# Patient Record
Sex: Female | Born: 1935 | Race: White | Hispanic: No | Smoking: Never smoker
Health system: Southern US, Community
[De-identification: ages and names within clinical notes are randomized; demographics above are authoritative.]

## PROBLEM LIST (undated history)

## (undated) DIAGNOSIS — K219 Gastro-esophageal reflux disease without esophagitis: Secondary | ICD-10-CM

## (undated) DIAGNOSIS — K6389 Other specified diseases of intestine: Secondary | ICD-10-CM

## (undated) DIAGNOSIS — C187 Malignant neoplasm of sigmoid colon: Principal | ICD-10-CM

## (undated) DIAGNOSIS — M533 Sacrococcygeal disorders, not elsewhere classified: Secondary | ICD-10-CM

## (undated) DIAGNOSIS — N329 Bladder disorder, unspecified: Secondary | ICD-10-CM

## (undated) DIAGNOSIS — K5792 Diverticulitis of intestine, part unspecified, without perforation or abscess without bleeding: Secondary | ICD-10-CM

## (undated) DIAGNOSIS — M5136 Other intervertebral disc degeneration, lumbar region: Secondary | ICD-10-CM

## (undated) DIAGNOSIS — M5416 Radiculopathy, lumbar region: Secondary | ICD-10-CM

## (undated) DIAGNOSIS — M47816 Spondylosis without myelopathy or radiculopathy, lumbar region: Secondary | ICD-10-CM

## (undated) DIAGNOSIS — Z96641 Presence of right artificial hip joint: Secondary | ICD-10-CM

## (undated) DIAGNOSIS — G4700 Insomnia, unspecified: Secondary | ICD-10-CM

## (undated) HISTORY — PX: HIP SURGERY: SHX245

## (undated) HISTORY — DX: Radiculopathy, lumbar region: M54.16

## (undated) HISTORY — DX: Gastro-esophageal reflux disease without esophagitis: K21.9

## (undated) HISTORY — DX: Bladder disorder, unspecified: N32.9

## (undated) HISTORY — DX: Diverticulitis of intestine, part unspecified, without perforation or abscess without bleeding: K57.92

## (undated) HISTORY — DX: Malignant neoplasm of sigmoid colon: C18.7

## (undated) HISTORY — DX: Other specified diseases of intestine: K63.89

## (undated) HISTORY — DX: Spondylosis without myelopathy or radiculopathy, lumbar region: M47.816

## (undated) HISTORY — DX: Insomnia, unspecified: G47.00

## (undated) HISTORY — DX: Sacrococcygeal disorders, not elsewhere classified: M53.3

## (undated) HISTORY — PX: LUNG SURGERY: SHX703

## (undated) HISTORY — PX: COLON RESECTION: SHX5231

## (undated) HISTORY — DX: Other intervertebral disc degeneration, lumbar region: M51.36

## (undated) HISTORY — DX: Presence of right artificial hip joint: Z96.641

---

## 2008-11-02 ENCOUNTER — Ambulatory Visit: Payer: Self-pay | Admitting: Urology

## 2008-11-16 ENCOUNTER — Ambulatory Visit: Payer: Self-pay | Admitting: Urology

## 2008-12-15 ENCOUNTER — Ambulatory Visit: Payer: Self-pay | Admitting: Unknown Physician Specialty

## 2009-12-20 ENCOUNTER — Ambulatory Visit: Payer: Self-pay | Admitting: Family Medicine

## 2010-01-11 ENCOUNTER — Ambulatory Visit: Payer: Self-pay | Admitting: Family Medicine

## 2010-03-07 ENCOUNTER — Ambulatory Visit: Payer: Self-pay | Admitting: Family Medicine

## 2010-05-17 ENCOUNTER — Ambulatory Visit: Payer: Self-pay | Admitting: Family Medicine

## 2011-04-23 ENCOUNTER — Ambulatory Visit: Payer: Self-pay | Admitting: Otolaryngology

## 2011-06-27 DIAGNOSIS — K5792 Diverticulitis of intestine, part unspecified, without perforation or abscess without bleeding: Secondary | ICD-10-CM | POA: Insufficient documentation

## 2011-06-27 HISTORY — DX: Diverticulitis of intestine, part unspecified, without perforation or abscess without bleeding: K57.92

## 2011-09-19 ENCOUNTER — Ambulatory Visit: Payer: Self-pay | Admitting: Gastroenterology

## 2011-09-28 ENCOUNTER — Ambulatory Visit: Payer: Self-pay | Admitting: Emergency Medicine

## 2011-09-28 DIAGNOSIS — I10 Essential (primary) hypertension: Secondary | ICD-10-CM

## 2011-10-01 ENCOUNTER — Inpatient Hospital Stay: Payer: Self-pay | Admitting: Emergency Medicine

## 2011-10-02 LAB — CREATININE, SERUM
Creatinine: 0.65 mg/dL (ref 0.60–1.30)
EGFR (African American): >60
EGFR (Non-African Amer.): >60

## 2011-10-04 LAB — CBC WITH DIFFERENTIAL/PLATELET
Eosinophil %: 0.7 %
HCT: 34.5 % — ABNORMAL LOW (ref 35.0–47.0)
Lymphocyte %: 9.9 %
Monocyte #: 1.2 10*3/uL — ABNORMAL HIGH (ref 0.0–0.7)
Monocyte %: 9.6 %
Neutrophil #: 10.1 10*3/uL — ABNORMAL HIGH (ref 1.4–6.5)
Neutrophil %: 79.1 %
Platelet: 299 10*3/uL (ref 150–440)
RDW: 14.4 % (ref 11.5–14.5)
WBC: 12.8 10*3/uL — ABNORMAL HIGH (ref 3.6–11.0)

## 2011-10-04 LAB — PATHOLOGY REPORT

## 2011-10-06 LAB — BASIC METABOLIC PANEL
Anion Gap: 13 (ref 7–16)
BUN: 5 mg/dL — ABNORMAL LOW (ref 7–18)
Creatinine: 0.44 mg/dL — ABNORMAL LOW (ref 0.60–1.30)
EGFR (African American): >60
EGFR (Non-African Amer.): >60
Glucose: 103 mg/dL — ABNORMAL HIGH (ref 65–99)
Osmolality: 279 (ref 275–301)
Potassium: 2.6 mmol/L — ABNORMAL LOW (ref 3.5–5.1)

## 2011-10-06 LAB — CBC WITH DIFFERENTIAL/PLATELET
Basophil #: 0 10*3/uL (ref 0.0–0.1)
Basophil %: 0.3 %
Eosinophil #: 0.4 10*3/uL (ref 0.0–0.7)
Eosinophil %: 4.3 %
HCT: 35.4 % (ref 35.0–47.0)
HGB: 11.7 g/dL — ABNORMAL LOW (ref 12.0–16.0)
Lymphocyte #: 1.3 10*3/uL (ref 1.0–3.6)
Lymphocyte %: 13.4 %
MCH: 28.2 pg (ref 26.0–34.0)
MCHC: 33 g/dL (ref 32.0–36.0)
MCV: 85 fL (ref 80–100)
Monocyte #: 1.2 10*3/uL — ABNORMAL HIGH (ref 0.0–0.7)
Monocyte %: 12.7 %
Neutrophil #: 6.5 10*3/uL (ref 1.4–6.5)
Neutrophil %: 69.3 %
Platelet: 389 10*3/uL (ref 150–440)
RBC: 4.16 10*6/uL (ref 3.80–5.20)
RDW: 14.3 % (ref 11.5–14.5)
WBC: 9.4 10*3/uL (ref 3.6–11.0)

## 2011-10-08 LAB — POTASSIUM: Potassium: 3.7 mmol/L (ref 3.5–5.1)

## 2011-10-10 ENCOUNTER — Inpatient Hospital Stay: Payer: Self-pay | Admitting: Emergency Medicine

## 2011-10-10 LAB — URINALYSIS, COMPLETE
Bacteria: NONE SEEN
Bilirubin,UR: NEGATIVE
Glucose,UR: NEGATIVE mg/dL (ref 0–75)
Hyaline Cast: 1
Ketone: NEGATIVE
Nitrite: NEGATIVE
Squamous Epithelial: 1
WBC UR: 48 /HPF (ref 0–5)

## 2011-10-10 LAB — CBC WITH DIFFERENTIAL/PLATELET
Basophil %: 0.3 %
Eosinophil %: 4.1 %
HCT: 38.5 % (ref 35.0–47.0)
Lymphocyte #: 1.5 10*3/uL (ref 1.0–3.6)
Lymphocyte %: 14.9 %
MCHC: 32.8 g/dL (ref 32.0–36.0)
Monocyte #: 0.9 10*3/uL — ABNORMAL HIGH (ref 0.0–0.7)
Monocyte %: 9.1 %
Neutrophil %: 71.6 %
RBC: 4.5 10*6/uL (ref 3.80–5.20)
RDW: 14.9 % — ABNORMAL HIGH (ref 11.5–14.5)
WBC: 9.8 10*3/uL (ref 3.6–11.0)

## 2011-10-10 LAB — COMPREHENSIVE METABOLIC PANEL
Alkaline Phosphatase: 68 U/L (ref 50–136)
Anion Gap: 15 (ref 7–16)
Calcium, Total: 8.9 mg/dL (ref 8.5–10.1)
Chloride: 101 mmol/L (ref 98–107)
Co2: 22 mmol/L (ref 21–32)
Creatinine: 0.67 mg/dL (ref 0.60–1.30)
EGFR (African American): >60
EGFR (Non-African Amer.): >60
Glucose: 122 mg/dL — ABNORMAL HIGH (ref 65–99)
Osmolality: 276 (ref 275–301)
Potassium: 3.8 mmol/L (ref 3.5–5.1)
SGOT(AST): 31 U/L (ref 15–37)
Sodium: 138 mmol/L (ref 136–145)
Total Protein: 6.9 g/dL (ref 6.4–8.2)

## 2011-10-11 LAB — URINE CULTURE

## 2011-10-16 ENCOUNTER — Ambulatory Visit: Payer: Self-pay | Admitting: Emergency Medicine

## 2011-10-19 ENCOUNTER — Ambulatory Visit: Payer: Self-pay | Admitting: Oncology

## 2011-10-19 LAB — CBC CANCER CENTER
Basophil %: 1.4 %
Eosinophil %: 3.9 %
HGB: 11.9 g/dL — ABNORMAL LOW (ref 12.0–16.0)
Lymphocyte #: 1.2 x10 3/mm (ref 1.0–3.6)
Lymphocyte %: 21.9 %
MCV: 85.8 fL (ref 80–100)
Monocyte %: 10.6 %
Neutrophil #: 3.4 x10 3/mm (ref 1.4–6.5)
RBC: 4.23 10*6/uL (ref 3.80–5.20)
WBC: 5.5 x10 3/mm (ref 3.6–11.0)

## 2011-10-19 LAB — COMPREHENSIVE METABOLIC PANEL
Albumin: 3.3 g/dL — ABNORMAL LOW (ref 3.4–5.0)
Alkaline Phosphatase: 108 U/L (ref 50–136)
Anion Gap: 10 (ref 7–16)
BUN: 13 mg/dL (ref 7–18)
Bilirubin,Total: 0.4 mg/dL (ref 0.2–1.0)
Calcium, Total: 9.4 mg/dL (ref 8.5–10.1)
Creatinine: 0.84 mg/dL (ref 0.60–1.30)
EGFR (African American): >60
EGFR (Non-African Amer.): >60
Glucose: 105 mg/dL — ABNORMAL HIGH (ref 65–99)
Osmolality: 272 (ref 275–301)
Potassium: 4.5 mmol/L (ref 3.5–5.1)
Sodium: 136 mmol/L (ref 136–145)
Total Protein: 7 g/dL (ref 6.4–8.2)

## 2011-10-29 ENCOUNTER — Ambulatory Visit: Payer: Self-pay | Admitting: Oncology

## 2011-12-03 ENCOUNTER — Ambulatory Visit: Payer: Self-pay | Admitting: Oncology

## 2011-12-07 ENCOUNTER — Ambulatory Visit: Payer: Self-pay | Admitting: Oncology

## 2011-12-07 LAB — CBC CANCER CENTER
Basophil #: 0.1 x10 3/mm (ref 0.0–0.1)
Eosinophil %: 5.6 %
Lymphocyte #: 1.8 x10 3/mm (ref 1.0–3.6)
Lymphocyte %: 25.5 %
MCH: 27.3 pg (ref 26.0–34.0)
MCHC: 32.5 g/dL (ref 32.0–36.0)
MCV: 84 fL (ref 80–100)
Neutrophil #: 4.1 x10 3/mm (ref 1.4–6.5)
Neutrophil %: 57.3 %
RDW: 15.6 % — ABNORMAL HIGH (ref 11.5–14.5)

## 2011-12-07 LAB — COMPREHENSIVE METABOLIC PANEL
Albumin: 3.4 g/dL (ref 3.4–5.0)
Alkaline Phosphatase: 68 U/L (ref 50–136)
Bilirubin,Total: 0.6 mg/dL (ref 0.2–1.0)
Calcium, Total: 8.9 mg/dL (ref 8.5–10.1)
Chloride: 104 mmol/L (ref 98–107)
Co2: 24 mmol/L (ref 21–32)
Creatinine: 0.81 mg/dL (ref 0.60–1.30)
EGFR (African American): >60
Glucose: 105 mg/dL — ABNORMAL HIGH (ref 65–99)
SGPT (ALT): 18 U/L
Sodium: 139 mmol/L (ref 136–145)

## 2011-12-09 LAB — CEA: CEA: 1.4 ng/mL (ref 0.0–4.7)

## 2011-12-29 ENCOUNTER — Ambulatory Visit: Payer: Self-pay | Admitting: Oncology

## 2012-02-13 ENCOUNTER — Ambulatory Visit: Payer: Self-pay | Admitting: Urology

## 2012-02-27 ENCOUNTER — Ambulatory Visit: Payer: Self-pay | Admitting: Emergency Medicine

## 2012-03-28 ENCOUNTER — Ambulatory Visit: Payer: Self-pay | Admitting: Oncology

## 2012-03-28 LAB — COMPREHENSIVE METABOLIC PANEL
Albumin: 3.4 g/dL (ref 3.4–5.0)
Alkaline Phosphatase: 80 U/L (ref 50–136)
Anion Gap: 6 — ABNORMAL LOW (ref 7–16)
BUN: 14 mg/dL (ref 7–18)
Bilirubin,Total: 0.6 mg/dL (ref 0.2–1.0)
Calcium, Total: 9.1 mg/dL (ref 8.5–10.1)
Chloride: 105 mmol/L (ref 98–107)
Co2: 25 mmol/L (ref 21–32)
Creatinine: 0.76 mg/dL (ref 0.60–1.30)
EGFR (African American): >60
EGFR (Non-African Amer.): >60
Osmolality: 272 (ref 275–301)
Potassium: 4 mmol/L (ref 3.5–5.1)
Sodium: 136 mmol/L (ref 136–145)

## 2012-03-28 LAB — CBC CANCER CENTER
Eosinophil #: 0.4 x10 3/mm (ref 0.0–0.7)
Eosinophil %: 6.3 %
HCT: 36.6 % (ref 35.0–47.0)
MCH: 28.6 pg (ref 26.0–34.0)
MCHC: 33 g/dL (ref 32.0–36.0)
MCV: 87 fL (ref 80–100)
Monocyte #: 0.7 x10 3/mm (ref 0.2–0.9)
Monocyte %: 11.1 %
Neutrophil #: 3.5 x10 3/mm (ref 1.4–6.5)
Platelet: 257 x10 3/mm (ref 150–440)
RDW: 14.6 % — ABNORMAL HIGH (ref 11.5–14.5)
WBC: 6.2 x10 3/mm (ref 3.6–11.0)

## 2012-03-30 ENCOUNTER — Ambulatory Visit: Payer: Self-pay | Admitting: Oncology

## 2012-04-08 LAB — LIPID PANEL
HDL Cholesterol: 53 mg/dL (ref 40–60)
Triglycerides: 160 mg/dL (ref 0–200)
VLDL Cholesterol, Calc: 32 mg/dL (ref 5–40)

## 2012-04-17 DIAGNOSIS — R300 Dysuria: Secondary | ICD-10-CM | POA: Insufficient documentation

## 2012-04-17 DIAGNOSIS — R3129 Other microscopic hematuria: Secondary | ICD-10-CM | POA: Insufficient documentation

## 2012-04-29 ENCOUNTER — Ambulatory Visit: Payer: Self-pay | Admitting: Oncology

## 2012-04-29 ENCOUNTER — Ambulatory Visit: Payer: Self-pay

## 2012-04-29 DIAGNOSIS — N329 Bladder disorder, unspecified: Secondary | ICD-10-CM | POA: Insufficient documentation

## 2012-04-29 DIAGNOSIS — N952 Postmenopausal atrophic vaginitis: Secondary | ICD-10-CM | POA: Insufficient documentation

## 2012-04-29 HISTORY — DX: Bladder disorder, unspecified: N32.9

## 2012-05-26 DIAGNOSIS — K219 Gastro-esophageal reflux disease without esophagitis: Secondary | ICD-10-CM | POA: Insufficient documentation

## 2012-05-26 HISTORY — DX: Gastro-esophageal reflux disease without esophagitis: K21.9

## 2012-08-12 ENCOUNTER — Ambulatory Visit: Payer: Self-pay | Admitting: Internal Medicine

## 2012-08-12 LAB — HEPATIC FUNCTION PANEL A (ARMC)
Bilirubin, Direct: 0.2 mg/dL (ref 0.00–0.20)
SGOT(AST): 17 U/L (ref 15–37)
SGPT (ALT): 24 U/L (ref 12–78)

## 2012-08-30 ENCOUNTER — Ambulatory Visit: Payer: Self-pay | Admitting: Internal Medicine

## 2012-10-07 DIAGNOSIS — K6389 Other specified diseases of intestine: Secondary | ICD-10-CM | POA: Insufficient documentation

## 2012-10-07 HISTORY — DX: Other specified diseases of intestine: K63.89

## 2012-10-17 ENCOUNTER — Ambulatory Visit: Payer: Self-pay | Admitting: Oncology

## 2012-10-17 LAB — CBC CANCER CENTER
Basophil #: 0.1 x10 3/mm (ref 0.0–0.1)
Basophil %: 2.2 %
Eosinophil #: 0.4 x10 3/mm (ref 0.0–0.7)
Eosinophil %: 6.4 %
HCT: 36.5 % (ref 35.0–47.0)
HGB: 12.2 g/dL (ref 12.0–16.0)
Lymphocyte #: 1.7 x10 3/mm (ref 1.0–3.6)
MCH: 28.2 pg (ref 26.0–34.0)
Monocyte #: 0.6 x10 3/mm (ref 0.2–0.9)
Neutrophil #: 2.9 x10 3/mm (ref 1.4–6.5)
Neutrophil %: 50.7 %
Platelet: 325 x10 3/mm (ref 150–440)
RDW: 13.7 % (ref 11.5–14.5)
WBC: 5.7 x10 3/mm (ref 3.6–11.0)

## 2012-10-17 LAB — COMPREHENSIVE METABOLIC PANEL
Alkaline Phosphatase: 79 U/L (ref 50–136)
BUN: 25 mg/dL — ABNORMAL HIGH (ref 7–18)
Calcium, Total: 9.1 mg/dL (ref 8.5–10.1)
EGFR (African American): >60
EGFR (Non-African Amer.): >60
Glucose: 98 mg/dL (ref 65–99)
Osmolality: 291 (ref 275–301)
SGOT(AST): 16 U/L (ref 15–37)
SGPT (ALT): 17 U/L (ref 12–78)
Sodium: 144 mmol/L (ref 136–145)

## 2012-10-28 ENCOUNTER — Ambulatory Visit: Payer: Self-pay | Admitting: Oncology

## 2013-01-15 ENCOUNTER — Ambulatory Visit: Payer: Self-pay | Admitting: Family Medicine

## 2013-02-12 ENCOUNTER — Ambulatory Visit: Payer: Self-pay | Admitting: General Practice

## 2013-03-07 ENCOUNTER — Ambulatory Visit: Payer: Self-pay | Admitting: Internal Medicine

## 2013-04-03 ENCOUNTER — Ambulatory Visit: Payer: Self-pay | Admitting: Oncology

## 2013-04-03 LAB — CBC CANCER CENTER
Basophil %: 1.7 %
Eosinophil #: 0.3 x10 3/mm (ref 0.0–0.7)
HCT: 39.2 % (ref 35.0–47.0)
Lymphocyte #: 1.7 x10 3/mm (ref 1.0–3.6)
Lymphocyte %: 29.9 %
MCHC: 33 g/dL (ref 32.0–36.0)
MCV: 87 fL (ref 80–100)
Monocyte %: 10.7 %
Neutrophil #: 2.9 x10 3/mm (ref 1.4–6.5)
Neutrophil %: 51.8 %
Platelet: 290 x10 3/mm (ref 150–440)
RBC: 4.52 10*6/uL (ref 3.80–5.20)
RDW: 14.1 % (ref 11.5–14.5)

## 2013-04-03 LAB — COMPREHENSIVE METABOLIC PANEL
Alkaline Phosphatase: 75 U/L (ref 50–136)
Anion Gap: 8 (ref 7–16)
Bilirubin,Total: 0.6 mg/dL (ref 0.2–1.0)
Creatinine: 0.77 mg/dL (ref 0.60–1.30)
EGFR (African American): >60
EGFR (Non-African Amer.): >60
Osmolality: 284 (ref 275–301)
Potassium: 4.4 mmol/L (ref 3.5–5.1)
SGOT(AST): 15 U/L (ref 15–37)
SGPT (ALT): 18 U/L (ref 12–78)
Sodium: 142 mmol/L (ref 136–145)
Total Protein: 7.4 g/dL (ref 6.4–8.2)

## 2013-04-04 LAB — CEA: CEA: 1.8 ng/mL (ref 0.0–4.7)

## 2013-04-29 ENCOUNTER — Ambulatory Visit: Payer: Self-pay | Admitting: Oncology

## 2013-05-05 ENCOUNTER — Ambulatory Visit: Payer: Self-pay | Admitting: Family Medicine

## 2013-08-10 DIAGNOSIS — E7800 Pure hypercholesterolemia, unspecified: Secondary | ICD-10-CM | POA: Insufficient documentation

## 2013-08-12 DIAGNOSIS — Z0289 Encounter for other administrative examinations: Secondary | ICD-10-CM | POA: Insufficient documentation

## 2013-10-14 ENCOUNTER — Ambulatory Visit: Payer: Self-pay | Admitting: Oncology

## 2013-10-16 LAB — COMPREHENSIVE METABOLIC PANEL
ALK PHOS: 83 U/L
Albumin: 3.4 g/dL (ref 3.4–5.0)
Anion Gap: 7 (ref 7–16)
BILIRUBIN TOTAL: 0.3 mg/dL (ref 0.2–1.0)
BUN: 20 mg/dL — ABNORMAL HIGH (ref 7–18)
CO2: 28 mmol/L (ref 21–32)
Calcium, Total: 7.6 mg/dL — ABNORMAL LOW (ref 8.5–10.1)
Chloride: 106 mmol/L (ref 98–107)
Creatinine: 0.74 mg/dL (ref 0.60–1.30)
Glucose: 93 mg/dL (ref 65–99)
OSMOLALITY: 284 (ref 275–301)
Potassium: 3.3 mmol/L — ABNORMAL LOW (ref 3.5–5.1)
SGOT(AST): 17 U/L (ref 15–37)
SGPT (ALT): 23 U/L (ref 12–78)
SODIUM: 141 mmol/L (ref 136–145)
Total Protein: 6.8 g/dL (ref 6.4–8.2)

## 2013-10-16 LAB — CBC CANCER CENTER
BASOS PCT: 1.4 %
Basophil #: 0.1 x10 3/mm (ref 0.0–0.1)
EOS ABS: 0.4 x10 3/mm (ref 0.0–0.7)
EOS PCT: 7.5 %
HCT: 38.5 % (ref 35.0–47.0)
HGB: 12.8 g/dL (ref 12.0–16.0)
LYMPHS ABS: 1.6 x10 3/mm (ref 1.0–3.6)
LYMPHS PCT: 26.9 %
MCH: 28.8 pg (ref 26.0–34.0)
MCHC: 33.3 g/dL (ref 32.0–36.0)
MCV: 87 fL (ref 80–100)
MONO ABS: 0.6 x10 3/mm (ref 0.2–0.9)
MONOS PCT: 10.1 %
NEUTROS ABS: 3.3 x10 3/mm (ref 1.4–6.5)
NEUTROS PCT: 54.1 %
Platelet: 291 x10 3/mm (ref 150–440)
RBC: 4.44 10*6/uL (ref 3.80–5.20)
RDW: 13.6 % (ref 11.5–14.5)
WBC: 6 x10 3/mm (ref 3.6–11.0)

## 2013-10-19 LAB — CEA: CEA: 2.2 ng/mL (ref 0.0–4.7)

## 2013-10-28 ENCOUNTER — Ambulatory Visit: Payer: Self-pay | Admitting: Oncology

## 2013-11-16 ENCOUNTER — Ambulatory Visit: Payer: Self-pay | Admitting: Gastroenterology

## 2014-04-16 ENCOUNTER — Ambulatory Visit: Payer: Self-pay | Admitting: Oncology

## 2014-04-30 ENCOUNTER — Ambulatory Visit: Payer: Self-pay | Admitting: Oncology

## 2014-04-30 LAB — CBC CANCER CENTER
BASOS ABS: 0.1 x10 3/mm (ref 0.0–0.1)
BASOS PCT: 2.3 %
EOS ABS: 0.4 x10 3/mm (ref 0.0–0.7)
EOS PCT: 7.5 %
HCT: 42.6 % (ref 35.0–47.0)
HGB: 13.9 g/dL (ref 12.0–16.0)
Lymphocyte #: 1.3 x10 3/mm (ref 1.0–3.6)
Lymphocyte %: 24.7 %
MCH: 28.7 pg (ref 26.0–34.0)
MCHC: 32.7 g/dL (ref 32.0–36.0)
MCV: 88 fL (ref 80–100)
Monocyte #: 0.7 x10 3/mm (ref 0.2–0.9)
Monocyte %: 12.4 %
NEUTROS ABS: 2.9 x10 3/mm (ref 1.4–6.5)
Neutrophil %: 53.1 %
PLATELETS: 293 x10 3/mm (ref 150–440)
RBC: 4.84 10*6/uL (ref 3.80–5.20)
RDW: 14.3 % (ref 11.5–14.5)
WBC: 5.4 x10 3/mm (ref 3.6–11.0)

## 2014-04-30 LAB — COMPREHENSIVE METABOLIC PANEL
ALBUMIN: 3.9 g/dL (ref 3.4–5.0)
ANION GAP: 10 (ref 7–16)
Alkaline Phosphatase: 78 U/L
BILIRUBIN TOTAL: 0.8 mg/dL (ref 0.2–1.0)
BUN: 12 mg/dL (ref 7–18)
CALCIUM: 9.3 mg/dL (ref 8.5–10.1)
CHLORIDE: 100 mmol/L (ref 98–107)
CO2: 26 mmol/L (ref 21–32)
CREATININE: 0.85 mg/dL (ref 0.60–1.30)
EGFR (Non-African Amer.): >60
Glucose: 97 mg/dL (ref 65–99)
Osmolality: 272 (ref 275–301)
Potassium: 3.9 mmol/L (ref 3.5–5.1)
SGOT(AST): 15 U/L (ref 15–37)
SGPT (ALT): 18 U/L
SODIUM: 136 mmol/L (ref 136–145)
Total Protein: 7.8 g/dL (ref 6.4–8.2)

## 2014-05-03 LAB — CEA: CEA: 1.9 ng/mL (ref 0.0–4.7)

## 2014-05-30 ENCOUNTER — Ambulatory Visit: Payer: Self-pay | Admitting: Oncology

## 2014-06-29 ENCOUNTER — Ambulatory Visit: Payer: Self-pay | Admitting: General Practice

## 2014-09-27 ENCOUNTER — Ambulatory Visit: Payer: Self-pay | Admitting: Pain Medicine

## 2014-10-11 ENCOUNTER — Ambulatory Visit: Payer: Self-pay | Admitting: Pain Medicine

## 2014-11-09 ENCOUNTER — Ambulatory Visit
Admit: 2014-11-09 | Disposition: A | Discharge status: Home / Self Care | Payer: Self-pay | Attending: Pain Medicine | Admitting: Pain Medicine

## 2014-11-10 ENCOUNTER — Ambulatory Visit
Admit: 2014-11-10 | Disposition: A | Discharge status: Home / Self Care | Payer: Self-pay | Attending: Hematology and Oncology | Admitting: Hematology and Oncology

## 2014-11-10 LAB — COMPREHENSIVE METABOLIC PANEL
Albumin: 4 g/dL
Alkaline Phosphatase: 67 U/L
Anion Gap: 6 — ABNORMAL LOW (ref 7–16)
BUN: 18 mg/dL
Bilirubin,Total: 0.6 mg/dL
Calcium, Total: 9 mg/dL
Chloride: 103 mmol/L
Co2: 27 mmol/L
Creatinine: 0.61 mg/dL
EGFR (African American): >60
EGFR (Non-African Amer.): >60
Glucose: 112 mg/dL — ABNORMAL HIGH
Potassium: 3.8 mmol/L
SGOT(AST): 16 U/L
SGPT (ALT): 15 U/L
Sodium: 136 mmol/L
Total Protein: 7 g/dL

## 2014-11-10 LAB — CBC CANCER CENTER
Basophil #: 0.1 x10 3/mm (ref 0.0–0.1)
Basophil %: 1.4 %
Eosinophil #: 0.3 x10 3/mm (ref 0.0–0.7)
Eosinophil %: 3.5 %
HCT: 39.6 % (ref 35.0–47.0)
HGB: 13 g/dL (ref 12.0–16.0)
Lymphocyte #: 1.3 x10 3/mm (ref 1.0–3.6)
Lymphocyte %: 17.3 %
MCH: 28.5 pg (ref 26.0–34.0)
MCHC: 32.9 g/dL (ref 32.0–36.0)
MCV: 87 fL (ref 80–100)
Monocyte #: 0.7 x10 3/mm (ref 0.2–0.9)
Monocyte %: 9.5 %
Neutrophil #: 5.2 x10 3/mm (ref 1.4–6.5)
Neutrophil %: 68.3 %
Platelet: 303 x10 3/mm (ref 150–440)
RBC: 4.57 10*6/uL (ref 3.80–5.20)
RDW: 14 % (ref 11.5–14.5)
WBC: 7.6 x10 3/mm (ref 3.6–11.0)

## 2014-11-11 LAB — CEA: CEA: 2.4 ng/mL (ref 0.0–4.7)

## 2014-11-15 ENCOUNTER — Ambulatory Visit
Admit: 2014-11-15 | Disposition: A | Discharge status: Home / Self Care | Payer: Self-pay | Attending: Pain Medicine | Admitting: Pain Medicine

## 2014-11-21 NOTE — Op Note (Signed)
PATIENT NAME:  Marilyn Romero, Marilyn Romero MR#:  [id number] DATE OF BIRTH:  05/26/1934  DATE OF PROCEDURE:  10/01/2011  PREOPERATIVE DIAGNOSIS: Sigmoid colon cancer.   POSTOPERATIVE DIAGNOSIS: Sigmoid colon cancer.   PROCEDURES:  1. Exploratory laparotomy with lysis of small bowel adhesions.  2. Repair of multiple small bowel lacerations due to adhesion take down. 3. Sigmoid colon resection with anastomosis due to sigmoid colon tumor that was almost obstructing.  4. Left oophorectomy.  SURGEON: Vella Kohler, MD  INDICATION FOR SURGERY: This is a patient who recently had a colonoscopy done by Dr. Dionne Milo and it showed an obstructive lesion at the sigmoid colon, at about 30 centimeter. The patient was having a lot of abdominal pain and issues with bowel obstruction.   DESCRIPTION OF PROCEDURE: Before surgery I went over with them the indication for surgery as well as complications of the surgery, namely leakage of the anastomosis  and formation of colostomy, etc. The patient was then brought to surgery. Under general anesthesia, the abdomen was then prepped and draped. A Foley catheter was inserted. An upper abdominal incision was made in the midline, from the previous hysterectomy scar. The patient also had multiple scars of the abdomen, also very large scar on the right upper quadrant of the abdomen because of previous cholecystectomy. After cutting skin and subcutaneous tissue, the fascia was then cut. After opening the peritoneum, the patient was found to have massive small bowel adhesions, especially they were in the pelvis because of previous hysterectomy. One by one these small bowel adhesions were then taken down. A part of the small bowel was also stuck to the colon. It looked like this lady might have in the past attacks of diverticulitis. The proximal colon was mildly dilated. The distal was not dilated. I could feel the tumor in the sigmoid colon near the pelvic brim, which dissecting it out I brought  it up to the medial part of the incision. First of all all the small bowel adhesions were released. They were stuck down into the bladder flap area and also into the right side iliac region. I made some holes in four or five places in the small bowel. The seromuscular layers were then sutured with 4-0 silk sutures, interrupted sutures were applied. There was really no spillage of small bowel content into the abdomen. After we got all the small bowel out, it was hard to feel the right upper quadrant of the abdomen because the patient had previous adhesions of the colon and there was really no way to enter into the liver, but grossly I did not see anything in the liver. A CAT scan of the liver was also negative. First of all, the colon was held up with Babcock clamp and pushed medially and dissection was done over the line of Toldt and went towards the splenic flexure. The splenic flexure was then released.  After mobilizing the sigmoid colon enough, the distal part was then stapled and the mesocolon was then clamped, cut and tied and this portion of sigmoid colon was then removed. After that anastomosis was then brought down. We made a little enterotomy on the tenia of the colon and the EEA was then put through it. Then at the lower end of the rectosigmoid area, the anvil of the EEA was then put in there. With a pursestring suture, after fire of the anvil, posteriorly the patient did have a little bit of diverticulitis and there was a little hole in the bowel. I manually  repaired it with silk sutures, multiple 3-0 silk multiple were then applied,. and after that I rechecked the anastomosis for any leak and there was no leak. I closed with the colotomy with two layer closure, with 3-0 Vicryl sutures, in the inner layer, and the outer layer with interrupted 3-0 silk sutures. The omentum was then put on top of it. After this was done, irrigation of the abdomen was then completely performed. I found the left ovary was  quite stuck to the colon and it was a cancer, so I did a left oophorectomy also. It was a small ovary and I removed that also. On the right side, I could not see because of multiple adhesions. After irrigating the abdomen, the patient had a lot of oozing from the raw surfaces because of the adhesions and because of mobilization of the colon. I put a little drain there to let it drain out laterally to the abdominal wall, with a #10 JP drain, and I put it on suction. I made sure all sponge counts were correct, all instruments were correct. The abdomen was closed with interrupted 0 Vicryl sutures, all the way, and the skin was closed with staples. The patient tolerated the procedure well and was sent to the recovery room in satisfactory condition.  ____________________________ Welford Roche Phylis Bougie, MD msh:slb D: 10/01/2011 12:53:14 ET T: 10/01/2011 13:04:14 ET JOB#: [id number]  cc: Giorgio Chabot S. Phylis Bougie, MD, <Dictator> Floria Raveling. Astrid Divine, MD Jill Side, MD Wendee Copp Sherilyn Banker MD ELECTRONICALLY SIGNED 10/02/2011 13:16

## 2014-11-21 NOTE — Discharge Summary (Signed)
PATIENT NAME:  Marilyn Romero, Marilyn Romero MR#:  [id number] DATE OF BIRTH:  07-30-1934  DATE OF ADMISSION:  10/10/2011 DATE OF DISCHARGE:  10/12/2011  HISTORY OF PRESENT ILLNESS:  This patient recently had cancer colon surgery performed and she went home and came back again with abdominal pain. She had a CT scan done in the Emergency Room which did not show any abscess or any disruption of anastomosis.  Since the patient was complaining of a lot of pain, the patient was then admitted to the hospital. I was going out of town. Dr. Jamal Collin was taking care of her in the [place]:  She was admitted and pain medication was given. She was given IV fluids and she was given Nexium, tramadol, and amlodipine 10 mg orally which she takes every day. Her condition improved over the days and she started moving her bowels. She started eating well.  She was then discharged from the hospital and she was told to call Dr. Sherilyn Banker office for an appointment when I come back from vacation.   ____________________________ Welford Roche Phylis Bougie, MD msh:bjt D: 10/30/2011 10:04:13 ET T: 10/30/2011 10:34:39 ET JOB#: [id number]  cc: Simrit Gohlke S. Phylis Bougie, MD, <Dictator> Sharene Butters MD ELECTRONICALLY SIGNED 11/01/2011 11:54

## 2014-11-21 NOTE — Consult Note (Signed)
Present Illness 81 year old female with known cardiovascular risk factors including hypertension and hyperlipidemia who has had a colectomy due to significant cancer.  The patient has had a preoperative assessment by EKG showing normal sinus rhythm with rare preventricular contraction and nonspecific ST and T-wave changes.  She now.   from her surgery without any hemodynamic compromise having very frequent preventricular contractions and a ventricular triplet with an EKG showing normal sinus rhythm, frequent preventricular contractions and nonspecific ST and T-wave changes.  .  The patient does not have any clinical symptoms at this time of chest pain, pressure or shortness of breath.  The patient's blood pressure slightly elevated in the 578 systolic range.  She is typically been on amlodipine for her blood pressure and previously was controlled.  She now has no evidence of congestive heart failure or respiratory failure by exam  Family history No family members with early onset of cardiovascular disease  Social history Patient currently denies alcohol or tobacco use   Physical Exam:   GEN WD    HEENT pink conjunctivae    NECK supple    RESP normal resp effort  crackles    CARD Irregular rate and rhythm  Murmur    Murmur Systolic    Systolic Murmur Out flow    ABD denies tenderness  soft    LYMPH negative neck    EXTR negative cyanosis/clubbing    SKIN No rashes    NEURO cranial nerves intact    PSYCH alert   Review of Systems:   Subjective/Chief Complaint patient is somewhat sedated from surgery    ROS Pt not able to provide ROS    Medications/Allergies Reviewed Medications/Allergies reviewed        Admit Diagnosis:   LOW COLON RESECTION 44145: 01-Oct-2011, Active, LOW COLON RESECTION 44145      Admit Reason:   Malignant neoplasm of sigmoid colon: (153.3) 01-Oct-2011, Active, ICD9, Malignant neoplasm of sigmoid colon  Home Medications: Medication Instructions  Status  tramadol 50 mg oral tablet tab(s) orally  as needed Active  Nexium 40 mg oral delayed release capsule 1 cap(s) orally once a day AM Active  amlodipine 10 mg oral tablet 1 tab(s) orally once a day AM Active   EKG:   EKG Interp. by me    Interpretation normal sinus rhythm with preventricular contractions and nonspecific ST and T-wave changes    Metronidazole: Dizzy/Fainting  Vital Signs/Nurse's Notes: **Vital Signs.:   04-Mar-13 14:05   Vital Signs Type Post-Op   Temperature Temperature (F) 97.4   Celsius 36.3   Temperature Source oral   Pulse Pulse 87   Pulse source per Dinamap   Respirations Respirations 18   Systolic BP Systolic BP 469   Diastolic BP (mmHg) Diastolic BP (mmHg) 70   Mean BP 85   BP Source Dinamap   Pulse Ox % Pulse Ox % 97   Pulse Ox Activity Level  At rest   Oxygen Delivery 2L     Impression 81 year old female with hypertension, borderline hyperlipi, status post abdominal surgery with frequent preventricular contractions and a rare ventricular triplet with no current symptoms of congestive heart failure or angina with an abnormal EKG    Plan 1.  Continue telemetry monitoring through her postoperative period for the next several hours, watching closely for improvement. 2.  Likely cause of increased ectopy is due to pain and anesthesia. 3.  No restrictions to recovery in medical regimen. 4.  Serial ECG tomorrow or the next  day to assess changes and further need for other diagnostic testing including echocardiogram. 5.  Continue amlodipine for hypertension control. 6.  No intervention of lipid management at this time   Electronic Signatures: Corey Skains (MD)  (Signed 04-Mar-13 14:42)  Authored: General Aspect/Present Illness, History and Physical Exam, Review of System, Health Issues, Home Medications, EKG , Allergies, Vital Signs/Nurse's Notes, Impression/Plan   Last Updated: 04-Mar-13 14:42 by Corey Skains (MD)

## 2014-11-21 NOTE — H&P (Signed)
Subjective/Chief Complaint abdominal pain    History of Present Illness pt had colon resction done last week went home came bac in the morning with abdominal pain, Ct scan does not show any fluid collection or abcess formation.    Past History abdominal hystrectomy and irregular heart beat   Past Med/Surgical Hx:  Colon Resection:   Hip Surgery - Right:   ALLERGIES:  Metronidazole: Dizzy/Fainting  HOME MEDICATIONS: Medication Instructions Status  Nexium 40 mg oral delayed release capsule 1 cap(s) orally once a day AM Active  amlodipine 10 mg oral tablet 1 tab(s) orally once a day AM Active  tramadol 50 mg oral tablet 1 tab(s) orally every 4 hours, As Needed- for Pain  Active   Review of Systems:   Fever/Chills No    Cough No    Sputum No    Abdominal Pain Yes    Diarrhea No    Constipation No    Nausea/Vomiting Yes    SOB/DOE No    Chest Pain No    Tolerating Diet started clear liquids   Physical Exam:   NECK supple    RESP normal resp effort    CARD regular rate    ABD positive tenderness    GU usual post op tender ness    NEURO normal    Additional Comments once she starts moving bowels home again   Routine Hem:  13-Mar-13 01:44    WBC (CBC) 9.8   RBC (CBC) 4.50   Hemoglobin (CBC) 12.6   Hematocrit (CBC) 38.5   Platelet Count (CBC) 540   MCV 86   MCH 28.1   MCHC 32.8   RDW 14.9   Neutrophil % 71.6   Lymphocyte % 14.9   Monocyte % 9.1   Eosinophil % 4.1   Basophil % 0.3   Neutrophil # 7.0   Lymphocyte # 1.5   Monocyte # 0.9   Eosinophil # 0.4   Basophil # 0.0  Routine Chem:  13-Mar-13 01:44    Glucose, Serum 122   BUN 11   Creatinine (comp) 0.67   Sodium, Serum 138   Potassium, Serum 3.8   Chloride, Serum 101   CO2, Serum 22   Calcium (Total), Serum 8.9  Hepatic:  13-Mar-13 01:44    Bilirubin, Total 0.5   Alkaline Phosphatase 68   SGPT (ALT) 28   SGOT (AST) 31   Total Protein, Serum 6.9   Albumin, Serum 3.1  Routine  Chem:  13-Mar-13 01:44    Osmolality (calc) 276   eGFR (African American) >60   eGFR (Non-African American) >60   Anion Gap 15  Routine Coag:  13-Mar-13 01:44    Prothrombin 12.0   INR 0.9  Routine UA:  13-Mar-13 01:44    Color (UA) Yellow   Clarity (UA) Cloudy   Glucose (UA) Negative   Bilirubin (UA) Negative   Ketones (UA) Negative   Specific Gravity (UA) 1.012   Blood (UA) 1+   pH (UA) 7.0   Protein (UA) Negative   Nitrite (UA) Negative   Leukocyte Esterase (UA) 3+   RBC (UA) 9 /HPF   WBC (UA) 48 /HPF   Epithelial Cells (UA) 1 /HPF   Mucous (UA) PRESENT   Hyaline Cast (UA) 1 /LPF   Calcium Oxalate Crystal (UA) PRESENT  Lab:  13-Mar-13 05:10    Lactic Acid, Cardiopulmonary 2.0   Radiology Results: CT:    20-Feb-13 10:07, CT Abdomen and Pelvis With Contrast   CT  Abdomen and Pelvis With Contrast   REASON FOR EXAM:    abd pain  COMMENTS:       PROCEDURE: MCT - MCT ABDOMEN / PELVIS W  - Sep 19 2011 10:07AM     RESULT: History: Abdominal pain    Comparison:  None    Technique: Multiple axial images of the abdomen and pelvis were performed   from the lung bases to the pubic symphysis, with p.o. contrast and with   100 ml of Isovue 370 intravenous contrast.    Findings:    The lung bases are clear. There is no pneumothorax. The heart size is   normal.     The liver demonstrates no focal abnormality. There is no intrahepatic or   extrahepatic biliary ductal dilatation. The gallbladder is unremarkable.   The spleen demonstrates no focal abnormality. The kidneys, adrenal   glands, and pancreas are normal. The bladder is unremarkable.     The stomach, duodenum, small intestine, and large intestine demonstrate   no contrast extravasation or dilatation. There is a focal area of short   segment bowel wall thickening with minimal adjacent haziness in the   surrounding fat. The appearance is concerning for malignancy versus less   likely colitis. Recommend further  evaluation with colonoscopy. There is   diverticulosis without evidence of diverticulitis. There is no   pneumoperitoneum, pneumatosis, or portal venous gas. There is no     abdominal or pelvic free fluid. There is no lymphadenopathy.     The abdominal aorta is normal in caliber with atherosclerosis.    There is a right hip arthroplasty.    IMPRESSION:     There is a focal area of short segment bowel wall thickening with minimal   adjacent haziness in the surrounding fat. The appearance is concerning   for malignancy versus less likely colitis. Recommend further evaluation   with colonoscopy.          Verified By: Jennette Banker, M.D., MD    13-Mar-13 06:47, CT Abdomen and Pelvis With Contrast   CT Abdomen and Pelvis With Contrast   REASON FOR EXAM:    (1) pain, post op; (2) pain post op  COMMENTS:       PROCEDURE: CT  - CT ABDOMEN / PELVIS  W  - Oct 10 2011  6:47AM     RESULT: Comparison:  09/19/2011    Technique: Multiple axial images of the abdomen and pelvis were performed   from the lung bases to the pubic symphysis, with p.o. contrast and with   100 mL of Isovue 300 intravenous contrast.    Findings:  Mild basilar opacities are likely secondary to atelectasis. There is mild   intrahepatic biliary ductal dilatation whichis similar to prior and    likely related to prior cholecystectomy. Minimal low-attenuation along     the falciform ligament likely represents focal fatty deposition. The   spleen, adrenals, and pancreas are unremarkable. Low-attenuation lesion   in the left kidney is consistent with a cyst. Other small attenuation   lesions are too small to characterize.    There is a small amount of free fluid in the abdomen and pelvis. The   small and large bowel are normal in caliber. There is a bowel suture line   in the sigmoid colon in the left hemiabdomen. Mild adjacent stranding is   likely postoperative. No extraluminal fluid collection or extraluminal    air identified. There is a mild diverticulosis of the  colon, just   proximal to the suture line.There is diverticulosis of the descending   colon. Mild thickening of the transverse colon is felt to be secondary to   underdistention. There are a few loops of mildly distended small bowel   containing oral contrast and the upper abdomen, without definite no   discrete transition point. The appendix is not visualized. There is mild     wall thickening of several loops of small bowel in the lower abdomen and   pelvis. There is a moderate amount of air within the bladder. The patient   is status post hysterectomy. The celiac artery, SMA, and IMA are patent.    No aggressive lytic or sclerotic osseous lesions are identified.    IMPRESSION:   1. Minimal stranding surrounding the colonic suture line in the left   lower quadrant is likely postoperative. No extraluminal fluid collection   identified.   2. There is bowel wall thickening of several loops of small bowel in the   pelvis and lower abdomen. This is nonspecific. Differential includes   infectious and inflammatory processes. Ischemia is not excluded.  3. Mild proximal small bowel distention without definite discrete   transition point. A partial or early small bowel obstruction is not     excluded. Followup radiographs could be performed, as indicated.  4. There is a moderate amount of air in the bladder. Correlate for recent   catheterization. In the lack of such a setting, this is concerning for   infection or a fistulous connection with bowel.  5. Mild amount of ascites, which is nonspecific.          Verified By: Gregor Hams, M.D., MD     Assessment/Admission Diagnosis clear liquids today    Plan to be followed by Dr Jamal Collin   Electronic Signatures: Vella Kohler (MD)  (Signed 13-Mar-13 13:01)  Authored: CHIEF COMPLAINT and HISTORY, PAST MEDICAL/SURGIAL HISTORY, ALLERGIES, HOME MEDICATIONS, REVIEW OF SYSTEMS, PHYSICAL  EXAM, LABS, Radiology, ASSESSMENT AND PLAN   Last Updated: 13-Mar-13 13:01 by Vella Kohler (MD)

## 2014-11-29 ENCOUNTER — Telehealth: Payer: Self-pay | Admitting: *Deleted

## 2014-11-29 NOTE — Telephone Encounter (Signed)
States she was told she would get a letter mailed with CXR results and she has not received anything yet. Wants results

## 2014-11-29 NOTE — Telephone Encounter (Signed)
Pt notified of normal CXR and pt wants to proceed with chest CT.

## 2014-11-29 NOTE — Telephone Encounter (Signed)
Marilyn Romero,  Please tell patient CXR looks fine, but radiologist says if we have a concern, we could do a chest CT. This will look at things in a detailed way. Marilyn Romero

## 2014-12-06 ENCOUNTER — Telehealth: Payer: Self-pay | Admitting: Hematology and Oncology

## 2014-12-06 NOTE — Telephone Encounter (Signed)
She also wanted you to know that she is going out of [place] on [place] and returning around June 7th or 8th. She would like to do the scan before she leaves if possible.

## 2014-12-06 NOTE — Telephone Encounter (Signed)
Patient called to find out when her scan is. She said Dr. Mike Gip wants her to have a scan but there is nothing currently scheduled that I can find in Epic. Nor can I locate an order outstanding in the referrals queue. Please advise.

## 2014-12-08 ENCOUNTER — Other Ambulatory Visit: Payer: Self-pay | Admitting: Hematology and Oncology

## 2014-12-08 ENCOUNTER — Encounter: Payer: Self-pay | Admitting: Hematology and Oncology

## 2014-12-08 DIAGNOSIS — C187 Malignant neoplasm of sigmoid colon: Secondary | ICD-10-CM | POA: Insufficient documentation

## 2014-12-08 HISTORY — DX: Malignant neoplasm of sigmoid colon: C18.7

## 2014-12-08 NOTE — Telephone Encounter (Signed)
Please see phone note from Mickel Baas

## 2014-12-16 ENCOUNTER — Encounter: Payer: Self-pay | Admitting: Pain Medicine

## 2014-12-16 ENCOUNTER — Ambulatory Visit: Payer: Medicare Other | Attending: Pain Medicine | Admitting: Pain Medicine

## 2014-12-16 VITALS — BP 145/87 | HR 89 | Temp 97.9°F | Resp 16 | Ht 65.0 in | Wt 150.0 lb

## 2014-12-16 DIAGNOSIS — M5136 Other intervertebral disc degeneration, lumbar region: Secondary | ICD-10-CM | POA: Diagnosis not present

## 2014-12-16 DIAGNOSIS — M47816 Spondylosis without myelopathy or radiculopathy, lumbar region: Secondary | ICD-10-CM

## 2014-12-16 DIAGNOSIS — M79604 Pain in right leg: Secondary | ICD-10-CM | POA: Diagnosis present

## 2014-12-16 DIAGNOSIS — M1610 Unilateral primary osteoarthritis, unspecified hip: Secondary | ICD-10-CM | POA: Diagnosis not present

## 2014-12-16 DIAGNOSIS — M51369 Other intervertebral disc degeneration, lumbar region without mention of lumbar back pain or lower extremity pain: Secondary | ICD-10-CM | POA: Insufficient documentation

## 2014-12-16 DIAGNOSIS — M533 Sacrococcygeal disorders, not elsewhere classified: Secondary | ICD-10-CM

## 2014-12-16 DIAGNOSIS — M79605 Pain in left leg: Secondary | ICD-10-CM | POA: Diagnosis present

## 2014-12-16 DIAGNOSIS — M7060 Trochanteric bursitis, unspecified hip: Secondary | ICD-10-CM | POA: Insufficient documentation

## 2014-12-16 DIAGNOSIS — M545 Low back pain: Secondary | ICD-10-CM | POA: Diagnosis present

## 2014-12-16 HISTORY — DX: Other intervertebral disc degeneration, lumbar region: M51.36

## 2014-12-16 HISTORY — DX: Other intervertebral disc degeneration, lumbar region without mention of lumbar back pain or lower extremity pain: M51.369

## 2014-12-16 HISTORY — DX: Sacrococcygeal disorders, not elsewhere classified: M53.3

## 2014-12-16 HISTORY — DX: Spondylosis without myelopathy or radiculopathy, lumbar region: M47.816

## 2014-12-16 NOTE — Progress Notes (Signed)
Discharge home at 1043 hrs Patient ambulatory Information given on procedures Teach back 3 done Return  In one month

## 2014-12-16 NOTE — Progress Notes (Signed)
   Subjective:    Patient ID: Marilyn Romero, female    DOB: Mar 19, 1934, 81 y.o.   MRN: [id number]  HPI  Patient is 81 year old female returns to [facility] [facility] for further evaluation and treatment of pain involving the lumbar lower extremity region especially on the right. Patient is status post lumbar facet, medial branch nerve, blocks and is related over the relief of pain for which she has following the procedure. Is performing additional procedures including repeat of the lumbar facet,  medial branch nerve, blocks as well as of the procedures. Patient will inform us if she wishes to proceed with additional treatment. At the present time patient states she is doing remarkably well and we will avoid interventional treatment    Review of Systems     Objective:   Physical Exam  Tenderness over the splenius capitis and occipitalis muscles of mild degree was tenderness over the acromioclavicular and glenohumeral joint region of mild degree. With palpation over the thoracic facets reproducing minimal discomfort with unremarkable Spurling's maneuver. Have or equal grip strength present. Tinnitus over the thoracic facet thoracic paraspinal muscles with no crepitus of the thoracic region noted. L and Phalen's maneuver without increased pain of significant degree. Lumbar paraspinal muscles reproduced pain of moderate degree with moderate tenderness over the gluteal and piriformis muscles as outpatient of the right PSIS and PIIS regions reproduced mild discomfort to moderate discomfort. Raising tolerates approximately 30. There was tenderness along the iliotibial band on the right of mild to moderate degree. Mild to moderate tenderness of the greater trochanteric region was noted on the right especially. A dermatomal distribution detected. Abdomen nontender and no costovertebral angle tenderness noted.    Assessment & Plan:      Degenerative disc disease lumbar spine  Lumbosacral facet  syndrome  Sacroiliac joint dysfunction  Degenerative joint disease of the hip  Greater trochanteric bursitis with iliotibial band syndrome   Plan  Continue present medications. We will consider prescribing medications for treatment of patient in follow-up evaluation  F/U PCP for evaliation of  BP and general medical  condition.  F/U surgical evaluation.  F/U neurological evaluation.  May consider radiofrequency rhizolysis or intraspinal procedures pending response to present treatment and F/U evaluation.  [facility] to call [facility] should patient have concerns prior to scheduled return appointment.

## 2014-12-16 NOTE — Patient Instructions (Addendum)
Continue present medications.  As discussed we can consider performing lumbar facet, medial branch nerve blocks, again or consider performing lumbar epidural steroid injection or selective nerve root block for the residual pain that you have if you wish to do such  F/U PCP for evaliation of  BP and general medical  condition.  F/U surgical evaluation.  F/U neurological evaluation.  May consider radiofrequency rhizolysis or intraspinal procedures pending response to present treatment and F/U evaluation.  Patient to call [facility] should patient have concerns prior to scheduled return appointment. Facet Blocks Patient Information  Description: The facets are joints in the spine between the vertebrae.  Like any joints in the body, facets can become irritated and painful.  Arthritis can also effect the facets.  By injecting steroids and local anesthetic in and around these joints, we can temporarily block the nerve supply to them.  Steroids act directly on irritated nerves and tissues to reduce selling and inflammation which often leads to decreased pain.  Facet blocks may be done anywhere along the spine from the neck to the low back depending upon the location of your pain.   After numbing the skin with local anesthetic (like Novocaine), a small needle is passed onto the facet joints under x-ray guidance.  You may experience a sensation of pressure while this is being done.  The entire block usually lasts about 15-25 minutes.   Conditions which may be treated by facet blocks:   Low back/buttock pain  Neck/shoulder pain  Certain types of headaches  Preparation for the injection:  1. Do not eat any solid food or dairy products within 6 hours of your appointment. 2. You may drink clear liquid up to 2 hours before appointment.  Clear liquids include water, black coffee, juice or soda.  No milk or cream please. 3. You may take your regular medication, including pain medications,  with a sip of water before your appointment.  Diabetics should hold regular insulin (if taken separately) and take 1/2 normal NPH dose the morning of the procedure.  Carry some sugar containing items with you to your appointment. 4. A driver must accompany you and be prepared to drive you home after your procedure. 5. Bring all your current medications with you. 6. An IV may be inserted and sedation may be given at the discretion of the physician. 7. A blood pressure cuff, EKG and other monitors will often be applied during the procedure.  Some patients may need to have extra oxygen administered for a short period. 8. You will be asked to provide medical information, including your allergies and medications, prior to the procedure.  We must know immediately if you are taking blood thinners (like Coumadin/Warfarin) or if you are allergic to IV iodine contrast (dye).  We must know if you could possible be pregnant.  Possible side-effects:   Bleeding from needle site  Infection (rare, may require surgery)  Nerve injury (rare)  Numbness & tingling (temporary)  Difficulty urinating (rare, temporary)  Spinal headache (a headache worse with upright posture)  Light-headedness (temporary)  Pain at injection site (serveral days)  Decreased blood pressure (rare, temporary)  Weakness in arm/leg (temporary)  Pressure sensation in back/neck (temporary)   Call if you experience:   Fever/chills associated with headache or increased back/neck pain  Headache worsened by an upright position  New onset, weakness or numbness of an extremity below the injection site  Hives or difficulty breathing (go to the emergency room)  Inflammation or  drainage at the injection site(s)  Severe back/neck pain greater than usual  New symptoms which are concerning to you  Please note:  Although the local anesthetic injected can often make your back or neck feel good for several hours after the  injection, the pain will likely return. It takes 3-7 days for steroids to work.  You may not notice any pain relief for at least one week.  If effective, we will often do a series of 2-3 injections spaced 3-6 weeks apart to maximally decrease your pain.  After the initial series, you may be a candidate for a more permanent nerve block of the facets.  If you have any questions, please call #[phone]) [facility] An epidural steroid injection is given to relieve pain in your neck, back, or legs that is caused by the irritation or swelling of a nerve root. This procedure involves injecting a steroid and numbing medicine (anesthetic) into the epidural space. The epidural space is the space between the outer covering of your spinal cord and the bones that form your backbone (vertebra).  LET [facility] CARE PROVIDER KNOW ABOUT:  4. Any allergies you have. 5. All medicines you are taking, including vitamins, herbs, eye drops, creams, and over-the-counter medicines such as aspirin. 6. Previous problems you or members of your family have had with the use of anesthetics. 7. Any blood disorders or blood clotting disorders you have. 8. Previous surgeries you have had. 9. Medical conditions you have. RISKS AND COMPLICATIONS Generally, this is a safe procedure. However, as with any procedure, complications can occur. Possible complications of epidural steroid injection include:  Headache.  Bleeding.  Infection.  Allergic reaction to the medicines.  Damage to your nerves. The response to this procedure depends on the underlying cause of the pain and its duration. People who have long-term (chronic) pain are less likely to benefit from epidural steroids than are those people whose pain comes on strong and suddenly. BEFORE THE PROCEDURE  9. Ask your health care provider about changing or stopping your regular medicines. You may be advised to  stop taking blood-thinning medicines a few days before the procedure. 10. You may be given medicines to reduce anxiety. 11. Arrange for someone to take you home after the procedure. PROCEDURE   You will remain awake during the procedure. You may receive medicine to make you relaxed.  You will be asked to lie on your stomach.  The injection site will be cleaned.  The injection site will be numbed with a medicine (local anesthetic).  A needle will be injected through your skin into the epidural space.  Your health care provider will use an X-ray machine to ensure that the steroid is delivered closest to the affected nerve. You may have minimal discomfort at this time.  Once the needle is in the right position, the local anesthetic and the steroid will be injected into the epidural space.  The needle will then be removed and a bandage will be applied to the injection site. AFTER THE PROCEDURE  12. You may be monitored for a short time before you go home. 13. You may feel weakness or numbness in your arm or leg, which disappears within hours. 14. You may be allowed to eat, drink, and take your regular medicine. 15. You may have soreness at the site of the injection. Document Released: 10/23/2007 Document Revised: 03/18/2013 Document Reviewed: 01/02/2013 [facility] Patient Information 2015 [place], [place]. This information is  not intended to replace advice given to you by your health care provider. Make sure you discuss any questions you have with your health care provider. GENERAL RISKS AND COMPLICATIONS  What are the risk, side effects and possible complications? Generally speaking, most procedures are safe.  However, with any procedure there are risks, side effects, and the possibility of complications.  The risks and complications are dependent upon the sites that are lesioned, or the type of nerve block to be performed.  The closer the procedure is to the spine, the more serious the risks  are.  Great care is taken when placing the radio frequency needles, block needles or lesioning probes, but sometimes complications can occur. 1. Infection: Any time there is an injection through the skin, there is a risk of infection.  This is why sterile conditions are used for these blocks.  There are four possible types of infection. 1. Localized skin infection. 2. Central Nervous System Infection-This can be in the form of Meningitis, which can be deadly. 3. Epidural Infections-This can be in the form of an epidural abscess, which can cause pressure inside of the spine, causing compression of the spinal cord with subsequent paralysis. This would require an emergency surgery to decompress, and there are no guarantees that the patient would recover from the paralysis. 4. Discitis-This is an infection of the intervertebral discs.  It occurs in about 1% of discography procedures.  It is difficult to treat and it may lead to surgery.        2. Pain: the needles have to go through skin and soft tissues, will cause soreness.       3. Damage to internal structures:  The nerves to be lesioned may be near blood vessels or    other nerves which can be potentially damaged.       4. Bleeding: Bleeding is more common if the patient is taking blood thinners such as  aspirin, Coumadin, Ticiid, Plavix, etc., or if he/she have some genetic predisposition  such as hemophilia. Bleeding into the spinal canal can cause compression of the spinal  cord with subsequent paralysis.  This would require an emergency surgery to  decompress and there are no guarantees that the patient would recover from the  paralysis.       5. Pneumothorax:  Puncturing of a lung is a possibility, every time a needle is introduced in  the area of the chest or upper back.  Pneumothorax refers to free air around the  collapsed lung(s), inside of the thoracic cavity (chest cavity).  Another two possible  complications related to a similar event would  include: Hemothorax and Chylothorax.   These are variations of the Pneumothorax, where instead of air around the collapsed  lung(s), you may have blood or chyle, respectively.       6. Spinal headaches: They may occur with any procedures in the area of the spine.       7. Persistent CSF (Cerebro-Spinal Fluid) leakage: This is a rare problem, but may occur  with prolonged intrathecal or epidural catheters either due to the formation of a fistulous  track or a dural tear.       8. Nerve damage: By working so close to the spinal cord, there is always a possibility of  nerve damage, which could be as serious as a permanent spinal cord injury with  paralysis.       9. Death:  Although rare, severe deadly allergic reactions known as "Anaphylactic  reaction" can occur to any of the medications used.      10. Worsening of the symptoms:  We can always make thing worse.  What are the chances of something like this happening? Chances of any of this occuring are extremely low.  By statistics, you have more of a chance of getting killed in a motor vehicle accident: while driving to the hospital than any of the above occurring .  Nevertheless, you should be aware that they are possibilities.  In general, it is similar to taking a shower.  Everybody knows that you can slip, hit your head and get killed.  Does that mean that you should not shower again?  Nevertheless always keep in mind that statistics do not mean anything if you happen to be on the wrong side of them.  Even if a procedure has a 1 (one) in a 1,000,000 (million) chance of going wrong, it you happen to be that one..Also, keep in mind that by statistics, you have more of a chance of having something go wrong when taking medications.  Who should not have this procedure? If you are on a blood thinning medication (e.g. Coumadin, Plavix, see list of "Blood Thinners"), or if you have an active infection going on, you should not have the procedure.  If you are  taking any blood thinners, please inform your physician.  How should I prepare for this procedure?  Do not eat or drink anything at least six hours prior to the procedure.  Bring a driver with you .  It cannot be a taxi.  Come accompanied by an adult that can drive you back, and that is strong enough to help you if your legs get weak or numb from the local anesthetic.  Take all of your medicines the morning of the procedure with just enough water to swallow them.  If you have diabetes, make sure that you are scheduled to have your procedure done first thing in the morning, whenever possible.  If you have diabetes, take only half of your insulin dose and notify our nurse that you have done so as soon as you arrive at the clinic.  If you are diabetic, but only take blood sugar pills (oral hypoglycemic), then do not take them on the morning of your procedure.  You may take them after you have had the procedure.  Do not take aspirin or any aspirin-containing medications, at least eleven (11) days prior to the procedure.  They may prolong bleeding.  Wear loose fitting clothing that may be easy to take off and that you would not mind if it got stained with Betadine or blood.  Do not wear any jewelry or perfume  Remove any nail coloring.  It will interfere with some of our monitoring equipment.  NOTE: Remember that this is not meant to be interpreted as a complete list of all possible complications.  Unforeseen problems may occur.  BLOOD THINNERS The following drugs contain aspirin or other products, which can cause increased bleeding during surgery and should not be taken for 2 weeks prior to and 1 week after surgery.  If you should need take something for relief of minor pain, you may take acetaminophen which is found in Tylenol,m Datril, Anacin-3 and Panadol. It is not blood thinner. The products listed below are.  Do not take any of the products listed below in addition to any listed on  your instruction sheet.  A.P.C or A.P.C with Codeine Codeine Phosphate Capsules #3  Ibuprofen Ridaura  ABC compound Congesprin Imuran rimadil  Advil Cope Indocin Robaxisal  Alka-Seltzer Effervescent Pain Reliever and Antacid Coricidin or Coricidin-D  Indomethacin Rufen  Alka-Seltzer plus Cold Medicine Cosprin Ketoprofen S-A-C Tablets  Anacin Analgesic Tablets or Capsules Coumadin Korlgesic Salflex  Anacin Extra Strength Analgesic tablets or capsules CP-2 Tablets Lanoril Salicylate  Anaprox Cuprimine Capsules Levenox Salocol  Anexsia-D Dalteparin Magan Salsalate  Anodynos Darvon compound Magnesium Salicylate Sine-off  Ansaid Dasin Capsules Magsal Sodium Salicylate  Anturane Depen Capsules Marnal Soma  APF Arthritis pain formula Dewitt's Pills Measurin Stanback  Argesic Dia-Gesic Meclofenamic Sulfinpyrazone  Arthritis Bayer Timed Release Aspirin Diclofenac Meclomen Sulindac  Arthritis pain formula Anacin Dicumarol Medipren Supac  Analgesic (Safety coated) Arthralgen Diffunasal Mefanamic Suprofen  Arthritis Strength Bufferin Dihydrocodeine Mepro Compound Suprol  Arthropan liquid Dopirydamole Methcarbomol with Aspirin Synalgos  ASA tablets/Enseals Disalcid Micrainin Tagament  Ascriptin Doan's Midol Talwin  Ascriptin A/D Dolene Mobidin Tanderil  Ascriptin Extra Strength Dolobid Moblgesic Ticlid  Ascriptin with Codeine Doloprin or Doloprin with Codeine Momentum Tolectin  Asperbuf Duoprin Mono-gesic Trendar  Aspergum Duradyne Motrin or Motrin IB Triminicin  Aspirin plain, buffered or enteric coated Durasal Myochrisine Trigesic  Aspirin Suppositories Easprin Nalfon Trillsate  Aspirin with Codeine Ecotrin Regular or Extra Strength Naprosyn Uracel  Atromid-S Efficin Naproxen Ursinus  Auranofin Capsules Elmiron Neocylate Vanquish  Axotal Emagrin Norgesic Verin  Azathioprine Empirin or Empirin with Codeine Normiflo Vitamin E  Azolid Emprazil Nuprin Voltaren  Bayer Aspirin plain, buffered or  children's or timed BC Tablets or powders Encaprin Orgaran Warfarin Sodium  Buff-a-Comp Enoxaparin Orudis Zorpin  Buff-a-Comp with Codeine Equegesic Os-Cal-Gesic   Buffaprin Excedrin plain, buffered or Extra Strength Oxalid   Bufferin Arthritis Strength Feldene Oxphenbutazone   Bufferin plain or Extra Strength Feldene Capsules Oxycodone with Aspirin   Bufferin with Codeine Fenoprofen Fenoprofen Pabalate or Pabalate-SF   Buffets II Flogesic Panagesic   Buffinol plain or Extra Strength Florinal or Florinal with Codeine Panwarfarin   Buf-Tabs Flurbiprofen Penicillamine   Butalbital Compound Four-way cold tablets Penicillin   Butazolidin Fragmin Pepto-Bismol   Carbenicillin Geminisyn Percodan   Carna Arthritis Reliever Geopen Persantine   Carprofen Gold's salt Persistin   Chloramphenicol Goody's Phenylbutazone   Chloromycetin Haltrain Piroxlcam   Clmetidine heparin Plaquenil   Cllnoril Hyco-pap Ponstel   Clofibrate Hydroxy chloroquine Propoxyphen         Before stopping any of these medications, be sure to consult the physician who ordered them.  Some, such as Coumadin (Warfarin) are ordered to prevent or treat serious conditions such as "deep thrombosis", "pumonary embolisms", and other heart problems.  The amount of time that you may need off of the medication may also vary with the medication and the reason for which you were taking it.  If you are taking any of these medications, please make sure you notify your pain physician before you undergo any procedures.         Selective Nerve Root Block Patient Information  Description: Specific nerve roots exit the spinal canal and these nerves can be compressed and inflamed by a bulging disc and bone spurs.  By injecting steroids on the nerve root, we can potentially decrease the inflammation surrounding these nerves, which often leads to decreased pain.  Also, by injecting local anesthesia on the nerve root, this can provide [place] helpful  information to give to your referring doctor if it decreases your pain.  Selective nerve root blocks can be done along the spine from the neck to the low  back depending on the location of your pain.   After numbing the skin with local anesthesia, a small needle is passed to the nerve root and the position of the needle is verified using x-ray pictures.  After the needle is in correct position, we then deposit the medication.  You may experience a pressure sensation while this is being done.  The entire block usually lasts less than 15 minutes.  Conditions that may be treated with selective nerve root blocks:  Low back and leg pain  Spinal stenosis  Diagnostic block prior to potential surgery  Neck and arm pain  Post laminectomy syndrome  Preparation for the injection:   Do not eat any solid food or dairy products within 6 hours of your appointment.  You may drink clear liquids up to 2 hours before an appointment.  Clear liquids include water, black coffee, juice or soda.  No milk or cream please.  You may take your regular medications, including pain medications, with a sip of water before your appointment.  Diabetics should hold regular insulin (if taken separately) and take 1/2 normal NPH dose the morning of the procedure.  Carry some sugar containing items with you to your appointment.  A driver must accompany you and be prepared to drive you home after your procedure.  Bring all your current medications with you.  An IV may be inserted and sedation may be given at the discretion of the physician.  A blood pressure cuff, EKG, and other monitors will often be applied during the procedure.  Some patients may need to have extra oxygen administered for a short period.  You will be asked to provide medical information, including allergies, prior to the procedure.  We must know immediately if you are taking blood  Thinners (like Coumadin) or if you are allergic to IV iodine contrast  (dye).  Possible side-effects: All are usually temporary  Bleeding from needle site  Light headedness  Numbness and tingling  Decreased blood pressure  Weakness in arms/legs  Pressure sensation in back/neck  Pain at injection site (several days)  Possible complications: All are extremely rare  Infection  Nerve injury  Spinal headache (a headache wore with upright position)  Call if you experience:  Fever/chills associated with headache or increased back/neck pain  Headache worsened by an upright position  New onset weakness or numbness of an extremity below the injection site  Hives or difficulty breathing (go to the emergency room)  Inflammation or drainage at the injection site(s)  Severe back/neck pain greater than usual  New symptoms which are concerning to you  Please note:  Although the local anesthetic injected can often make your back or neck feel good for several hours after the injection the pain will likely return.  It takes 3-5 days for steroids to work on the nerve root. You may not notice any pain relief for at least one week.  If effective, we will often do a series of 3 injections spaced 3-6 weeks apart to maximally decrease your pain.    If you have any questions, please call [phone] [facility]

## 2014-12-16 NOTE — Progress Notes (Signed)
   Subjective:    Patient ID: Marilyn Romero, female    DOB: 1934-01-12, 81 y.o.   MRN: [id number]  HPI    Review of Systems     Objective:   Physical Exam        Assessment & Plan:

## 2015-01-18 ENCOUNTER — Encounter: Payer: Self-pay | Admitting: Pain Medicine

## 2015-01-18 ENCOUNTER — Ambulatory Visit: Payer: Medicare Other | Attending: Pain Medicine | Admitting: Pain Medicine

## 2015-01-18 VITALS — BP 136/72 | HR 86 | Temp 98.6°F | Resp 16 | Ht 65.0 in | Wt 152.0 lb

## 2015-01-18 DIAGNOSIS — M5136 Other intervertebral disc degeneration, lumbar region: Secondary | ICD-10-CM

## 2015-01-18 DIAGNOSIS — M47816 Spondylosis without myelopathy or radiculopathy, lumbar region: Secondary | ICD-10-CM

## 2015-01-18 DIAGNOSIS — M533 Sacrococcygeal disorders, not elsewhere classified: Secondary | ICD-10-CM

## 2015-01-18 NOTE — Progress Notes (Signed)
Safety precautions to be maintained throughout the outpatient stay will include: orient to surroundings, keep bed in low position, maintain call bell within reach at all times, provide assistance with transfer out of bed and ambulation.  1053 Patient discharged to home ambulatory. Pre procedure instructions given.   Bethann Humble RN

## 2015-01-18 NOTE — Progress Notes (Signed)
   Subjective:    Patient ID: Marilyn Romero, female    DOB: 06/22/1934, 81 y.o.   MRN: [id number]  HPI  Patient is 80 year old female returns to [place] for further evaluation and treatment of pain involving the lower back and lower extremity region. The patient is with prior history of surgical intervention of the right lower extremity with prior total hip replacement on the right. Patient states that her pain of the lower back region improved significantly following treatment performed in [facility] at the present time her pain involves the region of the hip and buttocks and inguinal region. Patient denies trauma change in events of daily living the call significant change in symptomatology. We will proceed with sacroiliac joint injections at time return appointment in attempt to decrease severity of patient's symptoms and hopefully avoid progression of patient's symptoms the patient was understanding and in agreement status treatment plan.       Review of Systems     Objective:   Physical Exam  There was mild tenderness of the splenius capitis and occipitalis musculature region. Palpation of these regions were without increased pain of significant degree. Palpation over the cervical facet cervical paraspinal musculature region and thoracic facet thoracic paraspinal musculature region was a tends to palpation of mild degree patient was with unremarkable Spurling's maneuver. Tinel and Phalen's maneuver were without increase of pain of significant degree. There was tenderness to palpation over the region of the acromioclavicular glenohumeral joint region a minimal degree. Patient appeared to be with bilaterally equal grip strength and Tinel and Phalen's maneuver without increase of pain of significant degree. Palpation of the gluteal and piriformis musculature region reproduced moderate discomfort especially on the right with tenderness over the PSIS and PII S region on  the right greater than left. Straight leg raising tolerates approximately 20 no increased pain with dorsiflexion noted. There was moderate to moderately severe increase of pain with palpation of the greater trochanteric region iliotibial band region palpation of the PSIS and PII S regions especially on the right. Negative clonus negative Homans abdomen nontender with no costovertebral tenderness noted.        Assessment & Plan:  Degenerative disc disease lumbar spine Lumbar facet syndrome  Degenerative joint disease hips Status post total hip replacement  Sacroiliac joint dysfunction  Operator spasm     Plan  Continue present medications.  Sacroiliac joint injection to be performed at time return appointment  F/U PCP for evaliation of  BP and general medical  condition.  F/U surgical evaluation.  F/U neurological evaluation.  May consider radiofrequency rhizolysis or intraspinal procedures pending response to present treatment and F/U evaluation.  Patient to call [facility] should patient have concerns prior to scheduled return appointment.

## 2015-01-18 NOTE — Patient Instructions (Addendum)
Continue present medications.  Sacroiliac joint injection Wednesday, 01/26/2015  F/U PCP for evaliation of  BP and general medical  condition.  F/U surgical evaluation.  F/U neurological evaluation.  May consider radiofrequency rhizolysis or intraspinal procedures pending response to present treatment and F/U evaluation.  Patient to call [facility] should patient have concerns prior to scheduled return appointment.

## 2015-01-25 ENCOUNTER — Other Ambulatory Visit: Payer: Self-pay | Admitting: Pain Medicine

## 2015-01-26 ENCOUNTER — Ambulatory Visit: Payer: Medicare Other | Attending: Pain Medicine | Admitting: Pain Medicine

## 2015-01-26 ENCOUNTER — Encounter: Payer: Self-pay | Admitting: Pain Medicine

## 2015-01-26 VITALS — BP 129/66 | HR 72 | Temp 96.1°F | Resp 16 | Ht 65.0 in | Wt 150.0 lb

## 2015-01-26 DIAGNOSIS — M545 Low back pain: Secondary | ICD-10-CM | POA: Diagnosis present

## 2015-01-26 DIAGNOSIS — M79605 Pain in left leg: Secondary | ICD-10-CM | POA: Insufficient documentation

## 2015-01-26 DIAGNOSIS — M47816 Spondylosis without myelopathy or radiculopathy, lumbar region: Secondary | ICD-10-CM

## 2015-01-26 DIAGNOSIS — Z96649 Presence of unspecified artificial hip joint: Secondary | ICD-10-CM | POA: Diagnosis not present

## 2015-01-26 DIAGNOSIS — M79604 Pain in right leg: Secondary | ICD-10-CM | POA: Insufficient documentation

## 2015-01-26 DIAGNOSIS — M5136 Other intervertebral disc degeneration, lumbar region: Secondary | ICD-10-CM

## 2015-01-26 DIAGNOSIS — M533 Sacrococcygeal disorders, not elsewhere classified: Secondary | ICD-10-CM

## 2015-01-26 MED ORDER — TRIAMCINOLONE ACETONIDE 40 MG/ML IJ SUSP
INTRAMUSCULAR | Status: AC
  Administered 2015-01-26: 12:00:00
  Filled 2015-01-26: qty 1

## 2015-01-26 MED ORDER — CEFAZOLIN SODIUM 1 G IJ SOLR
INTRAMUSCULAR | Status: AC
  Filled 2015-01-26: qty 10

## 2015-01-26 MED ORDER — CEFUROXIME AXETIL 250 MG PO TABS: 250.0000 mg | ORAL_TABLET | Freq: Two times a day (BID) | ORAL | Status: DC

## 2015-01-26 MED ORDER — FENTANYL CITRATE (PF) 100 MCG/2ML IJ SOLN
INTRAMUSCULAR | Status: AC
  Administered 2015-01-26: 100 ug via INTRAVENOUS
  Filled 2015-01-26: qty 2

## 2015-01-26 MED ORDER — ORPHENADRINE CITRATE 30 MG/ML IJ SOLN
INTRAMUSCULAR | Status: AC
  Administered 2015-01-26: 12:00:00
  Filled 2015-01-26: qty 2

## 2015-01-26 MED ORDER — MIDAZOLAM HCL 5 MG/5ML IJ SOLN
INTRAMUSCULAR | Status: AC
  Administered 2015-01-26: 2 mg via INTRAVENOUS
  Filled 2015-01-26: qty 5

## 2015-01-26 MED ORDER — BUPIVACAINE HCL (PF) 0.25 % IJ SOLN
INTRAMUSCULAR | Status: AC
  Administered 2015-01-26: 12:00:00
  Filled 2015-01-26: qty 30

## 2015-01-26 NOTE — Progress Notes (Signed)
   Subjective:    Patient ID: Marilyn Romero, female    DOB: 11/14/1933, 81 y.o.   MRN: [id number]  HPI  NOTE:  The patient is a 81 y.o. female who returns [facility] the [facility] for further evaluation and treatment of pain involving the lumbar and lower extremity region with pain involving the region of the hip and buttocks as well.  Prior studies reveal patient to be with evidence of degenerative joint disease of the hip with prior total hip replacement with there being concern regarding significant bone of patient's pain began with component of sacroiliac joint dysfunction as well area patient is a severe increased pain with palpation over the PSIS and PIIS regions .  There is concern regarding patient's pain being due to significant component of sacroiliac joint dysfunction. We have discussed patient's condition.  The risks, benefits, and expectations of the procedure have been discussed and explained to the patient who is understanding and wishes to proceed with interventional treatment as planned.    PROCEDURE: Sacroiliac joint on the right side injection with IV Versed, IV Fentanyl conscious sedation, EKG, blood pressure, pulse, pulse oximetry monitoring.  Procedure was performed with patient in prone position under fluoroscopic guidance.    Inferior pole sacroiliac joint injection on the right side.  With patient in prone position and Betadine prep of proposed entry site of accomplished, 22 -gauge needle was inserted at the inferior pole of the sacroiliac joint on the left under fluoroscopic guidance.  Following documentation of needle placement at the inferior pole of the sacroiliac joint on the left, a total of 1cc 0.25% bupivacaine with kenalog was injected for right inferior pole sacroiliac joint injection.  Needle was removed.    Superior pole sacroiliac joint injection on the right side.  With patient in prone position and Betadine prep of proposed entry site of accomplished, 22-gauge  needle was inserted at the superior pole of the sacroiliac joint on the left under fluoroscopic guidance.  Following documentation of needle placement at the inferior pole of the sacroiliac joint on the left, a total of 1cc 0.25% bupivacaine with kenalog was injected for right superior pole sacroiliac joint injection.  Needle was removed.    Patient tolerated procedure well.  A total of 10 mg Kenalog was utilized for the entire procedure.  PLAN:    1. Medications: Will continue presently prescribed medications. 2. Patient to follow up with primary care physician Dr. Gayland Curry  for evaluation of blood pressure and general medical condition status post sacroiliac joint injection performed on today's visit. 3. Surgical evaluation is discussed. 4. Neurological evaluation with PNCV/EMG studies as discussed. 5. Patient may be candidate for radiofrequency procedures, Botox injections, implantation-type procedures, and other treatment pending response to treatment and follow-up evaluation. 6. Patient has been advised to adhere to proper body mechanics and to call [facility] prior to scheduled return appointment should there be significant change in condition or patient have other concerns regarding condition prior to scheduled return appointment.    Patient with understanding and in agreement with suggested treatment plan.    Review of Systems     Objective:   Physical Exam        Assessment & Plan:

## 2015-01-26 NOTE — Patient Instructions (Addendum)
Continue present medications and antibiotics. Please obtain your antibiotic, Ceftin, today and begin taking antibiotic today  F/U PCP for evaliation of  BP and general medical  condition.  F/U surgical evaluation as discussed  F/U neurological evaluation.  May consider radiofrequency rhizolysis or intraspinal procedures pending response to present treatment and F/U evaluation.  Patient to call [facility] should patient have concerns prior to scheduled return appointment.  Pain Management Discharge Instructions  General Discharge Instructions :  If you need to reach your doctor call: Monday-Friday 8:00 am - 4:00 pm at [phone] or toll free [phone].  After clinic hours [phone] to have operator reach doctor.  Bring all of your medication bottles to all your appointments in the pain clinic.  To cancel or reschedule your appointment with Pain Management please remember to call 24 hours in advance to avoid a fee.  Refer to the educational materials which you have been given on: General Risks, I had my Procedure. Discharge Instructions, Post Sedation.  Post Procedure Instructions:  The drugs you were given will stay in your system until tomorrow, so for the next 24 hours you should not drive, make any legal decisions or drink any alcoholic beverages.  You may eat anything you prefer, but it is better to start with liquids then soups and crackers, and gradually work up to solid foods.  Please notify your doctor immediately if you have any unusual bleeding, trouble breathing or pain that is not related to your normal pain.  Depending on the type of procedure that was done, some parts of your body may feel week and/or numb.  This usually clears up by tonight or the next day.  Walk with the use of an assistive device or accompanied by an adult for the 24 hours.  You may use ice on the affected area for the first 24 hours.  Put ice in a Ziploc bag and cover with a  towel and place against area 15 minutes on 15 minutes off.  You may switch to heat after 24 hours.GENERAL RISKS AND COMPLICATIONS  What are the risk, side effects and possible complications? Generally speaking, most procedures are safe.  However, with any procedure there are risks, side effects, and the possibility of complications.  The risks and complications are dependent upon the sites that are lesioned, or the type of nerve block to be performed.  The closer the procedure is to the spine, the more serious the risks are.  Great care is taken when placing the radio frequency needles, block needles or lesioning probes, but sometimes complications can occur. 1. Infection: Any time there is an injection through the skin, there is a risk of infection.  This is why sterile conditions are used for these blocks.  There are four possible types of infection. 1. Localized skin infection. 2. Central Nervous System Infection-This can be in the form of Meningitis, which can be deadly. 3. Epidural Infections-This can be in the form of an epidural abscess, which can cause pressure inside of the spine, causing compression of the spinal cord with subsequent paralysis. This would require an emergency surgery to decompress, and there are no guarantees that the patient would recover from the paralysis. 4. Discitis-This is an infection of the intervertebral discs.  It occurs in about 1% of discography procedures.  It is difficult to treat and it may lead to surgery.        2. Pain: the needles have to go through skin and soft tissues, will  cause soreness.       3. Damage to internal structures:  The nerves to be lesioned may be near blood vessels or    other nerves which can be potentially damaged.       4. Bleeding: Bleeding is more common if the patient is taking blood thinners such as  aspirin, Coumadin, Ticiid, Plavix, etc., or if he/she have some genetic predisposition  such as hemophilia. Bleeding into the spinal  canal can cause compression of the spinal  cord with subsequent paralysis.  This would require an emergency surgery to  decompress and there are no guarantees that the patient would recover from the  paralysis.       5. Pneumothorax:  Puncturing of a lung is a possibility, every time a needle is introduced in  the area of the chest or upper back.  Pneumothorax refers to free air around the  collapsed lung(s), inside of the thoracic cavity (chest cavity).  Another two possible  complications related to a similar event would include: Hemothorax and Chylothorax.   These are variations of the Pneumothorax, where instead of air around the collapsed  lung(s), you may have blood or chyle, respectively.       6. Spinal headaches: They may occur with any procedures in the area of the spine.       7. Persistent CSF (Cerebro-Spinal Fluid) leakage: This is a rare problem, but may occur  with prolonged intrathecal or epidural catheters either due to the formation of a fistulous  track or a dural tear.       8. Nerve damage: By working so close to the spinal cord, there is always a possibility of  nerve damage, which could be as serious as a permanent spinal cord injury with  paralysis.       9. Death:  Although rare, severe deadly allergic reactions known as "Anaphylactic  reaction" can occur to any of the medications used.      10. Worsening of the symptoms:  We can always make thing worse.  What are the chances of something like this happening? Chances of any of this occuring are extremely low.  By statistics, you have more of a chance of getting killed in a motor vehicle accident: while driving to the hospital than any of the above occurring .  Nevertheless, you should be aware that they are possibilities.  In general, it is similar to taking a shower.  Everybody knows that you can slip, hit your head and get killed.  Does that mean that you should not shower again?  Nevertheless always keep in mind that statistics  do not mean anything if you happen to be on the wrong side of them.  Even if a procedure has a 1 (one) in a 1,000,000 (million) chance of going wrong, it you happen to be that one..Also, keep in mind that by statistics, you have more of a chance of having something go wrong when taking medications.  Who should not have this procedure? If you are on a blood thinning medication (e.g. Coumadin, Plavix, see list of "Blood Thinners"), or if you have an active infection going on, you should not have the procedure.  If you are taking any blood thinners, please inform your physician.  How should I prepare for this procedure?  Do not eat or drink anything at least six hours prior to the procedure.  Bring a driver with you .  It cannot be a taxi.  Come accompanied by  an adult that can drive you back, and that is strong enough to help you if your legs get weak or numb from the local anesthetic.  Take all of your medicines the morning of the procedure with just enough water to swallow them.  If you have diabetes, make sure that you are scheduled to have your procedure done first thing in the morning, whenever possible.  If you have diabetes, take only half of your insulin dose and notify our nurse that you have done so as soon as you arrive at the clinic.  If you are diabetic, but only take blood sugar pills (oral hypoglycemic), then do not take them on the morning of your procedure.  You may take them after you have had the procedure.  Do not take aspirin or any aspirin-containing medications, at least eleven (11) days prior to the procedure.  They may prolong bleeding.  Wear loose fitting clothing that may be easy to take off and that you would not mind if it got stained with Betadine or blood.  Do not wear any jewelry or perfume  Remove any nail coloring.  It will interfere with some of our monitoring equipment.  NOTE: Remember that this is not meant to be interpreted as a complete list of all  possible complications.  Unforeseen problems may occur.  BLOOD THINNERS The following drugs contain aspirin or other products, which can cause increased bleeding during surgery and should not be taken for 2 weeks prior to and 1 week after surgery.  If you should need take something for relief of minor pain, you may take acetaminophen which is found in Tylenol,m Datril, Anacin-3 and Panadol. It is not blood thinner. The products listed below are.  Do not take any of the products listed below in addition to any listed on your instruction sheet.  A.P.C or A.P.C with Codeine Codeine Phosphate Capsules #3 Ibuprofen Ridaura  ABC compound Congesprin Imuran rimadil  Advil Cope Indocin Robaxisal  Alka-Seltzer Effervescent Pain Reliever and Antacid Coricidin or Coricidin-D  Indomethacin Rufen  Alka-Seltzer plus Cold Medicine Cosprin Ketoprofen S-A-C Tablets  Anacin Analgesic Tablets or Capsules Coumadin Korlgesic Salflex  Anacin Extra Strength Analgesic tablets or capsules CP-2 Tablets Lanoril Salicylate  Anaprox Cuprimine Capsules Levenox Salocol  Anexsia-D Dalteparin Magan Salsalate  Anodynos Darvon compound Magnesium Salicylate Sine-off  Ansaid Dasin Capsules Magsal Sodium Salicylate  Anturane Depen Capsules Marnal Soma  APF Arthritis pain formula Dewitt's Pills Measurin Stanback  Argesic Dia-Gesic Meclofenamic Sulfinpyrazone  Arthritis Bayer Timed Release Aspirin Diclofenac Meclomen Sulindac  Arthritis pain formula Anacin Dicumarol Medipren Supac  Analgesic (Safety coated) Arthralgen Diffunasal Mefanamic Suprofen  Arthritis Strength Bufferin Dihydrocodeine Mepro Compound Suprol  Arthropan liquid Dopirydamole Methcarbomol with Aspirin Synalgos  ASA tablets/Enseals Disalcid Micrainin Tagament  Ascriptin Doan's Midol Talwin  Ascriptin A/D Dolene Mobidin Tanderil  Ascriptin Extra Strength Dolobid Moblgesic Ticlid  Ascriptin with Codeine Doloprin or Doloprin with Codeine Momentum Tolectin   Asperbuf Duoprin Mono-gesic Trendar  Aspergum Duradyne Motrin or Motrin IB Triminicin  Aspirin plain, buffered or enteric coated Durasal Myochrisine Trigesic  Aspirin Suppositories Easprin Nalfon Trillsate  Aspirin with Codeine Ecotrin Regular or Extra Strength Naprosyn Uracel  Atromid-S Efficin Naproxen Ursinus  Auranofin Capsules Elmiron Neocylate Vanquish  Axotal Emagrin Norgesic Verin  Azathioprine Empirin or Empirin with Codeine Normiflo Vitamin E  Azolid Emprazil Nuprin Voltaren  Bayer Aspirin plain, buffered or children's or timed BC Tablets or powders Encaprin Orgaran Warfarin Sodium  Buff-a-Comp Enoxaparin Orudis Zorpin  Buff-a-Comp with Codeine  Equegesic Os-Cal-Gesic   Buffaprin Excedrin plain, buffered or Extra Strength Oxalid   Bufferin Arthritis Strength Feldene Oxphenbutazone   Bufferin plain or Extra Strength Feldene Capsules Oxycodone with Aspirin   Bufferin with Codeine Fenoprofen Fenoprofen Pabalate or Pabalate-SF   Buffets II Flogesic Panagesic   Buffinol plain or Extra Strength Florinal or Florinal with Codeine Panwarfarin   Buf-Tabs Flurbiprofen Penicillamine   Butalbital Compound Four-way cold tablets Penicillin   Butazolidin Fragmin Pepto-Bismol   Carbenicillin Geminisyn Percodan   Carna Arthritis Reliever Geopen Persantine   Carprofen Gold's salt Persistin   Chloramphenicol Goody's Phenylbutazone   Chloromycetin Haltrain Piroxlcam   Clmetidine heparin Plaquenil   Cllnoril Hyco-pap Ponstel   Clofibrate Hydroxy chloroquine Propoxyphen         Before stopping any of these medications, be sure to consult the physician who ordered them.  Some, such as Coumadin (Warfarin) are ordered to prevent or treat serious conditions such as "deep thrombosis", "pumonary embolisms", and other heart problems.  The amount of time that you may need off of the medication may also vary with the medication and the reason for which you were taking it.  If you are taking any of these  medications, please make sure you notify your pain physician before you undergo any procedures.  Pick up prescription at pharmacy

## 2015-01-26 NOTE — Progress Notes (Signed)
Safety precautions to be maintained throughout the outpatient stay will include: orient to surroundings, keep bed in low position, maintain call bell within reach at all times, provide assistance with transfer out of bed and ambulation.  

## 2015-01-27 ENCOUNTER — Telehealth: Payer: Self-pay | Admitting: *Deleted

## 2015-01-27 NOTE — Telephone Encounter (Signed)
Patient denies complications post procedure. 

## 2015-01-28 ENCOUNTER — Other Ambulatory Visit: Payer: Self-pay | Admitting: Family Medicine

## 2015-01-28 DIAGNOSIS — R222 Localized swelling, mass and lump, trunk: Secondary | ICD-10-CM

## 2015-02-08 ENCOUNTER — Ambulatory Visit
Admission: RE | Admit: 2015-02-08 | Discharge: 2015-02-08 | Disposition: A | Discharge status: Home / Self Care | Payer: Medicare Other | Source: Ambulatory Visit | Attending: Family Medicine | Admitting: Family Medicine

## 2015-02-08 DIAGNOSIS — R222 Localized swelling, mass and lump, trunk: Secondary | ICD-10-CM | POA: Insufficient documentation

## 2015-02-22 ENCOUNTER — Ambulatory Visit: Payer: Medicare Other | Admitting: Pain Medicine

## 2015-05-10 ENCOUNTER — Other Ambulatory Visit: Payer: Self-pay

## 2015-05-10 DIAGNOSIS — C187 Malignant neoplasm of sigmoid colon: Secondary | ICD-10-CM

## 2015-05-11 ENCOUNTER — Other Ambulatory Visit: Payer: Medicare Other

## 2015-05-11 ENCOUNTER — Ambulatory Visit: Payer: Medicare Other | Admitting: Hematology and Oncology

## 2015-05-18 ENCOUNTER — Inpatient Hospital Stay: Payer: Medicare Other | Admitting: Hematology and Oncology

## 2015-05-18 ENCOUNTER — Inpatient Hospital Stay: Payer: Medicare Other | Attending: Family Medicine

## 2015-05-23 ENCOUNTER — Ambulatory Visit
Admission: RE | Admit: 2015-05-23 | Discharge: 2015-05-23 | Disposition: A | Discharge status: Home / Self Care | Payer: Medicare Other | Source: Ambulatory Visit | Attending: Family Medicine | Admitting: Family Medicine

## 2015-05-23 ENCOUNTER — Other Ambulatory Visit: Payer: Self-pay | Admitting: Family Medicine

## 2015-05-23 DIAGNOSIS — K5730 Diverticulosis of large intestine without perforation or abscess without bleeding: Secondary | ICD-10-CM | POA: Diagnosis not present

## 2015-05-23 DIAGNOSIS — R1084 Generalized abdominal pain: Secondary | ICD-10-CM

## 2015-05-23 DIAGNOSIS — R109 Unspecified abdominal pain: Secondary | ICD-10-CM | POA: Insufficient documentation

## 2015-05-23 MED ORDER — IOHEXOL 300 MG/ML  SOLN
100.0000 mL | Freq: Once | INTRAMUSCULAR | Status: AC | PRN
  Administered 2015-05-23: 100 mL via INTRAVENOUS

## 2015-06-08 ENCOUNTER — Inpatient Hospital Stay (HOSPITAL_BASED_OUTPATIENT_CLINIC_OR_DEPARTMENT_OTHER): Payer: Medicare Other | Admitting: Hematology and Oncology

## 2015-06-08 ENCOUNTER — Inpatient Hospital Stay: Payer: Medicare Other | Attending: Hematology and Oncology

## 2015-06-08 VITALS — BP 167/88 | HR 73 | Temp 96.5°F | Resp 18 | Ht 65.0 in | Wt 159.2 lb

## 2015-06-08 DIAGNOSIS — C187 Malignant neoplasm of sigmoid colon: Secondary | ICD-10-CM

## 2015-06-08 DIAGNOSIS — M25559 Pain in unspecified hip: Secondary | ICD-10-CM

## 2015-06-08 DIAGNOSIS — Z85038 Personal history of other malignant neoplasm of large intestine: Secondary | ICD-10-CM

## 2015-06-08 DIAGNOSIS — Z79899 Other long term (current) drug therapy: Secondary | ICD-10-CM | POA: Insufficient documentation

## 2015-06-08 DIAGNOSIS — R600 Localized edema: Secondary | ICD-10-CM

## 2015-06-08 DIAGNOSIS — M954 Acquired deformity of chest and rib: Secondary | ICD-10-CM | POA: Insufficient documentation

## 2015-06-08 LAB — CBC WITH DIFFERENTIAL/PLATELET
Basophils Absolute: 0 10*3/uL (ref 0–0.1)
Basophils Relative: 0 %
Eosinophils Absolute: 0.3 10*3/uL (ref 0–0.7)
Eosinophils Relative: 6 %
HCT: 38.4 % (ref 35.0–47.0)
Hemoglobin: 12.8 g/dL (ref 12.0–16.0)
Lymphocytes Relative: 24 %
Lymphs Abs: 1.3 10*3/uL (ref 1.0–3.6)
MCH: 28.5 pg (ref 26.0–34.0)
MCHC: 33.3 g/dL (ref 32.0–36.0)
MCV: 85.6 fL (ref 80.0–100.0)
Monocytes Absolute: 0.5 10*3/uL (ref 0.2–0.9)
Monocytes Relative: 9 %
Neutro Abs: 3.3 10*3/uL (ref 1.4–6.5)
Neutrophils Relative %: 61 %
Platelets: 316 10*3/uL (ref 150–440)
RBC: 4.49 MIL/uL (ref 3.80–5.20)
RDW: 14.2 % (ref 11.5–14.5)
WBC: 5.5 10*3/uL (ref 3.6–11.0)

## 2015-06-08 LAB — COMPREHENSIVE METABOLIC PANEL
ALT: 12 U/L — ABNORMAL LOW (ref 14–54)
AST: 19 U/L (ref 15–41)
Albumin: 4.2 g/dL (ref 3.5–5.0)
Alkaline Phosphatase: 67 U/L (ref 38–126)
Anion gap: 9 (ref 5–15)
BUN: 20 mg/dL (ref 6–20)
CO2: 24 mmol/L (ref 22–32)
Calcium: 8.9 mg/dL (ref 8.9–10.3)
Chloride: 106 mmol/L (ref 101–111)
Creatinine, Ser: 0.64 mg/dL (ref 0.44–1.00)
GFR calc Af Amer: >60 mL/min (ref >60)
GFR calc non Af Amer: >60 mL/min (ref >60)
Glucose, Bld: 100 mg/dL — ABNORMAL HIGH (ref 65–99)
Potassium: 4.1 mmol/L (ref 3.5–5.1)
Sodium: 139 mmol/L (ref 135–145)
Total Bilirubin: 1 mg/dL (ref 0.3–1.2)
Total Protein: 7.1 g/dL (ref 6.5–8.1)

## 2015-06-08 NOTE — Progress Notes (Signed)
Here for follow-up of colon cancer. Patient states that she has been doing well and offers no complaints today.

## 2015-06-08 NOTE — Progress Notes (Signed)
[facility] day:  06/08/2015  Chief Complaint: Marilyn Romero is a 81 y.o. female with a history of stage II colon cancer who is seen for 6 month reassessment.  HPI: The patient was last seen in the medical oncology clinic on 11/10/2014.  At that time, she was seen for initial assessment by me.  Symptomatically she was doing well. She denied any complaint except for rib prominence (left lower) weight was stable. She denied any melena or hematochezia.  Labs included a hematocrit 39.6, hemoglobin 13, MCV 87, platelets 303,000, white count 7600 with ANC 5200. Comprehensive metabolic panel was normal. CEA was 2.4.  CXR for prominent left lower ribs on 11/10/2014 revealed no destructive osseous lesion identified by radiography.  There was posttraumatic deformity of the left posterior sixth rib with associated pulmonary parenchymal scarring. If there was persistent clinical concern for chest wall mass, CT with contrast should be obtained.  During the interim she states "I think, I'm okay". She comments that the right side has always been larger than the left side for a "long time". She notes the possibility of a hip replacement in the future. She notes pain going down her hip and across. He went to the pain medication physician.  An injection helped.  Past Medical History  Diagnosis Date  . Cancer of sigmoid colon ([place]) 12/08/2014    Past Surgical History  Procedure Laterality Date  . Colon resection    . Hip surgery Right     Family History  Problem Relation Age of Onset  . Early death Mother   . Arthritis Father   . Hypertension Father     Social History:  reports that she has never smoked. She does not have any smokeless tobacco history on file. She reports that she does not drink alcohol or use illicit drugs.  The patient is alone today.  Allergies: No Known Allergies  Current Medications: Current Outpatient Prescriptions  Medication Sig  Dispense Refill  . cefUROXime (CEFTIN) 250 MG tablet Take 1 tablet (250 mg total) by mouth 2 (two) times daily with a meal. 14 tablet 0  . chlorhexidine (PERIDEX) 0.12 % solution   0  . desipramine (NOPRAMIN) 10 MG tablet TK 1 T PO QHS  11  . FLUZONE HIGH-DOSE 0.5 ML SUSY ADM 0.5ML IM UTD  0  . ibuprofen (ADVIL,MOTRIN) 200 MG tablet Take 200 mg by mouth every 6 (six) hours as needed.    . metroNIDAZOLE (FLAGYL) 500 MG tablet TK 1 T PO  BID  0  . traMADol (ULTRAM) 50 MG tablet Take 50 mg by mouth every 4 (four) hours as needed.     No current facility-administered medications for this visit.    Review of Systems:  GENERAL:  Feels "ok".  No fevers, sweats or weight loss. PERFORMANCE STATUS (ECOG):  1 HEENT:  No visual changes, runny nose, sore throat, mouth sores or tenderness. Lungs: No shortness of breath or cough.  No hemoptysis. Cardiac:  No chest pain, palpitations, orthopnea, or PND. GI:  No nausea, vomiting, diarrhea, constipation, melena or hematochezia. GU:  No urgency, frequency, dysuria, or hematuria. Musculoskeletal:  Hip pain.  No back pain.  No muscle tenderness. Extremities:  No pain or swelling. Skin:  No rashes or skin changes. Neuro:  No headache, numbness or weakness, balance or coordination issues. Endocrine:  No diabetes, thyroid issues, hot flashes or night sweats. Psych:  No mood changes, depression or anxiety. Pain:  No focal pain. Review of systems:  All other systems reviewed and found to be negative.  Physical Exam: Blood pressure 167/88, pulse 73, temperature 96.5 F (35.8 C), temperature source Tympanic, resp. rate 18, height '5\' 5"'  (1.651 m), weight 159 lb 2.8 oz (72.2 kg). GENERAL:  Well developed, well nourished, elderly woman sitting comfortably in the exam room in no acute distress. MENTAL STATUS:  Alert and oriented to person, place and time. HEAD:  Wavy blonde hair.  Normocephalic, atraumatic, face symmetric, no Cushingoid features. EYES:  Brown  eyes.  Pupils equal round and reactive to light and accomodation.  No conjunctivitis or scleral icterus. ENT:  Oropharynx clear without lesion.  Tongue normal. Mucous membranes dry.  RESPIRATORY:  Clear to auscultation without rales, wheezes or rhonchi. CARDIOVASCULAR:  Regular rate and rhythm without murmur, rub or gallop. ABDOMEN:  Soft, non-tender, with active bowel sounds, and no hepatosplenomegaly.  No masses. SKIN:  No rashes, ulcers or lesions. EXTREMITIES: Lower extremity chronic edema.  No skin discoloration or tenderness.  No palpable cords. LYMPH NODES: No palpable cervical, supraclavicular, axillary or inguinal adenopathy  NEUROLOGICAL: Unremarkable. PSYCH:  Appropriate.  Appointment on 06/08/2015  Component Date Value Ref Range Status  . WBC 06/08/2015 5.5  3.6 - 11.0 K/uL Final  . RBC 06/08/2015 4.49  3.80 - 5.20 MIL/uL Final  . Hemoglobin 06/08/2015 12.8  12.0 - 16.0 g/dL Final  . HCT 06/08/2015 38.4  35.0 - 47.0 % Final  . MCV 06/08/2015 85.6  80.0 - 100.0 fL Final  . MCH 06/08/2015 28.5  26.0 - 34.0 pg Final  . MCHC 06/08/2015 33.3  32.0 - 36.0 g/dL Final  . RDW 06/08/2015 14.2  11.5 - 14.5 % Final  . Platelets 06/08/2015 316  150 - 440 K/uL Final  . Neutrophils Relative % 06/08/2015 61   Final  . Neutro Abs 06/08/2015 3.3  1.4 - 6.5 K/uL Final  . Lymphocytes Relative 06/08/2015 24   Final  . Lymphs Abs 06/08/2015 1.3  1.0 - 3.6 K/uL Final  . Monocytes Relative 06/08/2015 9   Final  . Monocytes Absolute 06/08/2015 0.5  0.2 - 0.9 K/uL Final  . Eosinophils Relative 06/08/2015 6   Final  . Eosinophils Absolute 06/08/2015 0.3  0 - 0.7 K/uL Final  . Basophils Relative 06/08/2015 0   Final  . Basophils Absolute 06/08/2015 0.0  0 - 0.1 K/uL Final  . Sodium 06/08/2015 139  135 - 145 mmol/L Final  . Potassium 06/08/2015 4.1  3.5 - 5.1 mmol/L Final  . Chloride 06/08/2015 106  101 - 111 mmol/L Final  . CO2 06/08/2015 24  22 - 32 mmol/L Final  . Glucose, Bld 06/08/2015 100*  65 - 99 mg/dL Final  . BUN 06/08/2015 20  6 - 20 mg/dL Final  . Creatinine, Ser 06/08/2015 0.64  0.44 - 1.00 mg/dL Final  . Calcium 06/08/2015 8.9  8.9 - 10.3 mg/dL Final  . Total Protein 06/08/2015 7.1  6.5 - 8.1 g/dL Final  . Albumin 06/08/2015 4.2  3.5 - 5.0 g/dL Final  . AST 06/08/2015 19  15 - 41 U/L Final  . ALT 06/08/2015 12* 14 - 54 U/L Final  . Alkaline Phosphatase 06/08/2015 67  38 - 126 U/L Final  . Total Bilirubin 06/08/2015 1.0  0.3 - 1.2 mg/dL Final  . GFR calc non Af Amer 06/08/2015 >60  >60 mL/min Final  . GFR calc Af Amer 06/08/2015 >60  >60 mL/min Final   Comment: (NOTE) The  eGFR has been calculated using the CKD EPI equation. This calculation has not been validated in all clinical situations. eGFR's persistently <60 mL/min signify possible Chronic Kidney Disease.   . Anion gap 06/08/2015 9  5 - 15 Final    Assessment:  Marilyn Romero is a 81 y.o. female with a history of stage II sigmoid colon cancer.  She underwent hemicolectomy in 10/01/2011.  Pathology revealed a 3.5 cm moderately differentiated adenocarcinoma that invaded through the muscularis propria into the pericolic tissue.  There was lymphovascular and perineural invasion. Ten nodes were negative.  Pathologic stage was T3N0M0.  CEA was 1.4 on 12/07/2011 and 3.0 on 06/08/2015.  She enrolled on NSABP P-5 which was a randomization between preventative daily rosuvastatin/Crestor versus placebo.  The study was eventualy closed secondary to poor accrual.  She notes a history of a benign lung tumor removal in 1974.  She denies any respiratory symptoms.  Symptomatically, she has issues with her hip.  She has received steroid injections which help.  She denies any abdominal symptoms.  She denies any melena or hematochezia.  Plan: 1.  Labs today: CBC with diff, CMP, CEA. 2.  Consider follow-up CT scan if any concerning symptoms. 3.  RTC in 6 months for MD assessment and labs (CBC with diff, CMP, CEA).    Lequita Asal, MD  06/08/2015, 11:17 AM

## 2015-06-09 LAB — CEA: CEA: 3 ng/mL (ref 0.0–4.7)

## 2015-08-15 DIAGNOSIS — Z96641 Presence of right artificial hip joint: Secondary | ICD-10-CM | POA: Insufficient documentation

## 2015-08-15 DIAGNOSIS — M1610 Unilateral primary osteoarthritis, unspecified hip: Secondary | ICD-10-CM | POA: Insufficient documentation

## 2015-08-15 DIAGNOSIS — Z96649 Presence of unspecified artificial hip joint: Secondary | ICD-10-CM

## 2015-08-15 DIAGNOSIS — T8484XA Pain due to internal orthopedic prosthetic devices, implants and grafts, initial encounter: Secondary | ICD-10-CM | POA: Insufficient documentation

## 2015-08-15 HISTORY — DX: Presence of right artificial hip joint: Z96.641

## 2015-09-10 ENCOUNTER — Encounter: Payer: Self-pay | Admitting: Hematology and Oncology

## 2015-10-04 ENCOUNTER — Ambulatory Visit: Payer: Medicare Other | Attending: Pain Medicine | Admitting: Pain Medicine

## 2015-10-04 ENCOUNTER — Encounter: Payer: Self-pay | Admitting: Pain Medicine

## 2015-10-04 VITALS — BP 183/81 | HR 67 | Temp 97.6°F | Resp 16 | Ht 65.0 in | Wt 150.0 lb

## 2015-10-04 DIAGNOSIS — M5136 Other intervertebral disc degeneration, lumbar region: Secondary | ICD-10-CM | POA: Diagnosis not present

## 2015-10-04 DIAGNOSIS — Z96649 Presence of unspecified artificial hip joint: Secondary | ICD-10-CM | POA: Diagnosis not present

## 2015-10-04 DIAGNOSIS — M7918 Myalgia, other site: Secondary | ICD-10-CM | POA: Insufficient documentation

## 2015-10-04 DIAGNOSIS — M5416 Radiculopathy, lumbar region: Secondary | ICD-10-CM

## 2015-10-04 DIAGNOSIS — M62838 Other muscle spasm: Secondary | ICD-10-CM | POA: Diagnosis not present

## 2015-10-04 DIAGNOSIS — M533 Sacrococcygeal disorders, not elsewhere classified: Secondary | ICD-10-CM | POA: Insufficient documentation

## 2015-10-04 DIAGNOSIS — M79606 Pain in leg, unspecified: Secondary | ICD-10-CM | POA: Diagnosis present

## 2015-10-04 DIAGNOSIS — M160 Bilateral primary osteoarthritis of hip: Secondary | ICD-10-CM | POA: Diagnosis not present

## 2015-10-04 DIAGNOSIS — M7060 Trochanteric bursitis, unspecified hip: Secondary | ICD-10-CM | POA: Insufficient documentation

## 2015-10-04 DIAGNOSIS — M47816 Spondylosis without myelopathy or radiculopathy, lumbar region: Secondary | ICD-10-CM

## 2015-10-04 DIAGNOSIS — M545 Low back pain: Secondary | ICD-10-CM | POA: Diagnosis present

## 2015-10-04 DIAGNOSIS — M7061 Trochanteric bursitis, right hip: Secondary | ICD-10-CM

## 2015-10-04 HISTORY — DX: Radiculopathy, lumbar region: M54.16

## 2015-10-04 NOTE — Progress Notes (Signed)
Subjective:    Patient ID: Marilyn Romero, female    DOB: November 23, 1933, 82 y.o.   MRN: [id number]  HPI  The patient is a 82 year old female who returns to pain management for further evaluation and treatment of pain involving the lower back lower extremity region especially the right lower extremity region. The patient iswithout trauma or significant change in events of daily living the call significant change in symptomatology. The patient stated that she had significant relief of pain following previous treatment in pain management Center and is returning to undergo additional treatment including interventional treatment in attempt to decrease the pain began. We discussed patient's condition and decision has been made to proceed with interventional treatment at time return appointment consisting of block of nerves to the sacroiliac joint. We informed patient that we will consider additional modifications of treatment regimen as well as other interventional treatment pending response to treatment and follow-up evaluation.. The patient previously underwent sacroiliac joint injection with significant improvement of her pain which provided patient with significant relief of pain for a very long period of time. We will proceed with block of nerves to the sacroiliac joint at time of return appointment. All agreed to suggested treatment plan      Review of Systems     Objective:   Physical Exam  There was tenderness over the splenius capitis and occipitalis must ensure regions of mild degree. There was mild tenderness of the cervical facet cervical paraspinal musculature region. Palpation over the splenius capitis and occipitalis musculature regions reproduce mild discomfort on the left as well as on the right. Patient over the acromioclavicular and glenohumeral joint regions reproduce mild discomfort. The patient was able to perform drop test without difficulty. There was unremarkable Spurling's  maneuver. Tinel and Phalen's maneuver were without increased pain of significant degree. Palpation over the thoracic region thoracic facet region was with evidence of muscle spasm of the lower thoracic region with no crepitus of the thoracic region noted. Over the lumbar paraspinal muscular region lumbar facet region was with moderate discomfort left greater than right with palpation over the lumbar facets reproducing moderate discomfort with lateral bending and extension and palpation of the lumbar facets right greater than the left. There was tenderness to palpation of the PSIS and PII S region of moderately severe degree on the right compared to the left. There was mild to moderate tenderness of the greater trochanteric region and iliotibial band region. There was increase of pain with pressure applied to the ileum with patient in lateral decubitus position right greater than the left. The patient appeared to be with no sensory deficit of dermatomal distribution detected. Straight leg raise was tolerates approximately 30 without an increase of pain with dorsiflexion noted. DTRs were difficult to elicit. There was negative clonus negative Homans. Abdomen was nontender with no costovertebral tenderness noted      Assessment & Plan:   Sacroiliac joint dysfunction  Degenerative disc disease lumbar spine Lumbar facet syndrome  Degenerative joint disease hips Status post total hip replacement  Obturator muscle spasm    PLAN  Continue present medications.  Block of nerves to the sacroiliac joint to be performed at time of return appointment  F/U PCP  Dr. Gayland Curry for evaliation of  BP and general medical  condition.  F/U surgical evaluation. May consider pending further evaluation  F/U neurological evaluation. May consider PNCV EMG studies and other studies  May consider radiofrequency rhizolysis or intraspinal procedures pending response to present  treatment and F/U  evaluation.  Patient to call Pain Management Center should patient have concerns prior to scheduled return appointment.

## 2015-10-04 NOTE — Patient Instructions (Addendum)
Continue present medications.  Block of nerves to the sacroiliac joint to be performed at time of return appointment  F/U PCP  Dr. Gayland Curry for evaliation of  BP and general medical  condition.  F/U surgical evaluation. May consider pending further evaluation  F/U neurological evaluation. May consider [facility] EMG studies and other studies  May consider radiofrequency rhizolysis or intraspinal procedures pending response to present treatment and F/U evaluation.  Patient to call [facility] should patient have concerns prior to scheduled return appointment. Sacroiliac (SI) Joint Injection Patient Information  Description: The sacroiliac joint connects the scrum (very low back and tailbone) to the ilium (a pelvic bone which also forms half of the hip joint).  Normally this joint experiences very little motion.  When this joint becomes inflamed or unstable low back and or hip and pelvis pain may result.  Injection of this joint with local anesthetics (numbing medicines) and steroids can provide diagnostic information and reduce pain.  This injection is performed with the aid of x-ray guidance into the tailbone area while you are lying on your stomach.   You may experience an electrical sensation down the leg while this is being done.  You may also experience numbness.  We also may ask if we are reproducing your normal pain during the injection.  Conditions which may be treated SI injection:   Low back, buttock, hip or leg pain  Preparation for the Injection:  1. Do not eat any solid food or dairy products within 8 hours of your appointment.  2. You may drink clear liquids up to 3 hours before appointment.  Clear liquids include water, black coffee, juice or soda.  No milk or cream please. 3. You may take your regular medications, including pain medications with a sip of water before your appointment.  Diabetics should hold regular insulin (if take separately) and take 1/2  normal NPH dose the morning of the procedure.  Carry some sugar containing items with you to your appointment. 4. A driver must accompany you and be prepared to drive you home after your procedure. 5. Bring all of your current medications with you. 6. An IV may be inserted and sedation may be given at the discretion of the physician. 7. A blood pressure cuff, EKG and other monitors will often be applied during the procedure.  Some patients may need to have extra oxygen administered for a short period.  8. You will be asked to provide medical information, including your allergies, prior to the procedure.  We must know immediately if you are taking blood thinners (like Coumadin/Warfarin) or if you are allergic to IV iodine contrast (dye).  We must know if you could possible be pregnant.  Possible side effects:   Bleeding from needle site  Infection (rare, may require surgery)  Nerve injury (rare)  Numbness & tingling (temporary)  A brief convulsion or seizure  Light-headedness (temporary)  Pain at injection site (several days)  Decreased blood pressure (temporary)  Weakness in the leg (temporary)   Call if you experience:   New onset weakness or numbness of an extremity below the injection site that last more than 8 hours.  Hives or difficulty breathing ( go to the emergency room)  Inflammation or drainage at the injection site  Any new symptoms which are concerning to you  Please note:  Although the local anesthetic injected can often make your back/ hip/ buttock/ leg feel good for several hours after the injections, the pain will  likely return.  It takes 3-7 days for steroids to work in the sacroiliac area.  You may not notice any pain relief for at least that one week.  If effective, we will often do a series of three injections spaced 3-6 weeks apart to maximally decrease your pain.  After the initial series, we generally will wait some months before a repeat injection  of the same type.  If you have any questions, please call [phone] [facility]  Pain Management Discharge Instructions  General Discharge Instructions :  If you need to reach your doctor call: Monday-Friday 8:00 am - 4:00 pm at [phone] or toll free [phone].  After clinic hours [phone] to have operator reach doctor.  Bring all of your medication bottles to all your appointments in the pain clinic.  To cancel or reschedule your appointment with Pain Management please remember to call 24 hours in advance to avoid a fee.  Refer to the educational materials which you have been given on: General Risks, I had my Procedure. Discharge Instructions, Post Sedation.  Post Procedure Instructions:  The drugs you were given will stay in your system until tomorrow, so for the next 24 hours you should not drive, make any legal decisions or drink any alcoholic beverages.  You may eat anything you prefer, but it is better to start with liquids then soups and crackers, and gradually work up to solid foods.  Please notify your doctor immediately if you have any unusual bleeding, trouble breathing or pain that is not related to your normal pain.  Depending on the type of procedure that was done, some parts of your body may feel week and/or numb.  This usually clears up by tonight or the next day.  Walk with the use of an assistive device or accompanied by an adult for the 24 hours.  You may use ice on the affected area for the first 24 hours.  Put ice in a Ziploc bag and cover with a towel and place against area 15 minutes on 15 minutes off.  You may switch to heat after 24 hours.GENERAL RISKS AND COMPLICATIONS  What are the risk, side effects and possible complications? Generally speaking, most procedures are safe.  However, with any procedure there are risks, side effects, and the possibility of complications.  The risks and complications are  dependent upon the sites that are lesioned, or the type of nerve block to be performed.  The closer the procedure is to the spine, the more serious the risks are.  Great care is taken when placing the radio frequency needles, block needles or lesioning probes, but sometimes complications can occur. 1. Infection: Any time there is an injection through the skin, there is a risk of infection.  This is why sterile conditions are used for these blocks.  There are four possible types of infection. 1. Localized skin infection. 2. Central Nervous System Infection-This can be in the form of Meningitis, which can be deadly. 3. Epidural Infections-This can be in the form of an epidural abscess, which can cause pressure inside of the spine, causing compression of the spinal cord with subsequent paralysis. This would require an emergency surgery to decompress, and there are no guarantees that the patient would recover from the paralysis. 4. Discitis-This is an infection of the intervertebral discs.  It occurs in about 1% of discography procedures.  It is difficult to treat and it may lead to surgery.  2. Pain: the needles have to go through skin and soft tissues, will cause soreness.       3. Damage to internal structures:  The nerves to be lesioned may be near blood vessels or    other nerves which can be potentially damaged.       4. Bleeding: Bleeding is more common if the patient is taking blood thinners such as  aspirin, Coumadin, Ticiid, Plavix, etc., or if he/she have some genetic predisposition  such as hemophilia. Bleeding into the spinal canal can cause compression of the spinal  cord with subsequent paralysis.  This would require an emergency surgery to  decompress and there are no guarantees that the patient would recover from the  paralysis.       5. Pneumothorax:  Puncturing of a lung is a possibility, every time a needle is introduced in  the area of the chest or upper back.  Pneumothorax refers  to free air around the  collapsed lung(s), inside of the thoracic cavity (chest cavity).  Another two possible  complications related to a similar event would include: Hemothorax and Chylothorax.   These are variations of the Pneumothorax, where instead of air around the collapsed  lung(s), you may have blood or chyle, respectively.       6. Spinal headaches: They may occur with any procedures in the area of the spine.       7. Persistent CSF (Cerebro-Spinal Fluid) leakage: This is a rare problem, but may occur  with prolonged intrathecal or epidural catheters either due to the formation of a fistulous  track or a dural tear.       8. Nerve damage: By working so close to the spinal cord, there is always a possibility of  nerve damage, which could be as serious as a permanent spinal cord injury with  paralysis.       9. Death:  Although rare, severe deadly allergic reactions known as "Anaphylactic  reaction" can occur to any of the medications used.      10. Worsening of the symptoms:  We can always make thing worse.  What are the chances of something like this happening? Chances of any of this occuring are extremely low.  By statistics, you have more of a chance of getting killed in a motor vehicle accident: while driving to the hospital than any of the above occurring .  Nevertheless, you should be aware that they are possibilities.  In general, it is similar to taking a shower.  Everybody knows that you can slip, hit your head and get killed.  Does that mean that you should not shower again?  Nevertheless always keep in mind that statistics do not mean anything if you happen to be on the wrong side of them.  Even if a procedure has a 1 (one) in a 1,000,000 (million) chance of going wrong, it you happen to be that one..Also, keep in mind that by statistics, you have more of a chance of having something go wrong when taking medications.  Who should not have this procedure? If you are on a blood thinning  medication (e.g. Coumadin, Plavix, see list of "Blood Thinners"), or if you have an active infection going on, you should not have the procedure.  If you are taking any blood thinners, please inform your physician.  How should I prepare for this procedure?  Do not eat or drink anything at least six hours prior to the procedure.  Bring a driver  with you .  It cannot be a taxi.  Come accompanied by an adult that can drive you back, and that is strong enough to help you if your legs get weak or numb from the local anesthetic.  Take all of your medicines the morning of the procedure with just enough water to swallow them.  If you have diabetes, make sure that you are scheduled to have your procedure done first thing in the morning, whenever possible.  If you have diabetes, take only half of your insulin dose and notify our nurse that you have done so as soon as you arrive at the clinic.  If you are diabetic, but only take blood sugar pills (oral hypoglycemic), then do not take them on the morning of your procedure.  You may take them after you have had the procedure.  Do not take aspirin or any aspirin-containing medications, at least eleven (11) days prior to the procedure.  They may prolong bleeding.  Wear loose fitting clothing that may be easy to take off and that you would not mind if it got stained with Betadine or blood.  Do not wear any jewelry or perfume  Remove any nail coloring.  It will interfere with some of our monitoring equipment.  NOTE: Remember that this is not meant to be interpreted as a complete list of all possible complications.  Unforeseen problems may occur.  BLOOD THINNERS The following drugs contain aspirin or other products, which can cause increased bleeding during surgery and should not be taken for 2 weeks prior to and 1 week after surgery.  If you should need take something for relief of minor pain, you may take acetaminophen which is found in Tylenol,m  Datril, Anacin-3 and Panadol. It is not blood thinner. The products listed below are.  Do not take any of the products listed below in addition to any listed on your instruction sheet.  A.P.C or A.P.C with Codeine Codeine Phosphate Capsules #3 Ibuprofen Ridaura  ABC compound Congesprin Imuran rimadil  Advil Cope Indocin Robaxisal  Alka-Seltzer Effervescent Pain Reliever and Antacid Coricidin or Coricidin-D  Indomethacin Rufen  Alka-Seltzer plus Cold Medicine Cosprin Ketoprofen S-A-C Tablets  Anacin Analgesic Tablets or Capsules Coumadin Korlgesic Salflex  Anacin Extra Strength Analgesic tablets or capsules CP-2 Tablets Lanoril Salicylate  Anaprox Cuprimine Capsules Levenox Salocol  Anexsia-D Dalteparin Magan Salsalate  Anodynos Darvon compound Magnesium Salicylate Sine-off  Ansaid Dasin Capsules Magsal Sodium Salicylate  Anturane Depen Capsules Marnal Soma  APF Arthritis pain formula Dewitt's Pills Measurin Stanback  Argesic Dia-Gesic Meclofenamic Sulfinpyrazone  Arthritis Bayer Timed Release Aspirin Diclofenac Meclomen Sulindac  Arthritis pain formula Anacin Dicumarol Medipren Supac  Analgesic (Safety coated) Arthralgen Diffunasal Mefanamic Suprofen  Arthritis Strength Bufferin Dihydrocodeine Mepro Compound Suprol  Arthropan liquid Dopirydamole Methcarbomol with Aspirin Synalgos  ASA tablets/Enseals Disalcid Micrainin Tagament  Ascriptin Doan's Midol Talwin  Ascriptin A/D Dolene Mobidin Tanderil  Ascriptin Extra Strength Dolobid Moblgesic Ticlid  Ascriptin with Codeine Doloprin or Doloprin with Codeine Momentum Tolectin  Asperbuf Duoprin Mono-gesic Trendar  Aspergum Duradyne Motrin or Motrin IB Triminicin  Aspirin plain, buffered or enteric coated Durasal Myochrisine Trigesic  Aspirin Suppositories Easprin Nalfon Trillsate  Aspirin with Codeine Ecotrin Regular or Extra Strength Naprosyn Uracel  Atromid-S Efficin Naproxen Ursinus  Auranofin Capsules Elmiron Neocylate Vanquish   Axotal Emagrin Norgesic Verin  Azathioprine Empirin or Empirin with Codeine Normiflo Vitamin E  Azolid Emprazil Nuprin Voltaren  Bayer Aspirin plain, buffered or children's or timed BC Tablets or powders  Encaprin Orgaran Warfarin Sodium  Buff-a-Comp Enoxaparin Orudis Zorpin  Buff-a-Comp with Codeine Equegesic Os-Cal-Gesic   Buffaprin Excedrin plain, buffered or Extra Strength Oxalid   Bufferin Arthritis Strength Feldene Oxphenbutazone   Bufferin plain or Extra Strength Feldene Capsules Oxycodone with Aspirin   Bufferin with Codeine Fenoprofen Fenoprofen Pabalate or Pabalate-SF   Buffets II Flogesic Panagesic   Buffinol plain or Extra Strength Florinal or Florinal with Codeine Panwarfarin   Buf-Tabs Flurbiprofen Penicillamine   Butalbital Compound Four-way cold tablets Penicillin   Butazolidin Fragmin Pepto-Bismol   Carbenicillin Geminisyn Percodan   Carna Arthritis Reliever Geopen Persantine   Carprofen Gold's salt Persistin   Chloramphenicol Goody's Phenylbutazone   Chloromycetin Haltrain Piroxlcam   Clmetidine heparin Plaquenil   Cllnoril Hyco-pap Ponstel   Clofibrate Hydroxy chloroquine Propoxyphen         Before stopping any of these medications, be sure to consult the physician who ordered them.  Some, such as Coumadin (Warfarin) are ordered to prevent or treat serious conditions such as "deep thrombosis", "pumonary embolisms", and other heart problems.  The amount of time that you may need off of the medication may also vary with the medication and the reason for which you were taking it.  If you are taking any of these medications, please make sure you notify your pain physician before you undergo any procedures.

## 2015-10-04 NOTE — Progress Notes (Signed)
Safety precautions to be maintained throughout the outpatient stay will include: orient to surroundings, keep bed in low position, maintain call bell within reach at all times, provide assistance with transfer out of bed and ambulation.  

## 2015-10-10 ENCOUNTER — Ambulatory Visit: Payer: Medicare Other | Attending: Pain Medicine | Admitting: Pain Medicine

## 2015-10-10 ENCOUNTER — Encounter: Payer: Self-pay | Admitting: Pain Medicine

## 2015-10-10 VITALS — BP 165/75 | HR 65 | Temp 98.1°F | Resp 14 | Ht 65.0 in | Wt 150.0 lb

## 2015-10-10 DIAGNOSIS — M5416 Radiculopathy, lumbar region: Secondary | ICD-10-CM

## 2015-10-10 DIAGNOSIS — M533 Sacrococcygeal disorders, not elsewhere classified: Secondary | ICD-10-CM

## 2015-10-10 DIAGNOSIS — M545 Low back pain: Secondary | ICD-10-CM | POA: Diagnosis present

## 2015-10-10 DIAGNOSIS — M7061 Trochanteric bursitis, right hip: Secondary | ICD-10-CM

## 2015-10-10 DIAGNOSIS — M47816 Spondylosis without myelopathy or radiculopathy, lumbar region: Secondary | ICD-10-CM

## 2015-10-10 DIAGNOSIS — M47896 Other spondylosis, lumbar region: Secondary | ICD-10-CM | POA: Diagnosis not present

## 2015-10-10 DIAGNOSIS — M5136 Other intervertebral disc degeneration, lumbar region: Secondary | ICD-10-CM

## 2015-10-10 DIAGNOSIS — M79606 Pain in leg, unspecified: Secondary | ICD-10-CM | POA: Insufficient documentation

## 2015-10-10 MED ORDER — TRIAMCINOLONE ACETONIDE 40 MG/ML IJ SUSP
INTRAMUSCULAR | Status: AC
  Administered 2015-10-10: 11:00:00
  Filled 2015-10-10: qty 1

## 2015-10-10 MED ORDER — TRIAMCINOLONE ACETONIDE 40 MG/ML IJ SUSP: 40.0000 mg | Freq: Once | INTRAMUSCULAR | Status: DC

## 2015-10-10 MED ORDER — BUPIVACAINE HCL (PF) 0.25 % IJ SOLN
INTRAMUSCULAR | Status: AC
  Administered 2015-10-10: 11:00:00
  Filled 2015-10-10: qty 30

## 2015-10-10 MED ORDER — ORPHENADRINE CITRATE 30 MG/ML IJ SOLN
INTRAMUSCULAR | Status: AC
  Administered 2015-10-10: 11:00:00
  Filled 2015-10-10: qty 2

## 2015-10-10 MED ORDER — MIDAZOLAM HCL 5 MG/5ML IJ SOLN: 5.0000 mg | Freq: Once | INTRAMUSCULAR | Status: DC

## 2015-10-10 MED ORDER — BUPIVACAINE HCL (PF) 0.25 % IJ SOLN: 30.0000 mL | Freq: Once | INTRAMUSCULAR | Status: DC

## 2015-10-10 MED ORDER — CEFUROXIME AXETIL 250 MG PO TABS: 250.0000 mg | ORAL_TABLET | Freq: Two times a day (BID) | ORAL | Status: DC

## 2015-10-10 MED ORDER — ORPHENADRINE CITRATE 30 MG/ML IJ SOLN: 60.0000 mg | Freq: Once | INTRAMUSCULAR | Status: DC

## 2015-10-10 MED ORDER — FENTANYL CITRATE (PF) 100 MCG/2ML IJ SOLN
INTRAMUSCULAR | Status: AC
  Administered 2015-10-10: 100 ug via INTRAVENOUS
  Filled 2015-10-10: qty 2

## 2015-10-10 MED ORDER — LACTATED RINGERS IV SOLN: 1000.0000 mL | INTRAVENOUS | Status: DC

## 2015-10-10 MED ORDER — CEFAZOLIN SODIUM 1-5 GM-% IV SOLN: 1.0000 g | Freq: Once | INTRAVENOUS | Status: DC

## 2015-10-10 MED ORDER — MIDAZOLAM HCL 5 MG/5ML IJ SOLN
INTRAMUSCULAR | Status: DC
Start: 2015-10-10 — End: 2015-10-10
  Filled 2015-10-10: qty 5

## 2015-10-10 MED ORDER — CEFAZOLIN SODIUM 1 G IJ SOLR
INTRAMUSCULAR | Status: AC
  Administered 2015-10-10: 11:00:00
  Filled 2015-10-10: qty 10

## 2015-10-10 MED ORDER — FENTANYL CITRATE (PF) 100 MCG/2ML IJ SOLN: 100.0000 ug | Freq: Once | INTRAMUSCULAR | Status: DC

## 2015-10-10 NOTE — Patient Instructions (Addendum)
Continue present medications. Please obtain your antibiotic Ceftin today and begin taking Ceftin antibiotic as prescribed  F/U PCP  Dr. Gayland Curry for evaliation of  BP and general medical  condition.  F/U surgical evaluation. May consider pending further evaluation  F/U neurological evaluation. May consider PN[facility] EMG studies and other studies  May consider radiofrequency rhizolysis or intraspinal procedures pending response to present treatment and F/U evaluation.  Patient to call Pain Management Center should patient have concerns prior to scheduled return appointment.GENERAL RISKS AND COMPLICATIONS  What are the risk, side effects and possible complications? Generally speaking, most procedures are safe.  However, with any procedure there are risks, side effects, and the possibility of complications.  The risks and complications are dependent upon the sites that are lesioned, or the type of nerve block to be performed.  The closer the procedure is to the spine, the more serious the risks are.  Great care is taken when placing the radio frequency needles, block needles or lesioning probes, but sometimes complications can occur. 1. Infection: Any time there is an injection through the skin, there is a risk of infection.  This is why sterile conditions are used for these blocks.  There are four possible types of infection. 1. Localized skin infection. 2. Central Nervous System Infection-This can be in the form of Meningitis, which can be deadly. 3. Epidural Infections-This can be in the form of an epidural abscess, which can cause pressure inside of the spine, causing compression of the spinal cord with subsequent paralysis. This would require an emergency surgery to decompress, and there are no guarantees that the patient would recover from the paralysis. 4. Discitis-This is an infection of the intervertebral discs.  It occurs in about 1% of discography procedures.  It is difficult to treat  and it may lead to surgery.        2. Pain: the needles have to go through skin and soft tissues, will cause soreness.       3. Damage to internal structures:  The nerves to be lesioned may be near blood vessels or    other nerves which can be potentially damaged.       4. Bleeding: Bleeding is more common if the patient is taking blood thinners such as  aspirin, Coumadin, Ticiid, Plavix, etc., or if he/she have some genetic predisposition  such as hemophilia. Bleeding into the spinal canal can cause compression of the spinal  cord with subsequent paralysis.  This would require an emergency surgery to  decompress and there are no guarantees that the patient would recover from the  paralysis.       5. Pneumothorax:  Puncturing of a lung is a possibility, every time a needle is introduced in  the area of the chest or upper back.  Pneumothorax refers to free air around the  collapsed lung(s), inside of the thoracic cavity (chest cavity).  Another two possible  complications related to a similar event would include: Hemothorax and Chylothorax.   These are variations of the Pneumothorax, where instead of air around the collapsed  lung(s), you may have blood or chyle, respectively.       6. Spinal headaches: They may occur with any procedures in the area of the spine.       7. Persistent CSF (Cerebro-Spinal Fluid) leakage: This is a rare problem, but may occur  with prolonged intrathecal or epidural catheters either due to the formation of a fistulous  track or a dural tear.  8. Nerve damage: By working so close to the spinal cord, there is always a possibility of  nerve damage, which could be as serious as a permanent spinal cord injury with  paralysis.       9. Death:  Although rare, severe deadly allergic reactions known as "Anaphylactic  reaction" can occur to any of the medications used.      10. Worsening of the symptoms:  We can always make thing worse.  What are the chances of something like this  happening? Chances of any of this occuring are extremely low.  By statistics, you have more of a chance of getting killed in a motor vehicle accident: while driving to the hospital than any of the above occurring .  Nevertheless, you should be aware that they are possibilities.  In general, it is similar to taking a shower.  Everybody knows that you can slip, hit your head and get killed.  Does that mean that you should not shower again?  Nevertheless always keep in mind that statistics do not mean anything if you happen to be on the wrong side of them.  Even if a procedure has a 1 (one) in a 1,000,000 (million) chance of going wrong, it you happen to be that one..Also, keep in mind that by statistics, you have more of a chance of having something go wrong when taking medications.  Who should not have this procedure? If you are on a blood thinning medication (e.g. Coumadin, Plavix, see list of "Blood Thinners"), or if you have an active infection going on, you should not have the procedure.  If you are taking any blood thinners, please inform your physician.  How should I prepare for this procedure?  Do not eat or drink anything at least six hours prior to the procedure.  Bring a driver with you .  It cannot be a taxi.  Come accompanied by an adult that can drive you back, and that is strong enough to help you if your legs get weak or numb from the local anesthetic.  Take all of your medicines the morning of the procedure with just enough water to swallow them.  If you have diabetes, make sure that you are scheduled to have your procedure done first thing in the morning, whenever possible.  If you have diabetes, take only half of your insulin dose and notify our nurse that you have done so as soon as you arrive at the clinic.  If you are diabetic, but only take blood sugar pills (oral hypoglycemic), then do not take them on the morning of your procedure.  You may take them after you have had the  procedure.  Do not take aspirin or any aspirin-containing medications, at least eleven (11) days prior to the procedure.  They may prolong bleeding.  Wear loose fitting clothing that may be easy to take off and that you would not mind if it got stained with Betadine or blood.  Do not wear any jewelry or perfume  Remove any nail coloring.  It will interfere with some of our monitoring equipment.  NOTE: Remember that this is not meant to be interpreted as a complete list of all possible complications.  Unforeseen problems may occur.  BLOOD THINNERS The following drugs contain aspirin or other products, which can cause increased bleeding during surgery and should not be taken for 2 weeks prior to and 1 week after surgery.  If you should need take something for relief of minor  pain, you may take acetaminophen which is found in Tylenol,m Datril, Anacin-3 and Panadol. It is not blood thinner. The products listed below are.  Do not take any of the products listed below in addition to any listed on your instruction sheet.  A.P.C or A.P.C with Codeine Codeine Phosphate Capsules #3 Ibuprofen Ridaura  ABC compound Congesprin Imuran rimadil  Advil Cope Indocin Robaxisal  Alka-Seltzer Effervescent Pain Reliever and Antacid Coricidin or Coricidin-D  Indomethacin Rufen  Alka-Seltzer plus Cold Medicine Cosprin Ketoprofen S-A-C Tablets  Anacin Analgesic Tablets or Capsules Coumadin Korlgesic Salflex  Anacin Extra Strength Analgesic tablets or capsules CP-2 Tablets Lanoril Salicylate  Anaprox Cuprimine Capsules Levenox Salocol  Anexsia-D Dalteparin Magan Salsalate  Anodynos Darvon compound Magnesium Salicylate Sine-off  Ansaid Dasin Capsules Magsal Sodium Salicylate  Anturane Depen Capsules Marnal Soma  APF Arthritis pain formula Dewitt's Pills Measurin Stanback  Argesic Dia-Gesic Meclofenamic Sulfinpyrazone  Arthritis Bayer Timed Release Aspirin Diclofenac Meclomen Sulindac  Arthritis pain formula  Anacin Dicumarol Medipren Supac  Analgesic (Safety coated) Arthralgen Diffunasal Mefanamic Suprofen  Arthritis Strength Bufferin Dihydrocodeine Mepro Compound Suprol  Arthropan liquid Dopirydamole Methcarbomol with Aspirin Synalgos  ASA tablets/Enseals Disalcid Micrainin Tagament  Ascriptin Doan's Midol Talwin  Ascriptin A/D Dolene Mobidin Tanderil  Ascriptin Extra Strength Dolobid Moblgesic Ticlid  Ascriptin with Codeine Doloprin or Doloprin with Codeine Momentum Tolectin  Asperbuf Duoprin Mono-gesic Trendar  Aspergum Duradyne Motrin or Motrin IB Triminicin  Aspirin plain, buffered or enteric coated Durasal Myochrisine Trigesic  Aspirin Suppositories Easprin Nalfon Trillsate  Aspirin with Codeine Ecotrin Regular or Extra Strength Naprosyn Uracel  Atromid-S Efficin Naproxen Ursinus  Auranofin Capsules Elmiron Neocylate Vanquish  Axotal Emagrin Norgesic Verin  Azathioprine Empirin or Empirin with Codeine Normiflo Vitamin E  Azolid Emprazil Nuprin Voltaren  Bayer Aspirin plain, buffered or children's or timed BC Tablets or powders Encaprin Orgaran Warfarin Sodium  Buff-a-Comp Enoxaparin Orudis Zorpin  Buff-a-Comp with Codeine Equegesic Os-Cal-Gesic   Buffaprin Excedrin plain, buffered or Extra Strength Oxalid   Bufferin Arthritis Strength Feldene Oxphenbutazone   Bufferin plain or Extra Strength Feldene Capsules Oxycodone with Aspirin   Bufferin with Codeine Fenoprofen Fenoprofen Pabalate or Pabalate-SF   Buffets II Flogesic Panagesic   Buffinol plain or Extra Strength Florinal or Florinal with Codeine Panwarfarin   Buf-Tabs Flurbiprofen Penicillamine   Butalbital Compound Four-way cold tablets Penicillin   Butazolidin Fragmin Pepto-Bismol   Carbenicillin Geminisyn Percodan   Carna Arthritis Reliever Geopen Persantine   Carprofen Gold's salt Persistin   Chloramphenicol Goody's Phenylbutazone   Chloromycetin Haltrain Piroxlcam   Clmetidine heparin Plaquenil   Cllnoril Hyco-pap  Ponstel   Clofibrate Hydroxy chloroquine Propoxyphen         Before stopping any of these medications, be sure to consult the physician who ordered them.  Some, such as Coumadin (Warfarin) are ordered to prevent or treat serious conditions such as "deep thrombosis", "pumonary embolisms", and other heart problems.  The amount of time that you may need off of the medication may also vary with the medication and the reason for which you were taking it.  If you are taking any of these medications, please make sure you notify your pain physician before you undergo any procedures.

## 2015-10-10 NOTE — Progress Notes (Signed)
Subjective:    Patient ID: Marilyn Romero, female    DOB: July 03, 1934, 82 y.o.   MRN: [id number]  HPI  PROCEDURE:  Block of nerves to the sacroiliac joint.   NOTE:  The patient is a 82 y.o. female who returns to the [facility] for further evaluation and treatment of pain involving the lower back and lower extremity region with pain in the region of the buttocks as well. Prior studies reveal patient to be with degenerative changes of the lumbar spine The patient is with reproduction of severe pain with palpation over the PSIS and PII S regions.   There is concern regarding a significant component of the patient's pain being due to sacroiliac joint dysfunction The risks, benefits, expectations of the procedure have been discussed and explained to the patient who is understanding and willing to proceed with interventional treatment in attempt to decrease severity of patient's symptoms, minimize the risk of medication escalation and  hopefully retard the progression of the patient's symptoms. We will proceed with what is felt to be a medically necessary procedure, block of nerves to the sacroiliac joint.   DESCRIPTION OF PROCEDURE:  Block of nerves to the sacroiliac joint.   The patient was taken to the fluoroscopy suite. With the patient in the prone position with EKG, blood pressure, pulse and pulse oximetry monitoring, IV Versed, IV fentanyl conscious sedation, Betadine prep of proposed entry site was performed.   Block of nerves at the L5 vertebral body level.   With the patient in prone position, under fluoroscopic guidance, a 22 -gauge needle was inserted at the L5 vertebral body level on the eft side. With 15 degrees oblique orientation a 22 -gauge needle was inserted in the region known as Burton's eye or eye of the Scotty dog. Following documentation of needle placement in the area of Burton's eye or eye of the Scotty dog under fluoroscopic guidance, needle placement was then  accomplished at the sacral ala level on the left side.   Needle placement at the sacral ala.   With the patient in prone position under fluoroscopic guidance with AP view of the lumbosacral spine, a 22 -gauge needle was inserted in the region known as the sacral ala on the left side. Following documentation of needle placement on the left side under fluoroscopic guidance needle placement was then accomplished at the S1 foramen level.   Needle placement at the S1 foramen level.   With the patient in prone position under fluoroscopic guidance with AP view of the lumbosacral spine and cephalad orientation, a 22 -gauge needle was inserted at the superior and lateral border of the S1 foramen on the eft side. Following documentation of needle placement at the S1 foramen level on the eft side, needle placement was then accomplished at the S2 foramen level on the left side.   Needle placement at the S2 foramen level.   With the patient in prone position with AP view of the lumbosacral spine with cephalad orientation, a 22 - gauge needle was inserted at the superior and lateral border of the S2 foramen under fluoroscopic guidance on the left side. Following needle placement at the L5 vertebral body level, sacral ala, S1 foramen and S2 foramen on the left side, needle placement was verified on lateral view under fluoroscopic guidance.  Following needle placement documentation on lateral view, each needle was injected with 1 mL of 0.25% bupivacaine and Kenalog.   BLOCK OF THE NERVES TO SACROILIAC JOINT ON  THE RIGHT SIDE The procedure was performed on the right side at the same levels as was performed on the left side and utilizing the same technique as on the left side and was performed under fluoroscopic guidance as on the left side  The patient tolerated the procedure well  A total of 10mg  of Kenalog was utilized for the procedure.   PLAN:  1. Medications: The patient will continue presently  prescribed medications.  2. The patient will be considered for modification of treatment regimen pending response to the procedure performed on today's visit.  3. The patient is to follow-up with primary care physician Dr. Astrid Divine for evaluation of blood pressure and general medical condition following the procedure performed on today's visit.  4. Surgical evaluation as discussed.  5. Neurological evaluation as discussed.  6. The patient may be a candidate for radiofrequency procedures, implantation devices and other treatment pending response to treatment performed on today's visit and follow-up evaluation.  7. The patient has been advised to adhere to proper body mechanics and to avoid activities which may exacerbate the patient's symptoms.   Return appointment to [facility] Management Center as scheduled.    Review of Systems     Objective:   Physical Exam        Assessment & Plan:

## 2015-10-10 NOTE — Progress Notes (Signed)
Safety precautions to be maintained throughout the outpatient stay will include: orient to surroundings, keep bed in low position, maintain call bell within reach at all times, provide assistance with transfer out of bed and ambulation.   Patient here for procedure.

## 2015-10-11 ENCOUNTER — Telehealth: Payer: Self-pay | Admitting: *Deleted

## 2015-10-11 NOTE — Telephone Encounter (Signed)
Patient verbalizes no complications or concerns re; procedure on yesterday.

## 2015-10-31 ENCOUNTER — Ambulatory Visit: Payer: Medicare Other | Attending: Pain Medicine | Admitting: Pain Medicine

## 2015-10-31 ENCOUNTER — Encounter: Payer: Self-pay | Admitting: Pain Medicine

## 2015-10-31 VITALS — BP 156/76 | HR 73 | Temp 97.7°F | Resp 16 | Ht 65.0 in | Wt 148.0 lb

## 2015-10-31 DIAGNOSIS — M5136 Other intervertebral disc degeneration, lumbar region: Secondary | ICD-10-CM

## 2015-10-31 DIAGNOSIS — M545 Low back pain: Secondary | ICD-10-CM | POA: Insufficient documentation

## 2015-10-31 DIAGNOSIS — M792 Neuralgia and neuritis, unspecified: Secondary | ICD-10-CM | POA: Diagnosis not present

## 2015-10-31 DIAGNOSIS — Z96649 Presence of unspecified artificial hip joint: Secondary | ICD-10-CM | POA: Diagnosis not present

## 2015-10-31 DIAGNOSIS — M791 Myalgia: Secondary | ICD-10-CM | POA: Insufficient documentation

## 2015-10-31 DIAGNOSIS — M7061 Trochanteric bursitis, right hip: Secondary | ICD-10-CM

## 2015-10-31 DIAGNOSIS — M533 Sacrococcygeal disorders, not elsewhere classified: Secondary | ICD-10-CM | POA: Diagnosis not present

## 2015-10-31 DIAGNOSIS — M5416 Radiculopathy, lumbar region: Secondary | ICD-10-CM

## 2015-10-31 DIAGNOSIS — M62838 Other muscle spasm: Secondary | ICD-10-CM | POA: Diagnosis not present

## 2015-10-31 DIAGNOSIS — M7060 Trochanteric bursitis, unspecified hip: Secondary | ICD-10-CM | POA: Insufficient documentation

## 2015-10-31 DIAGNOSIS — M79606 Pain in leg, unspecified: Secondary | ICD-10-CM | POA: Diagnosis present

## 2015-10-31 DIAGNOSIS — M160 Bilateral primary osteoarthritis of hip: Secondary | ICD-10-CM | POA: Insufficient documentation

## 2015-10-31 DIAGNOSIS — M7062 Trochanteric bursitis, left hip: Secondary | ICD-10-CM

## 2015-10-31 DIAGNOSIS — M47816 Spondylosis without myelopathy or radiculopathy, lumbar region: Secondary | ICD-10-CM

## 2015-10-31 NOTE — Progress Notes (Signed)
   Subjective:    Patient ID: Marilyn Romero, female    DOB: 1934-06-09, 82 y.o.   MRN: [id number]  HPI  The patient is a 82 year old female who returns to pain management for further evaluation and treatment of pain involving the lower back and lower extremity region.. The patient has had improvement of her lower back pain which had radiated toward the buttocks. The patient is status post block of nerves to the sacroiliac joint with significant improvement of lower back and lower extremity pain radiating toward the buttocks. The patient admits to significant pain involving the greater trochanteric region this time and we will proceed with scheduling patient for greater trochanteric bursa injection and will consider additional modifications of treatment pending follow-up evaluation. The patient agreed to suggested treatment plan. The patient continues tramadol which is being prescribed by Dr. Pamala Hurry Al[place]ridge. We will prescribe tramadol for patient should Dr. Astrid Divine and patient wished for [place] to do such. At the present time we will schedule patient for injection region greater trochanteric region and region of the hip at time return appointment. All agreed to suggested treatment plan      Review of Systems     Objective:   Physical Exam   There was tenderness of the splenius capitis and occipitalis musculature regions of mild degree with mild tenderness over the region of the cervical facet cervical paraspinal musculature region. There was minimal tenderness of the acromioclavicular and glenohumeral joint regions. The patient was with bilaterally equal grip strength and Tinel and Phalen's maneuver were without increase of pain of significant degree. Palpation over the region of the thoracic area thoracic facet region was without increased pain of significant degree and no crepitus of the thoracic region was noted. Palpation over the lumbar paraspinal musculature region lumbar facet region  reproduced mild discomfort. Palpation of the PSIS and PII S region was with mild discomfort as well straight leg raising was tolerated to 30 without increased pain with dorsiflexion noted. Palpation of the greater trochanteric region and iliotibial band region reproduced severe pain on the right especially . EHL strength appeared to be slightly decreased. No sensory deficit or dermatomal distribution detected. There was negative clonus negative Homans. Abdomen nontender with no costovertebral angle tenderness noted. The predominant portion of patient's pain was reproduced with palpation over the greater trochanteric region and iliotibial band region.     Assessment & Plan:    Greater trochanteric bursitis  Sacroiliac joint dysfunction  Degenerative disc disease lumbar spine  Lumbar facet syndrome  Degenerative joint disease hips Status post total hip replacement  Obturator muscle spasm  Gluteal and piriformis neuralgia      PLAN   Continue present medications.  Hip injection to be performed at time of return appointment  F/U PCP  Dr. Gayland Curry for evaliation of  BP and general medical  condition.  F/U surgical evaluation. May consider pending further evaluation  F/U neurological evaluation. May consider PNCV EMG studies and other studies  May consider radiofrequency rhizolysis or intraspinal procedures pending response to present treatment and F/U evaluation.  Patient to call Pain Management Center should patient have concerns prior to scheduled return appointment.

## 2015-10-31 NOTE — Progress Notes (Signed)
Safety precautions to be maintained throughout the outpatient stay will include: orient to surroundings, keep bed in low position, maintain call bell within reach at all times, provide assistance with transfer out of bed and ambulation.  

## 2015-10-31 NOTE — Patient Instructions (Addendum)
Continue present medications.  Hip injection to be performed at time of return appointment  F/U PCP  Dr. Gayland Curry for evaliation of  BP and general medical  condition.  F/U surgical evaluation. May consider pending further evaluation  F/U neurological evaluation. May consider PNCV EMG studies and other studies  May consider radiofrequency rhizolysis or intraspinal procedures pending response to present treatment and F/U evaluation.  Patient to call Pain Management Center should patient have concerns prior to scheduled return appointment. You may drive yourself for your procedure. Do not eat or drink 2 hours before your appointment.

## 2015-11-09 ENCOUNTER — Ambulatory Visit: Payer: Medicare Other | Attending: Pain Medicine | Admitting: Pain Medicine

## 2015-11-09 ENCOUNTER — Encounter: Payer: Self-pay | Admitting: Pain Medicine

## 2015-11-09 VITALS — BP 152/82 | HR 64 | Temp 97.4°F | Resp 15 | Ht 65.0 in | Wt 150.0 lb

## 2015-11-09 DIAGNOSIS — M25551 Pain in right hip: Secondary | ICD-10-CM | POA: Insufficient documentation

## 2015-11-09 DIAGNOSIS — M79604 Pain in right leg: Secondary | ICD-10-CM | POA: Diagnosis present

## 2015-11-09 DIAGNOSIS — M533 Sacrococcygeal disorders, not elsewhere classified: Secondary | ICD-10-CM

## 2015-11-09 DIAGNOSIS — M5136 Other intervertebral disc degeneration, lumbar region: Secondary | ICD-10-CM

## 2015-11-09 DIAGNOSIS — M7061 Trochanteric bursitis, right hip: Secondary | ICD-10-CM

## 2015-11-09 DIAGNOSIS — M545 Low back pain: Secondary | ICD-10-CM | POA: Insufficient documentation

## 2015-11-09 DIAGNOSIS — M7062 Trochanteric bursitis, left hip: Secondary | ICD-10-CM

## 2015-11-09 DIAGNOSIS — M5416 Radiculopathy, lumbar region: Secondary | ICD-10-CM

## 2015-11-09 DIAGNOSIS — M47816 Spondylosis without myelopathy or radiculopathy, lumbar region: Secondary | ICD-10-CM

## 2015-11-09 MED ORDER — TRIAMCINOLONE ACETONIDE 40 MG/ML IJ SUSP
INTRAMUSCULAR | Status: AC
  Filled 2015-11-09: qty 1

## 2015-11-09 MED ORDER — SODIUM CHLORIDE 0.9 % IJ SOLN
INTRAMUSCULAR | Status: AC
  Administered 2015-11-09: 14:00:00
  Filled 2015-11-09: qty 10

## 2015-11-09 MED ORDER — BUPIVACAINE HCL (PF) 0.25 % IJ SOLN
INTRAMUSCULAR | Status: AC
  Filled 2015-11-09: qty 30

## 2015-11-09 MED ORDER — BUPIVACAINE HCL (PF) 0.25 % IJ SOLN: 30.0000 mL | Freq: Once | INTRAMUSCULAR | Status: DC

## 2015-11-09 MED ORDER — TRIAMCINOLONE ACETONIDE 40 MG/ML IJ SUSP: 40.0000 mg | Freq: Once | INTRAMUSCULAR | Status: DC

## 2015-11-09 NOTE — Progress Notes (Signed)
Safety precautions to be maintained throughout the outpatient stay will include: orient to surroundings, keep bed in low position, maintain call bell within reach at all times, provide assistance with transfer out of bed and ambulation.  

## 2015-11-09 NOTE — Patient Instructions (Addendum)
PLAN   Continue present medications.  F/U PCP  Dr. Gayland Curry for evaliation of  BP and general medical  condition.  F/U surgical evaluation. May consider pending further evaluation  F/U neurological evaluation. May consider PNCV EMG studies and other studies  May consider radiofrequency rhizolysis or intraspinal procedures pending response to present treatment and F/U evaluation.  Patient to call Pain Management Center should patient have concerns prior to scheduled return appointment.Pain Management Discharge Instructions  General Discharge Instructions :  If you need to reach your doctor call: Monday-Friday 8:00 am - 4:00 pm at [phone] or toll free [phone].  After clinic hours [phone] to have operator reach doctor.  Bring all of your medication bottles to all your appointments in the pain clinic.  To cancel or reschedule your appointment with Pain Management please remember to call 24 hours in advance to avoid a fee.  Refer to the educational materials which you have been given on: General Risks, I had my Procedure. Discharge Instructions, Post Sedation.  Post Procedure Instructions:  The drugs you were given will stay in your system until tomorrow, so for the next 24 hours you should not drive, make any legal decisions or drink any alcoholic beverages.  You may eat anything you prefer, but it is better to start with liquids then soups and crackers, and gradually work up to solid foods.  Please notify your doctor immediately if you have any unusual bleeding, trouble breathing or pain that is not related to your normal pain.  Depending on the type of procedure that was done, some parts of your body may feel week and/or numb.  This usually clears up by tonight or the next day.  Walk with the use of an assistive device or accompanied by an adult for the 24 hours.  You may use ice on the affected area for the first 24 hours.  Put ice in a Ziploc bag and cover  with a towel and place against area 15 minutes on 15 minutes off.  You may switch to heat after 24 hours.

## 2015-11-09 NOTE — Progress Notes (Signed)
   Subjective:    Patient ID: Marilyn Romero, female    DOB: 08/17/1933, 82 y.o.   MRN: [id number]  HPI                                       RIGHT GREATER TROCHANTERIC BURSA INJECTION     The patient is a 82 year old female who returns to pain management for further evaluation and treatment of pain involving the lower back and lower extremity region especially the right lower extremity region. The patient is with reproduction of severe pain with palpation over the greater trochanteric region on the right. There is concern regarding patient's pain being due to greater trochanteric bursitis. The risks benefits and expectations of procedure have been discussed and explained to patient who was with understanding and wished to proceed with right greater trochanteric bursa injection in attempt to decrease. Symptoms, minimize progression of symptoms, and avoid the need for more involved treatment. All agreed to suggested treatment plan    Description Of Procedure:  Right Greater Trochanteric Bursa Injection  The patient was placed in the lateral decubitus position. Betadine prep of proposed entry site was accomplished with EKG, blood pressur, e pulse, and pulse oximetry monitors all in place. Identification of landmarks for right greater trochanteric bursa injection was accomplished. Betadine prep of proposed entry site was accomplished. A 22-gauge needle was inserted in the region of the right greater trochanter and a total of 10 cc of preservative-free normal saline with Kenalog was injected for right greater trochanteric bursa injection. Needle was removed myoneural block injections were performed along the right iliotibial band after Betadine prep needle was inserted in the iliotibial band on the right and a total of 5 cc of preservative-free normal saline with Kenalog was injected for myoneural block injections of the right iliotibial band performed 2.   The patient tolerated the procedure  well  A total of 20 mg of Kenalog was utilized for the entire procedure    PLAN   Continue present medications.  F/U PCP  Dr. Gayland Curry for evaliation of  BP and general medical  condition.  F/U surgical evaluation. May consider pending further evaluation  F/U neurological evaluation. May consider PNCV EMG studies and other studies  May consider radiofrequency rhizolysis or intraspinal procedures pending response to present treatment and F/U evaluation.  Patient to call [facility] should patient have concerns prior to scheduled return appointment    Review of Systems     Objective:   Physical Exam        Assessment & Plan:

## 2015-11-10 ENCOUNTER — Telehealth: Payer: Self-pay

## 2015-11-10 NOTE — Telephone Encounter (Signed)
Post procedure phone call.  Left message.  

## 2015-12-05 ENCOUNTER — Ambulatory Visit: Payer: Medicare Other | Admitting: Pain Medicine

## 2015-12-07 ENCOUNTER — Inpatient Hospital Stay: Payer: Medicare Other | Attending: Hematology and Oncology

## 2015-12-07 ENCOUNTER — Inpatient Hospital Stay (HOSPITAL_BASED_OUTPATIENT_CLINIC_OR_DEPARTMENT_OTHER): Payer: Medicare Other | Admitting: Hematology and Oncology

## 2015-12-07 VITALS — BP 148/90 | HR 85 | Temp 95.3°F | Resp 18 | Ht 65.0 in | Wt 146.1 lb

## 2015-12-07 DIAGNOSIS — M719 Bursopathy, unspecified: Secondary | ICD-10-CM | POA: Insufficient documentation

## 2015-12-07 DIAGNOSIS — Z79899 Other long term (current) drug therapy: Secondary | ICD-10-CM

## 2015-12-07 DIAGNOSIS — Z8619 Personal history of other infectious and parasitic diseases: Secondary | ICD-10-CM | POA: Insufficient documentation

## 2015-12-07 DIAGNOSIS — Z9049 Acquired absence of other specified parts of digestive tract: Secondary | ICD-10-CM

## 2015-12-07 DIAGNOSIS — C187 Malignant neoplasm of sigmoid colon: Secondary | ICD-10-CM

## 2015-12-07 DIAGNOSIS — Z85038 Personal history of other malignant neoplasm of large intestine: Secondary | ICD-10-CM

## 2015-12-07 LAB — CBC WITH DIFFERENTIAL/PLATELET
Basophils Absolute: 0.1 10*3/uL (ref 0–0.1)
Basophils Relative: 1 %
Eosinophils Absolute: 0.2 10*3/uL (ref 0–0.7)
Eosinophils Relative: 4 %
HCT: 41 % (ref 35.0–47.0)
Hemoglobin: 13.6 g/dL (ref 12.0–16.0)
Lymphocytes Relative: 21 %
Lymphs Abs: 1.3 10*3/uL (ref 1.0–3.6)
MCH: 29.1 pg (ref 26.0–34.0)
MCHC: 33.1 g/dL (ref 32.0–36.0)
MCV: 87.8 fL (ref 80.0–100.0)
Monocytes Absolute: 0.6 10*3/uL (ref 0.2–0.9)
Monocytes Relative: 11 %
Neutro Abs: 3.8 10*3/uL (ref 1.4–6.5)
Neutrophils Relative %: 63 %
Platelets: 308 10*3/uL (ref 150–440)
RBC: 4.67 MIL/uL (ref 3.80–5.20)
RDW: 14.4 % (ref 11.5–14.5)
WBC: 6 10*3/uL (ref 3.6–11.0)

## 2015-12-07 LAB — COMPREHENSIVE METABOLIC PANEL
ALT: 25 U/L (ref 14–54)
AST: 31 U/L (ref 15–41)
Albumin: 4.3 g/dL (ref 3.5–5.0)
Alkaline Phosphatase: 69 U/L (ref 38–126)
Anion gap: 7 (ref 5–15)
BUN: 21 mg/dL — ABNORMAL HIGH (ref 6–20)
CO2: 22 mmol/L (ref 22–32)
Calcium: 9.1 mg/dL (ref 8.9–10.3)
Chloride: 107 mmol/L (ref 101–111)
Creatinine, Ser: 0.77 mg/dL (ref 0.44–1.00)
GFR calc Af Amer: >60 mL/min (ref >60)
GFR calc non Af Amer: >60 mL/min (ref >60)
Glucose, Bld: 121 mg/dL — ABNORMAL HIGH (ref 65–99)
Potassium: 4.2 mmol/L (ref 3.5–5.1)
Sodium: 136 mmol/L (ref 135–145)
Total Bilirubin: 1.1 mg/dL (ref 0.3–1.2)
Total Protein: 7.3 g/dL (ref 6.5–8.1)
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
## 2015-12-08 LAB — CEA: CEA: 2.8 ng/mL (ref 0.0–4.7)

## 2015-12-09 ENCOUNTER — Ambulatory Visit: Payer: Medicare Other | Attending: Pain Medicine | Admitting: Pain Medicine

## 2015-12-09 ENCOUNTER — Encounter: Payer: Self-pay | Admitting: Pain Medicine

## 2015-12-09 VITALS — BP 156/78 | HR 86 | Temp 97.6°F | Resp 16 | Ht 65.0 in | Wt 146.0 lb

## 2015-12-09 DIAGNOSIS — M7061 Trochanteric bursitis, right hip: Secondary | ICD-10-CM

## 2015-12-09 DIAGNOSIS — M62838 Other muscle spasm: Secondary | ICD-10-CM | POA: Diagnosis not present

## 2015-12-09 DIAGNOSIS — M160 Bilateral primary osteoarthritis of hip: Secondary | ICD-10-CM | POA: Insufficient documentation

## 2015-12-09 DIAGNOSIS — M545 Low back pain: Secondary | ICD-10-CM | POA: Diagnosis present

## 2015-12-09 DIAGNOSIS — M7062 Trochanteric bursitis, left hip: Secondary | ICD-10-CM

## 2015-12-09 DIAGNOSIS — M792 Neuralgia and neuritis, unspecified: Secondary | ICD-10-CM | POA: Insufficient documentation

## 2015-12-09 DIAGNOSIS — M7060 Trochanteric bursitis, unspecified hip: Secondary | ICD-10-CM | POA: Diagnosis not present

## 2015-12-09 DIAGNOSIS — M47816 Spondylosis without myelopathy or radiculopathy, lumbar region: Secondary | ICD-10-CM

## 2015-12-09 DIAGNOSIS — M533 Sacrococcygeal disorders, not elsewhere classified: Secondary | ICD-10-CM | POA: Diagnosis not present

## 2015-12-09 DIAGNOSIS — M5416 Radiculopathy, lumbar region: Secondary | ICD-10-CM

## 2015-12-09 DIAGNOSIS — G588 Other specified mononeuropathies: Secondary | ICD-10-CM | POA: Insufficient documentation

## 2015-12-09 DIAGNOSIS — M5136 Other intervertebral disc degeneration, lumbar region: Secondary | ICD-10-CM | POA: Diagnosis not present

## 2015-12-09 NOTE — Progress Notes (Signed)
   Subjective:    Patient ID: Marilyn Romero, female    DOB: Feb 09, 1934, 82 y.o.   MRN: [id number]  HPI  Patient is a 82 year old female who returns to pain management for evaluation and treatment of pain involving the lower back lower extremity region especially the region of the hips and buttocks. The patient has had severe debilitating pain involving the lower extremity regions and lower back region. The patient is status post greater trochanteric bursa injection at time of return appointment. The patient continues to be with significant relief of pain and is able to perform activities of daily living without experiencing significant pain. Patient is able to obtain restful sleep at night as well. We will continue present treatment and we'll remain available to consider patient for modification of treatment regimen should there be any change in condition. All agreed to suggested treatment plan  Review of Systems     Objective:   Physical Exam  There was tenderness to palpation of paraspinal musculature region of the cervical region cervical facet region a mild degree with mild tenderness of the splenius capitis and occipitalis musculature regions. Palpation over the thoracic region thoracic facet region was attends to palpation with no crepitus of the thoracic region noted. Patient appeared to be with minimal tenderness of the of the acromioclavicular and glenohumeral joint regions. There was unremarkable Spurling's maneuver. The patient was without increase of pain with Tinel and Phalen's maneuver and appeared to be with bilaterally equal grip strength there was tenderness of the thoracic region thoracic facet region of minimal degree with no crepitus of the thoracic region noted. Palpation over the lumbar paraspinal musculatures and lumbar facet region was with mild tenderness to palpation with lateral bending rotation extension and palpation of the lumbar facets reproducing mild discomfort.  Palpation over the PSIS and PII S region reproduced pain of mild degree. There was mild to minimal tenderness of the greater trochanteric region iliotibial band region. Straight leg raise was tolerates approximately 30 without increased pain with dorsiflexion noted. There was surgical scar atrophy of the right gluteal region. No sensory deficit or dermatomal distribution detected. There was negative clonus negative Homans. Abdomen was nontender with no costovertebral tenderness noted no sensory deficit or dermatomal distribution was detected.      Assessment & Plan:    Greater trochanteric bursitis  Sacroiliac joint dysfunction  Degenerative disc disease lumbar spine  Lumbar facet syndrome  Degenerative joint disease hips Status post total hip replacement  Obturator muscle spasm  Gluteal and piriformis neuralgia      PLAN  Continue present medications.  F/U PCP  Dr. Gayland Curry for evaliation of  BP and general medical  condition.  F/U surgical evaluation. May consider pending further evaluation. Patient is without plans for surgical intervention  F/U neurological evaluation. May consider PNCV EMG studies and other studies  May consider radiofrequency rhizolysis or intraspinal procedures pending response to present treatment and F/U evaluation.  Patient continues to do well status post interventional treatment in pain management. Patient to call [facility] should patient have concerns prior to scheduled return appointment.

## 2015-12-09 NOTE — Patient Instructions (Signed)
Continue present medications.  F/U PCP  Dr. Gayland Curry for evaliation of  BP and general medical  condition.  F/U surgical evaluation. May consider pending further evaluation  F/U neurological evaluation. May consider PNCV EMG studies and other studies  May consider radiofrequency rhizolysis or intraspinal procedures pending response to present treatment and F/U evaluation.  Patient to call [facility] Management Center should patient have concerns prior to scheduled return appointment.

## 2015-12-09 NOTE — Progress Notes (Signed)
Safety precautions to be maintained throughout the outpatient stay will include: orient to surroundings, keep bed in low position, maintain call bell within reach at all times, provide assistance with transfer out of bed and ambulation.  

## 2015-12-29 ENCOUNTER — Encounter: Payer: Self-pay | Admitting: Hematology and Oncology

## 2016-01-25 ENCOUNTER — Telehealth: Payer: Self-pay

## 2016-01-25 NOTE — Telephone Encounter (Signed)
Pt came by clinic today regarding a painful raised and  area on left hand less that the size of a dime wide.  Pt reports PCP sent referral but has not heard anything yet and would like to know if we could treat it.  I explained to pt that per Dr. Mike Gip she would need to see dermatology, PCP or urgent care if needed.  I also explained to pt she can call the office she had been referred to to get an earlier appt or see when next available is.  Pt verbalized an understanding and no other concerns noted.

## 2016-02-21 ENCOUNTER — Telehealth: Payer: Self-pay | Admitting: *Deleted

## 2016-02-21 ENCOUNTER — Telehealth: Payer: Self-pay | Admitting: Pain Medicine

## 2016-02-21 NOTE — Telephone Encounter (Signed)
Voicemail left with patient to please call with additional information re; pain.

## 2016-02-21 NOTE — Telephone Encounter (Signed)
Having increased pain in left hand fingers, wanted to find out who Dr. Primus Bravo recommended for her to have check this out.

## 2016-02-21 NOTE — Telephone Encounter (Signed)
Voicemail left for patient to please call us with additional information re; her pain in her hand and fingers.

## 2016-02-22 ENCOUNTER — Telehealth: Payer: Self-pay | Admitting: *Deleted

## 2016-02-23 NOTE — Telephone Encounter (Signed)
Spoke with Marilyn Romero regarding "trigger finger" pain in the left hand middle finger; she wanted to get an orthopedic recommendation. Recommendation given.

## 2016-03-08 ENCOUNTER — Ambulatory Visit: Payer: Medicare Other | Admitting: Pain Medicine

## 2016-03-12 ENCOUNTER — Other Ambulatory Visit: Payer: Self-pay | Admitting: Pain Medicine

## 2016-03-12 ENCOUNTER — Telehealth: Payer: Self-pay | Admitting: Pain Medicine

## 2016-03-12 NOTE — Telephone Encounter (Signed)
Nurses Juliann Pulse and Angie Please call patient and describe patient's pain to me so that we can decide which procedure is appropriate for patient Please schedule patient for Monday, 03/19/2016 if insurance will approve procedure  Thank you

## 2016-03-12 NOTE — Telephone Encounter (Signed)
Dr. Crisp, please advise. 

## 2016-03-12 NOTE — Telephone Encounter (Signed)
[facility] Nurses and Secretaries, Please schedule patient for    Block Of Nerves To Sacroiliac Joint for                                                 Monday, 03/19/2016 If insurance will allow  Thank you

## 2016-03-12 NOTE — Telephone Encounter (Signed)
Pain is in lower back and radiates into Right hip and  down the right leg.  This is the same pain that Dr Primus Bravo  has treated her for in the past. Told patient that I would convey this information and we would let her know if approval and scheduling.

## 2016-03-12 NOTE — Telephone Encounter (Signed)
Called patient to get better description of pain.  Left voicemail to please call us back with that information and then we can proceed with getting PA and scheduled.

## 2016-03-12 NOTE — Telephone Encounter (Signed)
Patient would like to have procedure next Monday when she comes in for her appt. 03-19-16 is this ok and if so we will need order put in please

## 2016-03-14 NOTE — Telephone Encounter (Signed)
No prior auth reqd - okay to schedule patient

## 2016-03-19 ENCOUNTER — Ambulatory Visit: Payer: Medicare Other | Admitting: Pain Medicine

## 2016-03-19 ENCOUNTER — Ambulatory Visit: Payer: Medicare Other | Attending: Pain Medicine | Admitting: Pain Medicine

## 2016-03-19 ENCOUNTER — Encounter: Payer: Self-pay | Admitting: Pain Medicine

## 2016-03-19 VITALS — BP 145/65 | HR 67 | Temp 97.7°F | Resp 16 | Ht 65.0 in | Wt 150.0 lb

## 2016-03-19 DIAGNOSIS — M47816 Spondylosis without myelopathy or radiculopathy, lumbar region: Secondary | ICD-10-CM

## 2016-03-19 DIAGNOSIS — M5136 Other intervertebral disc degeneration, lumbar region: Secondary | ICD-10-CM

## 2016-03-19 DIAGNOSIS — Z96649 Presence of unspecified artificial hip joint: Secondary | ICD-10-CM | POA: Diagnosis not present

## 2016-03-19 DIAGNOSIS — M5416 Radiculopathy, lumbar region: Secondary | ICD-10-CM

## 2016-03-19 DIAGNOSIS — M533 Sacrococcygeal disorders, not elsewhere classified: Secondary | ICD-10-CM

## 2016-03-19 DIAGNOSIS — M160 Bilateral primary osteoarthritis of hip: Secondary | ICD-10-CM | POA: Insufficient documentation

## 2016-03-19 DIAGNOSIS — M7060 Trochanteric bursitis, unspecified hip: Secondary | ICD-10-CM

## 2016-03-19 DIAGNOSIS — M51369 Other intervertebral disc degeneration, lumbar region without mention of lumbar back pain or lower extremity pain: Secondary | ICD-10-CM

## 2016-03-19 MED ORDER — MIDAZOLAM HCL 5 MG/5ML IJ SOLN: 5.0000 mg | Freq: Once | INTRAMUSCULAR | Status: DC

## 2016-03-19 MED ORDER — TRIAMCINOLONE ACETONIDE 40 MG/ML IJ SUSP: 40.0000 mg | Freq: Once | INTRAMUSCULAR | Status: DC

## 2016-03-19 MED ORDER — TRIAMCINOLONE ACETONIDE 40 MG/ML IJ SUSP
INTRAMUSCULAR | Status: AC
  Filled 2016-03-19: qty 1

## 2016-03-19 MED ORDER — SODIUM CHLORIDE 0.9 % IJ SOLN
INTRAMUSCULAR | Status: AC
  Filled 2016-03-19: qty 20

## 2016-03-19 MED ORDER — CEFAZOLIN IN D5W 1 GM/50ML IV SOLN: 1.0000 g | Freq: Once | INTRAVENOUS | Status: DC

## 2016-03-19 MED ORDER — LACTATED RINGERS IV SOLN: 1000.0000 mL | INTRAVENOUS | Status: DC

## 2016-03-19 MED ORDER — FENTANYL CITRATE (PF) 100 MCG/2ML IJ SOLN: 100.0000 ug | Freq: Once | INTRAMUSCULAR | Status: DC

## 2016-03-19 MED ORDER — SODIUM CHLORIDE 0.9% FLUSH: 20.0000 mL | Freq: Once | INTRAVENOUS | Status: DC

## 2016-03-19 MED ORDER — BUPIVACAINE HCL (PF) 0.25 % IJ SOLN: 30.0000 mL | Freq: Once | INTRAMUSCULAR | Status: DC

## 2016-03-19 MED ORDER — CEFUROXIME AXETIL 250 MG PO TABS: 250.0000 mg | ORAL_TABLET | Freq: Two times a day (BID) | ORAL | 0 refills | Status: DC

## 2016-03-19 MED ORDER — ORPHENADRINE CITRATE 30 MG/ML IJ SOLN: 60.0000 mg | Freq: Once | INTRAMUSCULAR | Status: DC

## 2016-03-19 NOTE — Progress Notes (Signed)
                                                    RIGHT GREATER TROCHANTERIC BURSA  INJECTION     The patient is a 82  -year-old female who returns to pain management for further evaluation and treatment of pain involving the greater trochanteric region. The patient is with  severely disabling pain reproduced  by palpation of  the greater  trochanteric region.  Prior studies reveal degenerative joint disease hips Status post total hip replacement . The patient is with reproduction of severely disabling pain with palpation of the greater trochanteric region on the right especially. There is concern regarding the patient's pain being due to a significant component of greater trochanteric bursitis and iliotibial band syndrome. The risk, benefits, and expectations of the procedure were discussed with and explained to the patient who was with understanding and in agreement with the suggested treatment plan.       Description Of Procedure:  Right Greater Trochanteric Bursa Injection  The patient was positioned in the lateral decubitus position. EKG, blood pressure, pulse, and pulse oximetry monitors were all in place. Identification of landmarks for needle entry for the procedure was accomplished and Betadine prep of the proposed needle entry site was accomplished.  A 22-gauge needle was inserted and 5 cc of 0.25% bupivacaine with Kenalog was injected. The needle was removed. The needle was reinserted in the region of the greater trochanter and in additional 5 cc of preservative-free normal saline with Kenalog was injected for right greater trochanteric bursa injection. The needle was removed and reinserted in the greater trochanteric region on the right and an additional 5 cc of preservative-free normal saline with Kenalog was injected for right greater trochanteric bursa injection.   The patient tolerated the procedure well  A total of 40mg  of Kenalog was utilized for the  procedure     PLAN  Continue present medication  F/U PCP  Dr. Gayland Curry for evaliation of  BP and general medical  condition  F/U surgical evaluation. May consider pending follow-up evaluations. Patient without desire to consider surgical evaluation at this time  F/U neurological evaluation. May consider pending follow-up evaluations  May consider radiofrequency rhizolysis or intraspinal procedures pending response to present treatment and F/U evaluation   Patient to call [facility] Management Center should patient have concerns prior to scheduled return appointment.

## 2016-03-19 NOTE — Patient Instructions (Addendum)
PLAN    Continue present medications.. Please obtain Ceftin antibiotic today and begin taking Ceftin antibiotic today as prescribed  F/U PCP  Dr. Gayland Curry for evaliation of  BP and general medical  condition.  F/U surgical evaluation. May consider pending further evaluation  F/U neurological evaluation. May consider [facility] EMG studies and other studies  May consider radiofrequency rhizolysis or intraspinal procedures pending response to present treatment and F/U evaluation.  Patient to call [facility] should patient have concerns prior to scheduled return appointment.Hip Bursitis Bursitis is a swelling and soreness (inflammation) of a fluid-filled sac (bursa). This sac overlies and protects the joints.  CAUSES   Injury.  Overuse of the muscles surrounding the joint.  Arthritis.  Gout.  Infection.  Cold weather.  Inadequate warm-up and conditioning prior to activities. The cause may not be known.  SYMPTOMS   Mild to severe irritation.  Tenderness and swelling over the outside of the hip.  Pain with motion of the hip.  If the bursa becomes infected, a fever may be present. Redness, tenderness, and warmth will develop over the hip. Symptoms usually lessen in 3 to 4 weeks with treatment, but can come back. TREATMENT If conservative treatment does not work, your caregiver may advise draining the bursa and injecting cortisone into the area. This may speed up the healing process. This may also be used as an initial treatment of choice. HOME CARE INSTRUCTIONS   Apply ice to the affected area for 15-20 minutes every 3 to 4 hours while awake for the first 2 days. Put the ice in a plastic bag and place a towel between the bag of ice and your skin.  Rest the painful joint as much as possible, but continue to put the joint through a normal range of motion at least 4 times per day. When the pain lessens, begin normal, slow movements and usual activities to help  prevent stiffness of the hip.  Only take over-the-counter or prescription medicines for pain, discomfort, or fever as directed by your caregiver.  Use crutches to limit weight bearing on the hip joint, if advised.  Elevate your painful hip to reduce swelling. Use pillows for propping and cushioning your legs and hips.  Gentle massage may provide comfort and decrease swelling. SEEK IMMEDIATE MEDICAL CARE IF:   Your pain increases even during treatment, or you are not improving.  You have a fever.  You have heat and inflammation over the involved bursa.  You have any other questions or concerns. MAKE SURE YOU:   Understand these instructions.  Will watch your condition.  Will get help right away if you are not doing well or get worse.   This information is not intended to replace advice given to you by your health care provider. Make sure you discuss any questions you have with your health care provider.   Document Released: 01/05/2002 Document Revised: 10/08/2011 Document Reviewed: 02/15/2015 Elsevier Interactive Patient Education 2016 [employer]. Pain Management Discharge Instructions  General Discharge Instructions :  If you need to reach your doctor call: Monday-Friday 8:00 am - 4:00 pm at [phone] or toll free [phone].  After clinic hours [phone] to have operator reach doctor.  Bring all of your medication bottles to all your appointments in the pain clinic.  To cancel or reschedule your appointment with Pain Management please remember to call 24 hours in advance to avoid a fee.  Refer to the educational materials which you have been given on: General Risks,  I had my Procedure. Discharge Instructions, Post Sedation.  Post Procedure Instructions:  The drugs you were given will stay in your system until tomorrow, so for the next 24 hours you should not drive, make any legal decisions or drink any alcoholic beverages.  You may eat anything you prefer,  but it is better to start with liquids then soups and crackers, and gradually work up to solid foods.  Please notify your doctor immediately if you have any unusual bleeding, trouble breathing or pain that is not related to your normal pain.  Depending on the type of procedure that was done, some parts of your body may feel week and/or numb.  This usually clears up by tonight or the next day.  Walk with the use of an assistive device or accompanied by an adult for the 24 hours.  You may use ice on the affected area for the first 24 hours.  Put ice in a Ziploc bag and cover with a towel and place against area 15 minutes on 15 minutes off.  You may switch to heat after 24 hours.

## 2016-03-19 NOTE — Progress Notes (Signed)
Patient here today for in room procedure for right hip pain.   Safety precautions to be maintained throughout the outpatient stay will include: orient to surroundings, keep bed in low position, maintain call bell within reach at all times, provide assistance with transfer out of bed and ambulation.

## 2016-03-20 ENCOUNTER — Telehealth: Payer: Self-pay | Admitting: *Deleted

## 2016-03-20 NOTE — Telephone Encounter (Signed)
Spoke with patient re; procedure, verbalizes no questions or concerns.  

## 2016-04-03 ENCOUNTER — Telehealth: Payer: Self-pay | Admitting: *Deleted

## 2016-04-17 ENCOUNTER — Ambulatory Visit: Payer: Medicare Other | Admitting: Pain Medicine

## 2016-06-06 ENCOUNTER — Inpatient Hospital Stay: Payer: Medicare Other | Attending: Hematology and Oncology

## 2016-06-06 ENCOUNTER — Inpatient Hospital Stay (HOSPITAL_BASED_OUTPATIENT_CLINIC_OR_DEPARTMENT_OTHER): Payer: Medicare Other | Admitting: Hematology and Oncology

## 2016-06-06 ENCOUNTER — Encounter: Payer: Self-pay | Admitting: Hematology and Oncology

## 2016-06-06 VITALS — BP 142/81 | HR 74 | Temp 98.1°F | Resp 18 | Wt 147.7 lb

## 2016-06-06 DIAGNOSIS — R1013 Epigastric pain: Secondary | ICD-10-CM | POA: Insufficient documentation

## 2016-06-06 DIAGNOSIS — I2510 Atherosclerotic heart disease of native coronary artery without angina pectoris: Secondary | ICD-10-CM | POA: Diagnosis not present

## 2016-06-06 DIAGNOSIS — R110 Nausea: Secondary | ICD-10-CM | POA: Insufficient documentation

## 2016-06-06 DIAGNOSIS — K219 Gastro-esophageal reflux disease without esophagitis: Secondary | ICD-10-CM | POA: Diagnosis not present

## 2016-06-06 DIAGNOSIS — Z9049 Acquired absence of other specified parts of digestive tract: Secondary | ICD-10-CM | POA: Insufficient documentation

## 2016-06-06 DIAGNOSIS — C187 Malignant neoplasm of sigmoid colon: Secondary | ICD-10-CM

## 2016-06-06 DIAGNOSIS — M25559 Pain in unspecified hip: Secondary | ICD-10-CM | POA: Insufficient documentation

## 2016-06-06 DIAGNOSIS — G8929 Other chronic pain: Secondary | ICD-10-CM | POA: Diagnosis not present

## 2016-06-06 DIAGNOSIS — M5136 Other intervertebral disc degeneration, lumbar region: Secondary | ICD-10-CM | POA: Diagnosis not present

## 2016-06-06 DIAGNOSIS — Z79899 Other long term (current) drug therapy: Secondary | ICD-10-CM

## 2016-06-06 DIAGNOSIS — Z85038 Personal history of other malignant neoplasm of large intestine: Secondary | ICD-10-CM | POA: Diagnosis present

## 2016-06-06 LAB — COMPREHENSIVE METABOLIC PANEL
ALT: 14 U/L (ref 14–54)
AST: 19 U/L (ref 15–41)
Albumin: 3.8 g/dL (ref 3.5–5.0)
Alkaline Phosphatase: 57 U/L (ref 38–126)
Anion gap: 7 (ref 5–15)
BUN: 16 mg/dL (ref 6–20)
CO2: 24 mmol/L (ref 22–32)
Calcium: 9.2 mg/dL (ref 8.9–10.3)
Chloride: 109 mmol/L (ref 101–111)
Creatinine, Ser: 0.76 mg/dL (ref 0.44–1.00)
GFR calc Af Amer: >60 mL/min (ref >60)
GFR calc non Af Amer: >60 mL/min (ref >60)
Glucose, Bld: 129 mg/dL — ABNORMAL HIGH (ref 65–99)
Potassium: 3.6 mmol/L (ref 3.5–5.1)
Sodium: 140 mmol/L (ref 135–145)
Total Bilirubin: 0.4 mg/dL (ref 0.3–1.2)
Total Protein: 6.9 g/dL (ref 6.5–8.1)

## 2016-06-06 LAB — CBC WITH DIFFERENTIAL/PLATELET
Basophils Absolute: 0.1 10*3/uL (ref 0–0.1)
Basophils Relative: 1 %
Eosinophils Absolute: 0.2 10*3/uL (ref 0–0.7)
Eosinophils Relative: 3 %
HCT: 41 % (ref 35.0–47.0)
Hemoglobin: 13.3 g/dL (ref 12.0–16.0)
Lymphocytes Relative: 11 %
Lymphs Abs: 1.1 10*3/uL (ref 1.0–3.6)
MCH: 28.3 pg (ref 26.0–34.0)
MCHC: 32.5 g/dL (ref 32.0–36.0)
MCV: 87.2 fL (ref 80.0–100.0)
Monocytes Absolute: 1 10*3/uL — ABNORMAL HIGH (ref 0.2–0.9)
Monocytes Relative: 10 %
Neutro Abs: 7.7 10*3/uL — ABNORMAL HIGH (ref 1.4–6.5)
Neutrophils Relative %: 75 %
Platelets: 327 10*3/uL (ref 150–440)
RBC: 4.7 MIL/uL (ref 3.80–5.20)
RDW: 14.1 % (ref 11.5–14.5)
WBC: 10.1 10*3/uL (ref 3.6–11.0)

## 2016-06-06 NOTE — Progress Notes (Signed)
[facility] day:  06/06/16  Chief Complaint: Marilyn Romero is a 82 y.o. female with a history of stage II colon cancer who is seen for 6 month reassessment.  HPI: The patient was last seen in the medical oncology clinic on 12/07/2015.  At that time, she denied any abdominal symptoms.  She denied any melena or hematochezia.  CBC with diff, CMP, and CEA (2.8) were normal.  Symptomatically, she feels "ok".  She notes feeling nauseated a couple of times.  She has had some epigastric pain after eating.  She denies any relation to certain foods.   Past Medical History:  Diagnosis Date  . Cancer of sigmoid colon ([place]) 12/08/2014    Past Surgical History:  Procedure Laterality Date  . COLON RESECTION    . HIP SURGERY Right     Family History  Problem Relation Age of Onset  . Early death Mother   . Arthritis Father   . Hypertension Father     Social History:  reports that she has never smoked. She has never used smokeless tobacco. She reports that she does not drink alcohol or use drugs.  The patient is alone today.  Allergies: No Known Allergies  Current Medications: Current Outpatient Prescriptions  Medication Sig Dispense Refill  . citalopram (CELEXA) 10 MG tablet Take 10 mg by mouth daily.    Marland Kitchen ibuprofen (ADVIL,MOTRIN) 200 MG tablet Take 200 mg by mouth every 6 (six) hours as needed. Reported on 10/31/2015    . traMADol (ULTRAM) 50 MG tablet Take 50 mg by mouth every 4 (four) hours as needed.     Current Facility-Administered Medications  Medication Dose Route Frequency Provider Last Rate Last Dose  . bupivacaine (PF) (MARCAINE) 0.25 % injection 30 mL  30 mL Other Once Mohammed Kindle, MD      . bupivacaine (PF) (MARCAINE) 0.25 % injection 30 mL  30 mL Other Once Mohammed Kindle, MD      . ceFAZolin (ANCEF) IVPB 1 g/50 mL premix  1 g Intravenous Once Mohammed Kindle, MD      . fentaNYL (SUBLIMAZE) injection 100 mcg  100 mcg Intravenous Once  Mohammed Kindle, MD      . lactated ringers infusion 1,000 mL  1,000 mL Intravenous Continuous Mohammed Kindle, MD      . midazolam (VERSED) 5 MG/5ML injection 5 mg  5 mg Intravenous Once Mohammed Kindle, MD      . orphenadrine (NORFLEX) injection 60 mg  60 mg Intramuscular Once Mohammed Kindle, MD      . sodium chloride flush (NS) 0.9 % injection 20 mL  20 mL Other Once Mohammed Kindle, MD      . triamcinolone acetonide (KENALOG-40) injection 40 mg  40 mg Other Once Mohammed Kindle, MD      . triamcinolone acetonide (KENALOG-40) injection 40 mg  40 mg Other Once Mohammed Kindle, MD      . triamcinolone acetonide (KENALOG-40) injection 40 mg  40 mg Other Once Mohammed Kindle, MD        Review of Systems:  GENERAL:  Feels "ok".  No fevers or sweats.  Weight up 1 pound. PERFORMANCE STATUS (ECOG):  1 HEENT:  No visual changes, runny nose, sore throat, mouth sores or tenderness. Lungs: No shortness of breath or cough.  No hemoptysis. Cardiac:  No chest pain, palpitations, orthopnea, or PND. GI:  Epigastric pain after eating.  Nauseated x 2.  No vomiting, diarrhea, constipation, melena or  hematochezia. GU:  No urgency, frequency, dysuria, or hematuria. Musculoskeletal:  Hip pain, "permanent".  No back pain.  No longer receives steroid injections.  No muscle tenderness. Extremities:  No pain or swelling. Skin:  No rashes or skin changes. Neuro:  No headache, numbness or weakness, balance or coordination issues. Endocrine:  No diabetes, thyroid issues, hot flashes or night sweats. Psych:  No mood changes, anxiety or depression. Pain:  No focal pain. Review of systems:  All other systems reviewed and found to be negative.  Physical Exam: Blood pressure (!) 142/81, pulse 74, temperature 98.1 F (36.7 C), temperature source Tympanic, resp. rate 18, weight 147 lb 11.3 oz (67 kg). GENERAL:  Well developed, well nourished, elderly woman sitting comfortably in the exam room in no acute distress.  Tearful at  times. MENTAL STATUS:  Alert and oriented to person, place and time. HEAD:  Wavy blonde hair.  Normocephalic, atraumatic, face symmetric, no Cushingoid features. EYES:  Brown eyes.  Pupils equal round and reactive to light and accomodation.  No conjunctivitis or scleral icterus. ENT:  Oropharynx clear without lesion.  Tongue normal. Mucous membranes dry.  RESPIRATORY:  Clear to auscultation without rales, wheezes or rhonchi. CARDIOVASCULAR:  Regular rate and rhythm without murmur, rub or gallop. ABDOMEN:  Slightly tender in the upper quadrants.  No guarding or rebound tenderness.  Soft with active bowel sounds, and no hepatosplenomegaly.  No masses. SKIN:  Fading bruise.  No rashes, ulcers or lesions. EXTREMITIES: Lower extremity chronic edema.  No skin discoloration or tenderness.  No palpable cords. LYMPH NODES: No palpable cervical, supraclavicular, axillary or inguinal adenopathy  NEUROLOGICAL: Unremarkable. PSYCH:  Appropriate.  Appointment on 06/06/2016  Component Date Value Ref Range Status  . WBC 06/06/2016 10.1  3.6 - 11.0 K/uL Final  . RBC 06/06/2016 4.70  3.80 - 5.20 MIL/uL Final  . Hemoglobin 06/06/2016 13.3  12.0 - 16.0 g/dL Final  . HCT 06/06/2016 41.0  35.0 - 47.0 % Final  . MCV 06/06/2016 87.2  80.0 - 100.0 fL Final  . MCH 06/06/2016 28.3  26.0 - 34.0 pg Final  . MCHC 06/06/2016 32.5  32.0 - 36.0 g/dL Final  . RDW 06/06/2016 14.1  11.5 - 14.5 % Final  . Platelets 06/06/2016 327  150 - 440 K/uL Final  . Neutrophils Relative % 06/06/2016 75  % Final  . Neutro Abs 06/06/2016 7.7* 1.4 - 6.5 K/uL Final  . Lymphocytes Relative 06/06/2016 11  % Final  . Lymphs Abs 06/06/2016 1.1  1.0 - 3.6 K/uL Final  . Monocytes Relative 06/06/2016 10  % Final  . Monocytes Absolute 06/06/2016 1.0* 0.2 - 0.9 K/uL Final  . Eosinophils Relative 06/06/2016 3  % Final  . Eosinophils Absolute 06/06/2016 0.2  0 - 0.7 K/uL Final  . Basophils Relative 06/06/2016 1  % Final  . Basophils Absolute  06/06/2016 0.1  0 - 0.1 K/uL Final  . Sodium 06/06/2016 140  135 - 145 mmol/L Final  . Potassium 06/06/2016 3.6  3.5 - 5.1 mmol/L Final  . Chloride 06/06/2016 109  101 - 111 mmol/L Final  . CO2 06/06/2016 24  22 - 32 mmol/L Final  . Glucose, Bld 06/06/2016 129* 65 - 99 mg/dL Final  . BUN 06/06/2016 16  6 - 20 mg/dL Final  . Creatinine, Ser 06/06/2016 0.76  0.44 - 1.00 mg/dL Final  . Calcium 06/06/2016 9.2  8.9 - 10.3 mg/dL Final  . Total Protein 06/06/2016 6.9  6.5 - 8.1 g/dL Final  .  Albumin 06/06/2016 3.8  3.5 - 5.0 g/dL Final  . AST 06/06/2016 19  15 - 41 U/L Final  . ALT 06/06/2016 14  14 - 54 U/L Final  . Alkaline Phosphatase 06/06/2016 57  38 - 126 U/L Final  . Total Bilirubin 06/06/2016 0.4  0.3 - 1.2 mg/dL Final  . GFR calc non Af Amer 06/06/2016 >60  >60 mL/min Final  . GFR calc Af Amer 06/06/2016 >60  >60 mL/min Final   Comment: (NOTE) The eGFR has been calculated using the CKD EPI equation. This calculation has not been validated in all clinical situations. eGFR's persistently <60 mL/min signify possible Chronic Kidney Disease.   . Anion gap 06/06/2016 7  5 - 15 Final    Assessment:  Marilyn Romero is a 82 y.o. female with a history of stage II sigmoid colon cancer.  She underwent hemicolectomy in 10/01/2011.  Pathology revealed a 3.5 cm moderately differentiated adenocarcinoma that invaded through the muscularis propria into the pericolic tissue.  There was lymphovascular and perineural invasion. Ten nodes were negative.  Pathologic stage was T3N0M0.    CEA was 1.4 on 12/07/2011, 3.0 on 06/08/2015, and 2.8 on 12/08/2015.  She enrolled on NSABP P-5 which was a randomization between preventative daily rosuvastatin/Crestor versus placebo.  The study was eventualy closed secondary to poor accrual.  She notes a history of a benign lung tumor removal in 1974.  She denies any respiratory symptoms.  Symptomatically,.  She denies any abdominal symptoms.  She denies any melena or  hematochezia.  Plan: 1.  Labs today: CBC with diff, CMP, CEA. 2.  Discuss new abdominal symptoms.  Etiology unclear.  Possibly related to gastritis or gallbladder disease.  Discuss consideration of imaging given new symptoms (no imaging since diagnosis).  Reviewed orignal pathology.  Discussed  lymphovascular and perineural invasion.  Discussed last colonoscopy.  Patient unaware.  She believes her gastroenterologist is no longer in the area.  Will schedule follow-up colonoscopy. 3.  Chest, abdomen, and pelvic CT scan: restaging. 4.  Consult G:  follow-up colonoscopy after colon cancer. 5.  RTC after above.    Lequita Asal, MD  06/06/2016, 10:23 AM

## 2016-06-06 NOTE — Progress Notes (Signed)
Patient states she has started having issues with her BP being elevated.  Dr. Astrid Divine (PCP) has asked her to keep a diary of her BP readings for a month.  Today 142/81  HR 74.  Recheck 147/78 HR 77.

## 2016-06-07 ENCOUNTER — Ambulatory Visit
Admission: RE | Admit: 2016-06-07 | Discharge: 2016-06-07 | Disposition: A | Discharge status: Home / Self Care | Payer: Medicare Other | Source: Ambulatory Visit | Attending: Hematology and Oncology | Admitting: Hematology and Oncology

## 2016-06-07 DIAGNOSIS — C187 Malignant neoplasm of sigmoid colon: Secondary | ICD-10-CM | POA: Diagnosis not present

## 2016-06-07 DIAGNOSIS — K5730 Diverticulosis of large intestine without perforation or abscess without bleeding: Secondary | ICD-10-CM | POA: Diagnosis not present

## 2016-06-07 DIAGNOSIS — N9489 Other specified conditions associated with female genital organs and menstrual cycle: Secondary | ICD-10-CM | POA: Insufficient documentation

## 2016-06-07 LAB — CEA: CEA: 2.9 ng/mL (ref 0.0–4.7)

## 2016-06-07 MED ORDER — IOPAMIDOL (ISOVUE-300) INJECTION 61%
100.0000 mL | Freq: Once | INTRAVENOUS | Status: AC | PRN
  Administered 2016-06-07: 100 mL via INTRAVENOUS

## 2016-06-18 ENCOUNTER — Other Ambulatory Visit: Payer: Self-pay

## 2016-06-18 ENCOUNTER — Ambulatory Visit (INDEPENDENT_AMBULATORY_CARE_PROVIDER_SITE_OTHER): Payer: Medicare Other | Admitting: Gastroenterology

## 2016-06-18 VITALS — BP 151/71 | HR 72 | Temp 98.1°F | Ht 65.0 in | Wt 150.5 lb

## 2016-06-18 DIAGNOSIS — G4700 Insomnia, unspecified: Secondary | ICD-10-CM | POA: Insufficient documentation

## 2016-06-18 DIAGNOSIS — R1013 Epigastric pain: Secondary | ICD-10-CM

## 2016-06-18 DIAGNOSIS — G8929 Other chronic pain: Secondary | ICD-10-CM | POA: Diagnosis not present

## 2016-06-18 HISTORY — DX: Insomnia, unspecified: G47.00

## 2016-06-18 NOTE — Progress Notes (Signed)
Primary Care Physician: Gayland Curry, MD  Primary Gastroenterologist:  Dr. Lucilla Lame  Chief Complaint  Patient presents with  . History of colon cancer    HPI: Marilyn Romero is a 82 y.o. female here with a history of colon cancer. The patient states that her most recent CT scan did not show any recurrence of her colon cancer. The patient does report that she hasn't had a colonoscopy since her diagnosis. The patient's follow-up with oncology has been uneventful and the patient has been doing well. The patient does report a new symptom of epigastric pain she reports to be worse when she eats. There is no report of any unexplained weight loss. The patient is now here because of the epigastric pain and history of colon cancer.  Current Outpatient Prescriptions  Medication Sig Dispense Refill  . citalopram (CELEXA) 10 MG tablet Take 10 mg by mouth daily.    Marland Kitchen ibuprofen (ADVIL,MOTRIN) 200 MG tablet Take 200 mg by mouth every 6 (six) hours as needed. Reported on 10/31/2015    . traMADol (ULTRAM) 50 MG tablet Take 50 mg by mouth every 4 (four) hours as needed.     Current Facility-Administered Medications  Medication Dose Route Frequency Provider Last Rate Last Dose  . bupivacaine (PF) (MARCAINE) 0.25 % injection 30 mL  30 mL Other Once Mohammed Kindle, MD      . bupivacaine (PF) (MARCAINE) 0.25 % injection 30 mL  30 mL Other Once Mohammed Kindle, MD      . ceFAZolin (ANCEF) IVPB 1 g/50 mL premix  1 g Intravenous Once Mohammed Kindle, MD      . fentaNYL (SUBLIMAZE) injection 100 mcg  100 mcg Intravenous Once Mohammed Kindle, MD      . lactated ringers infusion 1,000 mL  1,000 mL Intravenous Continuous Mohammed Kindle, MD      . midazolam (VERSED) 5 MG/5ML injection 5 mg  5 mg Intravenous Once Mohammed Kindle, MD      . orphenadrine (NORFLEX) injection 60 mg  60 mg Intramuscular Once Mohammed Kindle, MD      . sodium chloride flush (NS) 0.9 % injection 20 mL  20 mL Other Once Mohammed Kindle, MD      .  triamcinolone acetonide (KENALOG-40) injection 40 mg  40 mg Other Once Mohammed Kindle, MD      . triamcinolone acetonide [facility]) injection 40 mg  40 mg Other Once Mohammed Kindle, MD      . triamcinolone acetonide [facility]) injection 40 mg  40 mg Other Once Mohammed Kindle, MD        Allergies as of 06/18/2016  . (No Known Allergies)    ROS:  General: Negative for anorexia, weight loss, fever, chills, fatigue, weakness. ENT: Negative for hoarseness, difficulty swallowing , nasal congestion. CV: Negative for chest pain, angina, palpitations, dyspnea on exertion, peripheral edema.  Respiratory: Negative for dyspnea at rest, dyspnea on exertion, cough, sputum, wheezing.  GI: See history of present illness. GU:  Negative for dysuria, hematuria, urinary incontinence, urinary frequency, nocturnal urination.  Endo: Negative for unusual weight change.    Physical Examination:   BP (!) 151/71   Pulse 72   Temp 98.1 F (36.7 C) (Oral)   Ht 5\' 5"  (1.651 m)   Wt 150 lb 8 oz (68.3 kg)   BMI 25.04 kg/m   General: Well-nourished, well-developed in no acute distress.  Eyes: No icterus. Conjunctivae pink. Mouth: Oropharyngeal mucosa moist and pink , no lesions erythema or exudate. Lungs:  Clear to auscultation bilaterally. Non-labored. Heart: Regular rate and rhythm, no murmurs rubs or gallops.  Abdomen: Bowel sounds are normal, nontender, nondistended, no hepatosplenomegaly or masses, no abdominal bruits or hernia , no rebound or guarding.   Extremities: No lower extremity edema. No clubbing or deformities. Neuro: Alert and oriented x 3.  Grossly intact. Skin: Warm and dry, no jaundice.   Psych: Alert and cooperative, normal mood and affect.  Labs:    Imaging Studies: Ct Chest W Contrast  Result Date: 06/07/2016 CLINICAL DATA:  Stage II sigmoid colon cancer status post hemicolectomy 10/01/2011. Patient presents for restaging and reports chronic generalized abdominal pain. EXAM: CT  CHEST, ABDOMEN, AND PELVIS WITH CONTRAST TECHNIQUE: Multidetector CT imaging of the chest, abdomen and pelvis was performed following the standard protocol during bolus administration of intravenous contrast. CONTRAST:  133mL ISOVUE-300 IOPAMIDOL (ISOVUE-300) INJECTION 61% COMPARISON:  05/23/2015 CT abdomen/pelvis.  05/17/2010 chest CT. FINDINGS: CT CHEST FINDINGS Motion degraded scan. Cardiovascular: Top-normal heart size. No significant pericardial fluid/thickening. Left anterior descending, left circumflex and right coronary atherosclerosis. Atherosclerotic nonaneurysmal thoracic aorta. Top-normal caliber pulmonary arteries. No central pulmonary emboli. Mediastinum/Nodes: Hypodense bilateral thyroid lobe nodules, largest 0.5 cm on the right, not definitely seen on 05/17/2010. Trace oral contrast remains in the mid to lower esophagus. No pathologically enlarged axillary, mediastinal or hilar lymph nodes. Lungs/Pleura: No pneumothorax. No pleural effusion. There are 2 solid pulmonary nodules in the medial segment right middle lobe, largest 4 mm (series 6/ image 76), stable since 05/17/2010, considered benign. No acute consolidative airspace disease, lung masses or new significant pulmonary nodules. Stable mild scarring in the dependent left lower lobe adjacent to the rib deformity. Musculoskeletal: No aggressive appearing focal osseous lesions. Stable healed deformity in the left posterior sixth rib, possibly from prior thoracotomy. Soft tissue anchor is partially visualized in the left humeral head. Mild thoracic spondylosis. CT ABDOMEN PELVIS FINDINGS Hepatobiliary: Normal liver with no liver mass. Gallbladder is surgically absent. Bile ducts are stable and within expected post cholecystectomy limits, with mild diffuse intrahepatic biliary ductal dilatation and common bile duct diameter 7 mm. No radiopaque choledocholithiasis. Pancreas: Normal, with no mass or duct dilation. Spleen: Normal size. No mass.  Adrenals/Urinary Tract: Normal adrenals. Simple 2.2 cm renal cortical cyst in the medial upper right kidney. Subcentimeter hypodense renal cortical lesions in both kidneys are too small to characterize and require no further follow-up. No hydronephrosis. Partial duplication of the left renal collecting system to the level of the proximal left ureter. Limited visualization of the bladder due to streak artifact from right hip hardware, with no gross bladder abnormality. Stomach/Bowel: Grossly normal stomach. Normal caliber small bowel with no small bowel wall thickening. Appendix is not discretely visualized. No pericecal inflammatory changes. Stable appearance status post subtotal distal colectomy with intact appearing distal colonic anastomosis. No anastomotic wall thickening or discrete mass. Mild diverticulosis of the descending and remnant sigmoid colon, with no large bowel wall thickening or pericolonic fat stranding. Oral contrast progresses to the splenic flexure of the colon. Moderate stool throughout the colon. Vascular/Lymphatic: Atherosclerotic nonaneurysmal abdominal aorta. Patent portal, splenic, hepatic and renal veins. No pathologically enlarged lymph nodes in the abdomen or pelvis. Reproductive: Apparent hysterectomy. Vaginal cuff is poorly visualized due to streak artifact. Simple appearing 3.9 cm left adnexal cyst is stable since 05/23/2015. No right adnexal mass. Other: No pneumoperitoneum, ascites or focal fluid collection. Musculoskeletal: No aggressive appearing focal osseous lesions. Moderate lumbar spondylosis. Partially visualized right total hip arthroplasty. IMPRESSION: 1. No  evidence of metastatic disease in the chest, abdomen or pelvis. 2. No evidence of local tumor recurrence status post subtotal distal colectomy. 3. No acute abnormality. No evidence of bowel obstruction or acute bowel inflammation. Mild distal colonic diverticulosis, with no evidence acute diverticulitis. 4. Moderate  colonic stool could indicate constipation. 5. Simple 3.9 cm left adnexal cyst, for which 12 month stability has been demonstrated, consistent with a benign etiology. 6. Additional findings include aortic atherosclerosis and three-vessel coronary atherosclerosis. Electronically Signed   By: Ilona Sorrel M.D.   On: 06/07/2016 12:24   Ct Abdomen Pelvis W Contrast  Result Date: 06/07/2016 CLINICAL DATA:  Stage II sigmoid colon cancer status post hemicolectomy 10/01/2011. Patient presents for restaging and reports chronic generalized abdominal pain. EXAM: CT CHEST, ABDOMEN, AND PELVIS WITH CONTRAST TECHNIQUE: Multidetector CT imaging of the chest, abdomen and pelvis was performed following the standard protocol during bolus administration of intravenous contrast. CONTRAST:  146mL ISOVUE-300 IOPAMIDOL (ISOVUE-300) INJECTION 61% COMPARISON:  05/23/2015 CT abdomen/pelvis.  05/17/2010 chest CT. FINDINGS: CT CHEST FINDINGS Motion degraded scan. Cardiovascular: Top-normal heart size. No significant pericardial fluid/thickening. Left anterior descending, left circumflex and right coronary atherosclerosis. Atherosclerotic nonaneurysmal thoracic aorta. Top-normal caliber pulmonary arteries. No central pulmonary emboli. Mediastinum/Nodes: Hypodense bilateral thyroid lobe nodules, largest 0.5 cm on the right, not definitely seen on 05/17/2010. Trace oral contrast remains in the mid to lower esophagus. No pathologically enlarged axillary, mediastinal or hilar lymph nodes. Lungs/Pleura: No pneumothorax. No pleural effusion. There are 2 solid pulmonary nodules in the medial segment right middle lobe, largest 4 mm (series 6/ image 76), stable since 05/17/2010, considered benign. No acute consolidative airspace disease, lung masses or new significant pulmonary nodules. Stable mild scarring in the dependent left lower lobe adjacent to the rib deformity. Musculoskeletal: No aggressive appearing focal osseous lesions. Stable healed  deformity in the left posterior sixth rib, possibly from prior thoracotomy. Soft tissue anchor is partially visualized in the left humeral head. Mild thoracic spondylosis. CT ABDOMEN PELVIS FINDINGS Hepatobiliary: Normal liver with no liver mass. Gallbladder is surgically absent. Bile ducts are stable and within expected post cholecystectomy limits, with mild diffuse intrahepatic biliary ductal dilatation and common bile duct diameter 7 mm. No radiopaque choledocholithiasis. Pancreas: Normal, with no mass or duct dilation. Spleen: Normal size. No mass. Adrenals/Urinary Tract: Normal adrenals. Simple 2.2 cm renal cortical cyst in the medial upper right kidney. Subcentimeter hypodense renal cortical lesions in both kidneys are too small to characterize and require no further follow-up. No hydronephrosis. Partial duplication of the left renal collecting system to the level of the proximal left ureter. Limited visualization of the bladder due to streak artifact from right hip hardware, with no gross bladder abnormality. Stomach/Bowel: Grossly normal stomach. Normal caliber small bowel with no small bowel wall thickening. Appendix is not discretely visualized. No pericecal inflammatory changes. Stable appearance status post subtotal distal colectomy with intact appearing distal colonic anastomosis. No anastomotic wall thickening or discrete mass. Mild diverticulosis of the descending and remnant sigmoid colon, with no large bowel wall thickening or pericolonic fat stranding. Oral contrast progresses to the splenic flexure of the colon. Moderate stool throughout the colon. Vascular/Lymphatic: Atherosclerotic nonaneurysmal abdominal aorta. Patent portal, splenic, hepatic and renal veins. No pathologically enlarged lymph nodes in the abdomen or pelvis. Reproductive: Apparent hysterectomy. Vaginal cuff is poorly visualized due to streak artifact. Simple appearing 3.9 cm left adnexal cyst is stable since 05/23/2015. No right  adnexal mass. Other: No pneumoperitoneum, ascites or focal fluid collection.  Musculoskeletal: No aggressive appearing focal osseous lesions. Moderate lumbar spondylosis. Partially visualized right total hip arthroplasty. IMPRESSION: 1. No evidence of metastatic disease in the chest, abdomen or pelvis. 2. No evidence of local tumor recurrence status post subtotal distal colectomy. 3. No acute abnormality. No evidence of bowel obstruction or acute bowel inflammation. Mild distal colonic diverticulosis, with no evidence acute diverticulitis. 4. Moderate colonic stool could indicate constipation. 5. Simple 3.9 cm left adnexal cyst, for which 12 month stability has been demonstrated, consistent with a benign etiology. 6. Additional findings include aortic atherosclerosis and three-vessel coronary atherosclerosis. Electronically Signed   By: Ilona Sorrel M.D.   On: 06/07/2016 12:24    Assessment and Plan:   Marilyn Romero is a 82 y.o. y/o female who has a history of colon cancer and now comes with epigastric pain that is postprandial. The patient will be set up for a EGD and colonoscopy. The patient has been explained the plan and agrees with it.    Lucilla Lame, MD. Marval Regal   Note: This dictation was prepared with Dragon dictation along with smaller phrase technology. Any transcriptional errors that result from this process are unintentional.

## 2016-06-27 ENCOUNTER — Inpatient Hospital Stay (HOSPITAL_BASED_OUTPATIENT_CLINIC_OR_DEPARTMENT_OTHER): Payer: Medicare Other | Admitting: Hematology and Oncology

## 2016-06-27 VITALS — BP 135/84 | HR 77 | Temp 98.9°F | Resp 18 | Wt 148.8 lb

## 2016-06-27 DIAGNOSIS — G8929 Other chronic pain: Secondary | ICD-10-CM

## 2016-06-27 DIAGNOSIS — R1013 Epigastric pain: Secondary | ICD-10-CM

## 2016-06-27 DIAGNOSIS — Z79899 Other long term (current) drug therapy: Secondary | ICD-10-CM

## 2016-06-27 DIAGNOSIS — Z85038 Personal history of other malignant neoplasm of large intestine: Secondary | ICD-10-CM

## 2016-06-27 DIAGNOSIS — Z9049 Acquired absence of other specified parts of digestive tract: Secondary | ICD-10-CM

## 2016-06-27 DIAGNOSIS — R110 Nausea: Secondary | ICD-10-CM

## 2016-06-27 DIAGNOSIS — K219 Gastro-esophageal reflux disease without esophagitis: Secondary | ICD-10-CM

## 2016-06-27 DIAGNOSIS — I2510 Atherosclerotic heart disease of native coronary artery without angina pectoris: Secondary | ICD-10-CM

## 2016-06-27 DIAGNOSIS — M25559 Pain in unspecified hip: Secondary | ICD-10-CM

## 2016-06-27 DIAGNOSIS — M5136 Other intervertebral disc degeneration, lumbar region: Secondary | ICD-10-CM

## 2016-06-27 DIAGNOSIS — C187 Malignant neoplasm of sigmoid colon: Secondary | ICD-10-CM

## 2016-06-27 IMAGING — MR MRI OF THE RIGHT HIP WITHOUT CONTRAST
5 of 6 series · 27 of 40 positions shown · non-contrast
Comparison: 02/27/2012

ADDENDUM:
The original report was by Dr. Suhani Crum. The following
addendum is by Dr. Suhani Crum:

Impression #2 should read, "NO definite abnormal fluid collection or
particulate disease around the right hip implant. Atrophy of much of
the right gluteus medius centrally."
CLINICAL DATA: Anterior and posterior pelvic pain, chronic since
7331. Right total hip prosthesis
EXAM:
MR OF THE RIGHT HIP WITHOUT CONTRAST
TECHNIQUE: Multiplanar, multisequence MR imaging was performed using metal
artifact reduction sequences. No intravenous contrast was
administered.

[Series 2: T1 · axial · 6.0mm · 0.62mm/px · z∈[-84,+103]mm · 6 of 25 slices shown (1 of 2)]
[im 1/25]
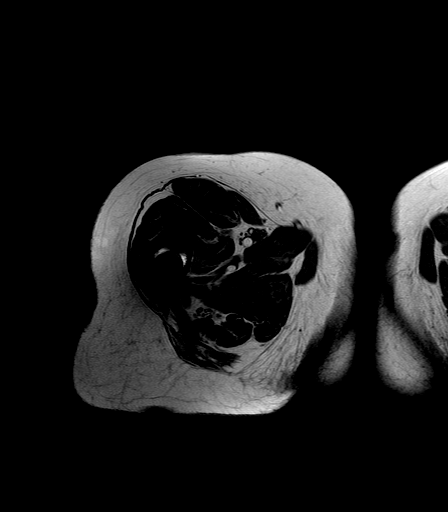
[im 5/25]
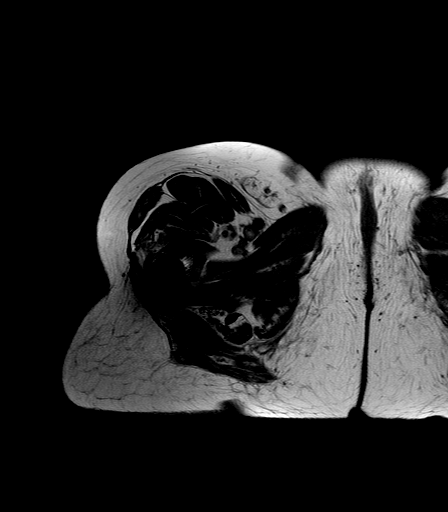
[im 10/25]
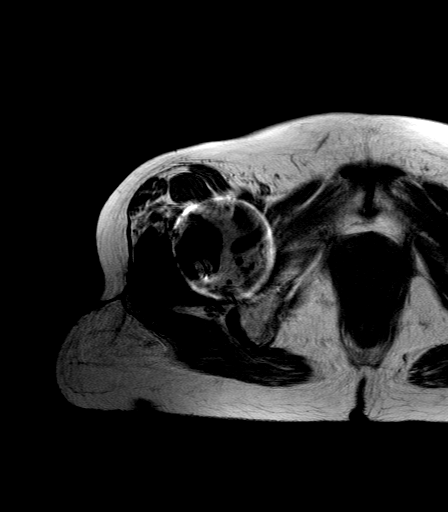
[im 15/25]
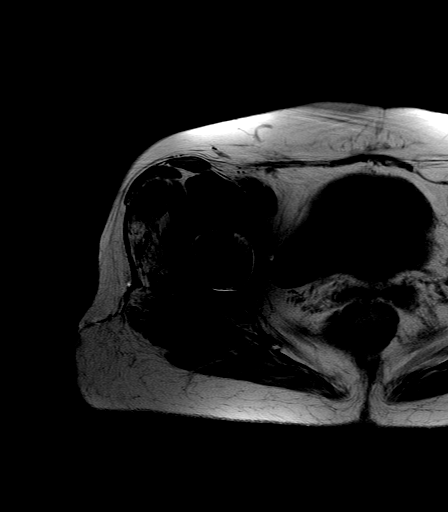
[im 20/25]
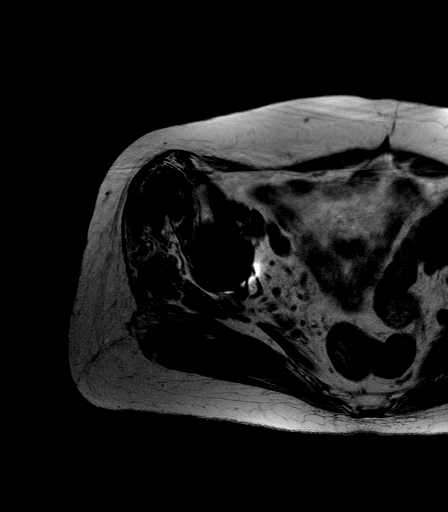
[im 25/25]
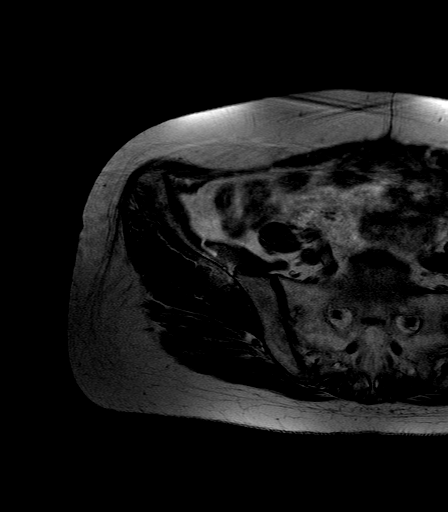

[Series 3: T2 fat-sat · axial · 6.0mm · 0.83mm/px · z∈[-84,+103]mm · 7 of 25 slices shown]
[im 1/25]
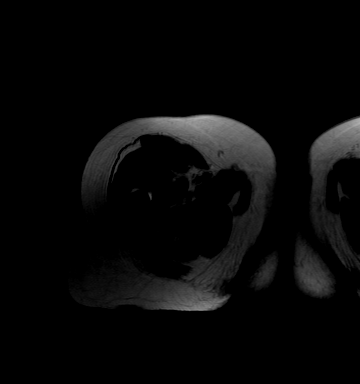
[im 5/25]
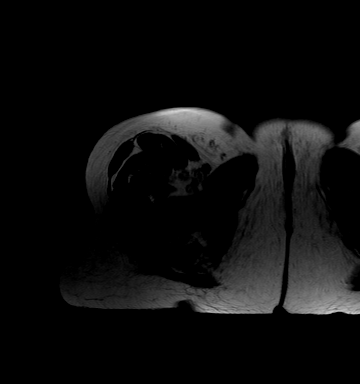
[im 9/25]
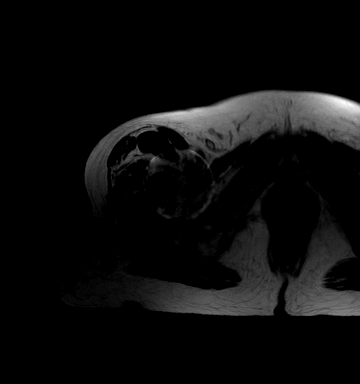
[im 13/25]
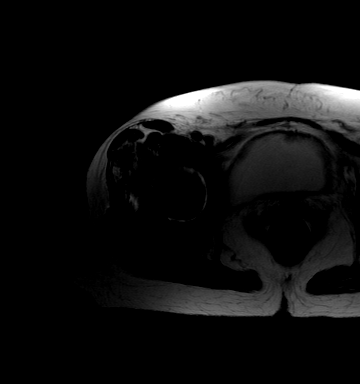
[im 17/25]
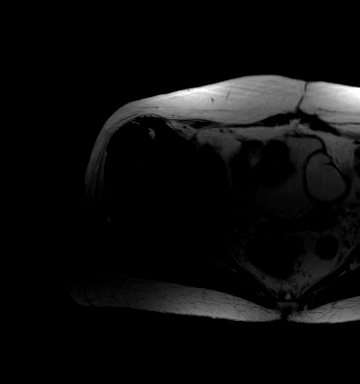
[im 21/25]
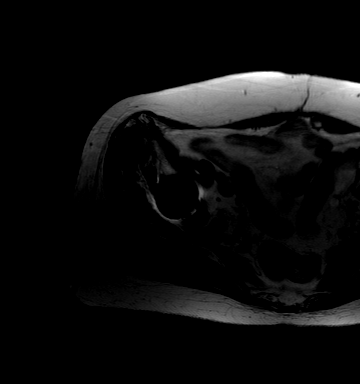
[im 25/25]
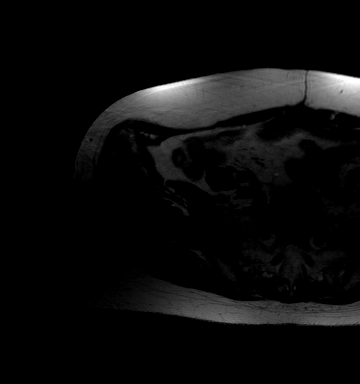

[Series 4: T1 · coronal · 5.0mm · 1.12mm/px · 6 of 22 slices shown (2 of 2)]
[im 1/22]
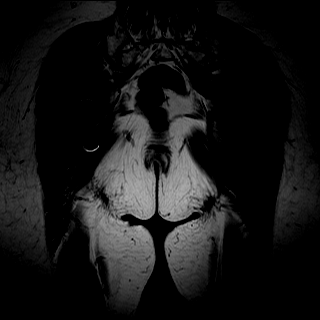
[im 5/22]
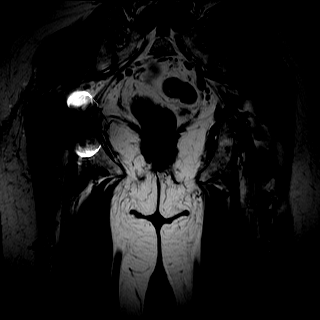
[im 9/22]
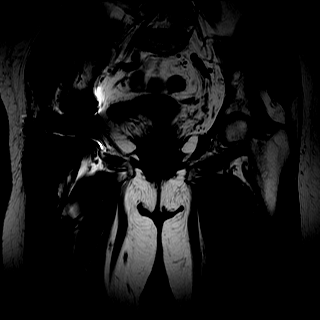
[im 13/22]
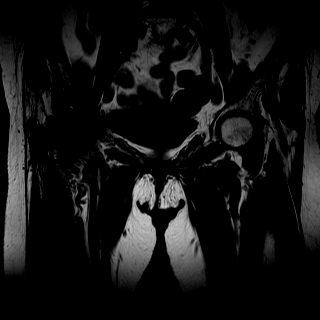
[im 17/22]
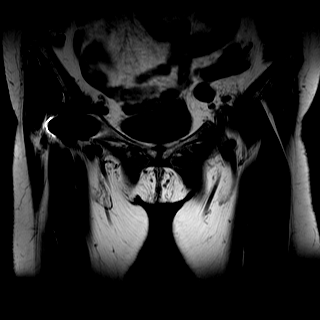
[im 22/22]
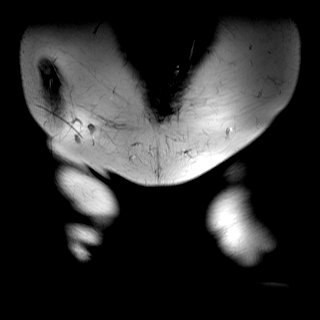

[Series 5: STIR · coronal · 5.0mm · 0.70mm/px · 7 of 24 slices shown]
[im 1/24]
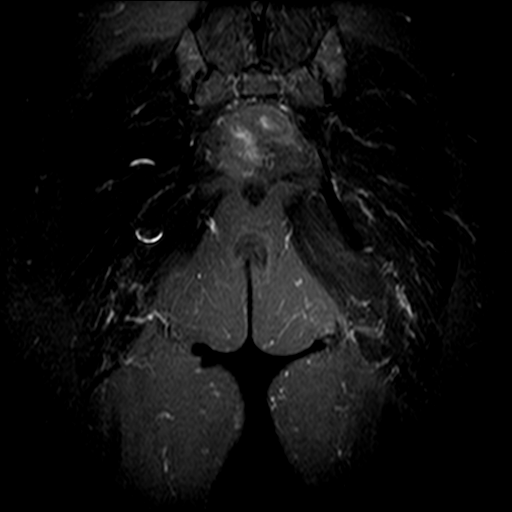
[im 4/24]
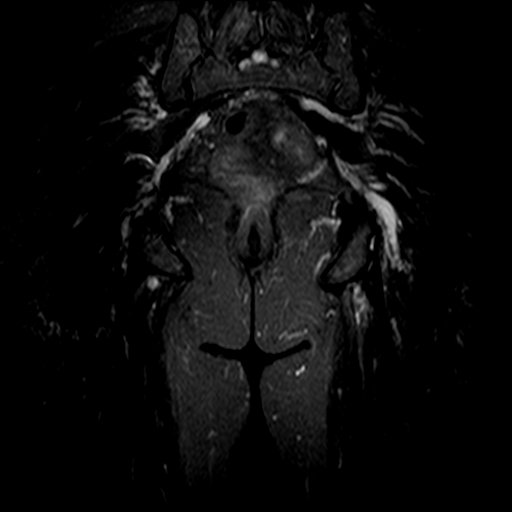
[im 8/24]
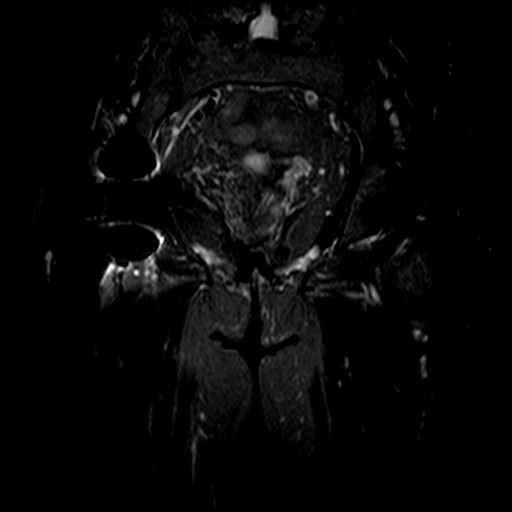
[im 12/24]
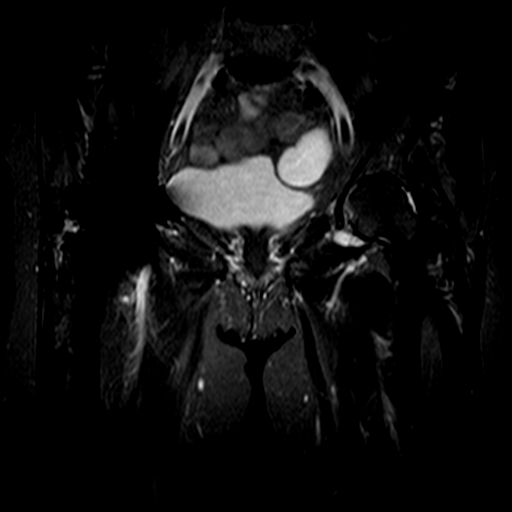
[im 16/24]
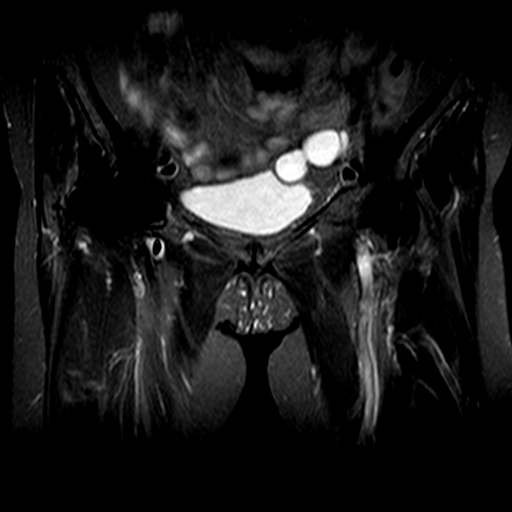
[im 20/24]
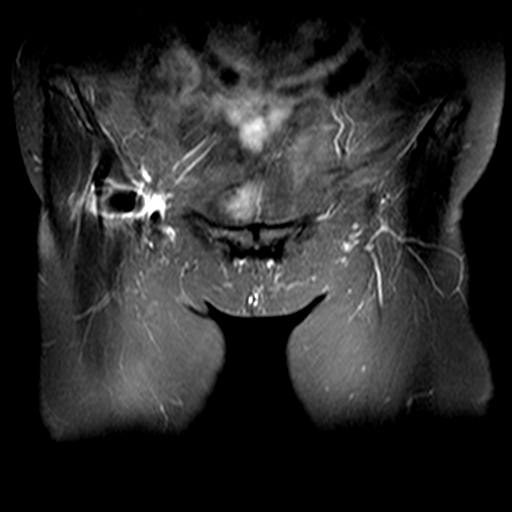
[im 24/24]
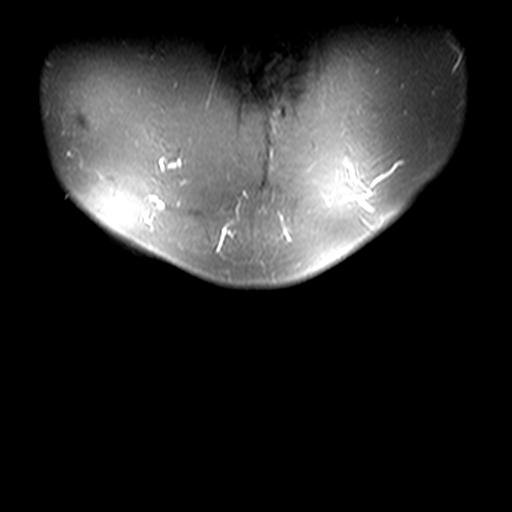

[Series 6: T2 · sagittal · 5.0mm · 0.76mm/px · 1 of 24 slices shown]
[im 1/24]
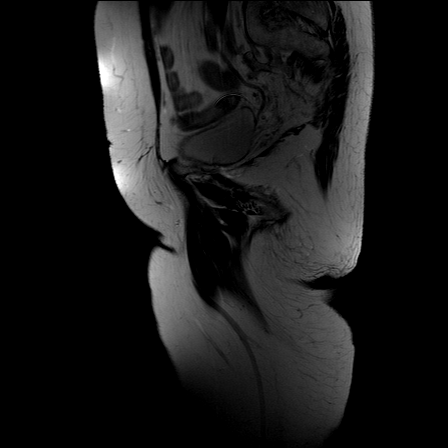

[27 of 40 positions shown; findings below may reference images not displayed]

FINDINGS: Bones: Right hip implant noted.  No osseous edema observed.

Articular cartilage and labrum

Articular cartilage:  Not applicable, there is an implant.

Labrum:  Not applicable

Joint or bursal effusion

Joint effusion:  No obvious fluid collection around the hip implant.

Bursae:  Unremarkable

Muscles and tendons

Muscles and tendons: Atrophy of much of the gluteus medius
centrally. Mild symmetric proximal hamstring tendinopathy.
Moderately atrophic right piriformis muscle.

Other findings

Miscellaneous: 5.7 by 3.0 cm serpentine fluid collection along the
left posterior urinary bladder.
IMPRESSION: 1. Dilated left distal ureteral segment versus hydrosalpinx -pelvic
sonography recommended.
2. Definite abnormal fluid collection or particulate disease around
the right hip implant. Atrophy of much of the right gluteus medius
centrally.
3. Symmetric proximal hamstring tendinopathy.

## 2016-06-27 NOTE — Progress Notes (Signed)
Patient states she had some nausea this morning.  Has epigastric pain after eating a meal.  Also states she gets short of breath when lying down.

## 2016-06-27 NOTE — Progress Notes (Addendum)
[facility] day:  06/27/16  Chief Complaint: Marilyn Romero is a 82 y.o. female with a history of stage II colon cancer who is seen for review of interval studies  HPI: The patient was last seen in the medical oncology clinic on 06/06/2016.  At that time, she noted some new abdominal symptoms.  Etiology was unclear, but possibly related to gastritis or gallbladder disease.  We discussed consideration of imaging given new symptoms (no imaging since diagnosis).  She had not followed up with GI after her gastroenterologist left the area.  She was referred for evaluation.  CBC, CMP, and CEA were normal.  Chest, abdomen and pelvic CT scan on 06/07/2016 revealed no evidence of metastatic disease.  There was no evidence of local tumor recurrence status post subtotal distal colectomy.  There was mild distal colonic diverticulosis, with no evidence acute diverticulitis.  There was moderate colonic stool could indicate constipation.  There was a simple 3.9 cm left adnexal cyst, stable x 12 months and consistent with a benign etiology.  Additional findings include aortic atherosclerosis and three-vessel coronary atherosclerosis.  She is scheduled for endoscopy with Dr. Allen Norris on 07/02/2016.  Symptomatically, she feels the same.  She notes epigastric pain.  She stats that she made herself throw-up.  She is drinking 7-Up.  Burping helps.   Past Medical History:  Diagnosis Date  . Cancer of sigmoid colon ([place]) 12/08/2014  . Colonic mass 10/07/2012   Overview:  Gets routine colonoscopies. Dx was 2013.  . DDD (degenerative disc disease), lumbar 12/16/2014  . Diverticulitis 06/27/2011  . Facet syndrome, lumbar 12/16/2014  . GERD (gastroesophageal reflux disease) 05/26/2012  . Insomnia 06/18/2016   Last Assessment & Plan:  Relevant Hx: Course: Daily Update: Today's Plan:  . Lesion of bladder 04/29/2012  . Lumbar radiculopathy 10/04/2015  . Sacroiliac joint dysfunction of  both sides 12/16/2014  . Status post total replacement of right hip 08/15/2015    Past Surgical History:  Procedure Laterality Date  . COLON RESECTION    . HIP SURGERY Right     Family History  Problem Relation Age of Onset  . Early death Mother   . Arthritis Father   . Hypertension Father     Social History:  reports that she has never smoked. She has never used smokeless tobacco. She reports that she does not drink alcohol or use drugs.  She lives in [place].  The patient is alone today.  Allergies: No Known Allergies  Current Medications: Current Outpatient Prescriptions  Medication Sig Dispense Refill  . citalopram (CELEXA) 10 MG tablet Take 10 mg by mouth daily.    Marland Kitchen ibuprofen (ADVIL,MOTRIN) 200 MG tablet Take 200 mg by mouth every 6 (six) hours as needed. Reported on 10/31/2015    . traMADol (ULTRAM) 50 MG tablet Take 50 mg by mouth every 4 (four) hours as needed.     Current Facility-Administered Medications  Medication Dose Route Frequency Provider Last Rate Last Dose  . bupivacaine (PF) (MARCAINE) 0.25 % injection 30 mL  30 mL Other Once Mohammed Kindle, MD      . bupivacaine (PF) (MARCAINE) 0.25 % injection 30 mL  30 mL Other Once Mohammed Kindle, MD      . ceFAZolin (ANCEF) IVPB 1 g/50 mL premix  1 g Intravenous Once Mohammed Kindle, MD      . fentaNYL (SUBLIMAZE) injection 100 mcg  100 mcg Intravenous Once Mohammed Kindle, MD      .  lactated ringers infusion 1,000 mL  1,000 mL Intravenous Continuous Mohammed Kindle, MD      . midazolam (VERSED) 5 MG/5ML injection 5 mg  5 mg Intravenous Once Mohammed Kindle, MD      . orphenadrine (NORFLEX) injection 60 mg  60 mg Intramuscular Once Mohammed Kindle, MD      . sodium chloride flush (NS) 0.9 % injection 20 mL  20 mL Other Once Mohammed Kindle, MD      . triamcinolone acetonide (KENALOG-40) injection 40 mg  40 mg Other Once Mohammed Kindle, MD      . triamcinolone acetonide (KENALOG-40) injection 40 mg  40 mg Other Once Mohammed Kindle, MD       . triamcinolone acetonide (KENALOG-40) injection 40 mg  40 mg Other Once Mohammed Kindle, MD        Review of Systems:  GENERAL:  Feels "the same".  No fevers or sweats.  Weight up 1 pound. PERFORMANCE STATUS (ECOG):  1 HEENT:  No visual changes, runny nose, sore throat, mouth sores or tenderness. Lungs: No shortness of breath or cough.  No hemoptysis. Cardiac:  No chest pain, palpitations, orthopnea, or PND. GI:  Epigastric pain after eating.  Nausea.  Forced emesis.  No diarrhea, constipation, melena or hematochezia. GU:  No urgency, frequency, dysuria, or hematuria. Musculoskeletal:  Hip pain, "permanent".  Back pain.  No longer receives steroid injections.  No muscle tenderness. Extremities:  No pain or swelling. Skin:  No rashes or skin changes. Neuro:  No headache, numbness or weakness, balance or coordination issues. Endocrine:  No diabetes, thyroid issues, hot flashes or night sweats. Psych:  No mood changes, anxiety or depression. Pain:  Back and hip pain. Review of systems:  All other systems reviewed and found to be negative.  Physical Exam: Blood pressure 135/84, pulse 77, temperature 98.9 F (37.2 C), temperature source Tympanic, resp. rate 18, weight 148 lb 13 oz (67.5 kg). GENERAL:  Well developed, well nourished, elderly woman sitting comfortably in the exam room in no acute distress.  MENTAL STATUS:  Alert and oriented to person, place and time. HEAD:  Wavy blonde hair.  Normocephalic, atraumatic, face symmetric, no Cushingoid features. EYES:  Brown eyes.  No conjunctivitis or scleral icterus. NEUROLOGICAL: Unremarkable. PSYCH:  Appropriate.   No visits with results within 3 Day(s) from this visit.  Latest known visit with results is:  Appointment on 06/06/2016  Component Date Value Ref Range Status  . WBC 06/06/2016 10.1  3.6 - 11.0 K/uL Final  . RBC 06/06/2016 4.70  3.80 - 5.20 MIL/uL Final  . Hemoglobin 06/06/2016 13.3  12.0 - 16.0 g/dL Final  . HCT  06/06/2016 41.0  35.0 - 47.0 % Final  . MCV 06/06/2016 87.2  80.0 - 100.0 fL Final  . MCH 06/06/2016 28.3  26.0 - 34.0 pg Final  . MCHC 06/06/2016 32.5  32.0 - 36.0 g/dL Final  . RDW 06/06/2016 14.1  11.5 - 14.5 % Final  . Platelets 06/06/2016 327  150 - 440 K/uL Final  . Neutrophils Relative % 06/06/2016 75  % Final  . Neutro Abs 06/06/2016 7.7* 1.4 - 6.5 K/uL Final  . Lymphocytes Relative 06/06/2016 11  % Final  . Lymphs Abs 06/06/2016 1.1  1.0 - 3.6 K/uL Final  . Monocytes Relative 06/06/2016 10  % Final  . Monocytes Absolute 06/06/2016 1.0* 0.2 - 0.9 K/uL Final  . Eosinophils Relative 06/06/2016 3  % Final  . Eosinophils Absolute 06/06/2016 0.2  0 -  0.7 K/uL Final  . Basophils Relative 06/06/2016 1  % Final  . Basophils Absolute 06/06/2016 0.1  0 - 0.1 K/uL Final  . Sodium 06/06/2016 140  135 - 145 mmol/L Final  . Potassium 06/06/2016 3.6  3.5 - 5.1 mmol/L Final  . Chloride 06/06/2016 109  101 - 111 mmol/L Final  . CO2 06/06/2016 24  22 - 32 mmol/L Final  . Glucose, Bld 06/06/2016 129* 65 - 99 mg/dL Final  . BUN 06/06/2016 16  6 - 20 mg/dL Final  . Creatinine, Ser 06/06/2016 0.76  0.44 - 1.00 mg/dL Final  . Calcium 06/06/2016 9.2  8.9 - 10.3 mg/dL Final  . Total Protein 06/06/2016 6.9  6.5 - 8.1 g/dL Final  . Albumin 06/06/2016 3.8  3.5 - 5.0 g/dL Final  . AST 06/06/2016 19  15 - 41 U/L Final  . ALT 06/06/2016 14  14 - 54 U/L Final  . Alkaline Phosphatase 06/06/2016 57  38 - 126 U/L Final  . Total Bilirubin 06/06/2016 0.4  0.3 - 1.2 mg/dL Final  . GFR calc non Af Amer 06/06/2016 >60  >60 mL/min Final  . GFR calc Af Amer 06/06/2016 >60  >60 mL/min Final   Comment: (NOTE) The eGFR has been calculated using the CKD EPI equation. This calculation has not been validated in all clinical situations. eGFR's persistently <60 mL/min signify possible Chronic Kidney Disease.   . Anion gap 06/06/2016 7  5 - 15 Final  . CEA 06/07/2016 2.9  0.0 - 4.7 ng/mL Final   Comment: (NOTE)        Roche ECLIA methodology       Nonsmokers  <3.9                                     Smokers     <5.6 Performed At: [facility] [address] [place], [place] [id number] Lindon Romp MD RE:[id number]     Assessment:  BREONA CHERUBIN is a 82 y.o. female with a history of stage II sigmoid colon cancer.  She underwent hemicolectomy in 10/01/2011.  Pathology revealed a 3.5 cm moderately differentiated adenocarcinoma that invaded through the muscularis propria into the pericolic tissue.  There was lymphovascular and perineural invasion. Ten nodes were negative.  Pathologic stage was T3N0M0.    Chest, abdomen and pelvic CT scan on 06/07/2016 revealed no evidence of metastatic disease.  There was no evidence of local tumor recurrence status post subtotal distal colectomy.  There was mild distal colonic diverticulosis and evidence of constipation.  CEA was 1.4 on 12/07/2011, 3.0 on 06/08/2015, 2.8 on 12/08/2015, and 2.9 on 06/06/2016.  She enrolled on NSABP P-5 which was a randomization between preventative daily rosuvastatin/Crestor versus placebo.  The study was eventualy closed secondary to poor accrual.  She notes a history of a benign lung tumor removal in 1974.  She denies any respiratory symptoms.  Symptomatically, she has epigastric discomfort and nausea.  She denies any melena or hematochezia.  Plan: 1.  Review labs and imaging studies.  No evidence of metastatic disease.  Etiology of symptoms unclear. 2.  Follow-up endoscopy on 07/02/2016. 3.  RTC in 6 months for MD assessment and labs (CBC with diff, CMP, CEA).   Lequita Asal, MD  06/27/2016, 11:03 AM

## 2016-06-28 NOTE — Discharge Instructions (Signed)

## 2016-07-02 ENCOUNTER — Ambulatory Visit: Payer: Medicare Other | Admitting: Anesthesiology

## 2016-07-02 ENCOUNTER — Encounter
Admission: RE | Disposition: A | Discharge status: Home / Self Care | Payer: Self-pay | Source: Ambulatory Visit | Attending: Gastroenterology

## 2016-07-02 ENCOUNTER — Ambulatory Visit
Admission: RE | Admit: 2016-07-02 | Discharge: 2016-07-02 | Disposition: A | Discharge status: Home / Self Care | Payer: Medicare Other | Source: Ambulatory Visit | Attending: Gastroenterology | Admitting: Gastroenterology

## 2016-07-02 DIAGNOSIS — K253 Acute gastric ulcer without hemorrhage or perforation: Secondary | ICD-10-CM | POA: Diagnosis not present

## 2016-07-02 DIAGNOSIS — K219 Gastro-esophageal reflux disease without esophagitis: Secondary | ICD-10-CM | POA: Diagnosis not present

## 2016-07-02 DIAGNOSIS — K5730 Diverticulosis of large intestine without perforation or abscess without bleeding: Secondary | ICD-10-CM | POA: Diagnosis not present

## 2016-07-02 DIAGNOSIS — Z85038 Personal history of other malignant neoplasm of large intestine: Secondary | ICD-10-CM | POA: Diagnosis not present

## 2016-07-02 DIAGNOSIS — Z1211 Encounter for screening for malignant neoplasm of colon: Secondary | ICD-10-CM | POA: Insufficient documentation

## 2016-07-02 DIAGNOSIS — K2970 Gastritis, unspecified, without bleeding: Secondary | ICD-10-CM | POA: Diagnosis not present

## 2016-07-02 DIAGNOSIS — K641 Second degree hemorrhoids: Secondary | ICD-10-CM | POA: Insufficient documentation

## 2016-07-02 DIAGNOSIS — Z96641 Presence of right artificial hip joint: Secondary | ICD-10-CM | POA: Insufficient documentation

## 2016-07-02 DIAGNOSIS — R1013 Epigastric pain: Secondary | ICD-10-CM | POA: Diagnosis not present

## 2016-07-02 DIAGNOSIS — Z980 Intestinal bypass and anastomosis status: Secondary | ICD-10-CM | POA: Insufficient documentation

## 2016-07-02 DIAGNOSIS — Z79899 Other long term (current) drug therapy: Secondary | ICD-10-CM | POA: Insufficient documentation

## 2016-07-02 DIAGNOSIS — K259 Gastric ulcer, unspecified as acute or chronic, without hemorrhage or perforation: Secondary | ICD-10-CM | POA: Insufficient documentation

## 2016-07-02 HISTORY — PX: ESOPHAGOGASTRODUODENOSCOPY (EGD) WITH PROPOFOL: SHX5813

## 2016-07-02 HISTORY — PX: COLONOSCOPY WITH PROPOFOL: SHX5780

## 2016-07-02 SURGERY — COLONOSCOPY WITH PROPOFOL
Anesthesia: Monitor Anesthesia Care | Site: Rectum | Wound class: Contaminated

## 2016-07-02 MED ORDER — GLYCOPYRROLATE 0.2 MG/ML IJ SOLN
INTRAMUSCULAR | Status: DC | PRN
  Administered 2016-07-02: 0.2 mg via INTRAVENOUS

## 2016-07-02 MED ORDER — LIDOCAINE HCL (CARDIAC) 20 MG/ML IV SOLN
INTRAVENOUS | Status: DC | PRN
  Administered 2016-07-02: 40 mg via INTRAVENOUS

## 2016-07-02 MED ORDER — STERILE WATER FOR IRRIGATION IR SOLN
Status: DC | PRN
  Administered 2016-07-02: 09:00:00

## 2016-07-02 MED ORDER — LACTATED RINGERS IV SOLN
INTRAVENOUS | Status: DC
  Administered 2016-07-02: 09:00:00 via INTRAVENOUS

## 2016-07-02 MED ORDER — PANTOPRAZOLE SODIUM 40 MG PO TBEC: 40.0000 mg | DELAYED_RELEASE_TABLET | Freq: Every day | ORAL | 11 refills | Status: DC

## 2016-07-02 MED ORDER — PROPOFOL 10 MG/ML IV BOLUS
INTRAVENOUS | Status: DC | PRN
  Administered 2016-07-02: 10 mg via INTRAVENOUS
  Administered 2016-07-02 (×3): 20 mg via INTRAVENOUS
  Administered 2016-07-02: 10 mg via INTRAVENOUS
  Administered 2016-07-02 (×4): 20 mg via INTRAVENOUS
  Administered 2016-07-02: 60 mg via INTRAVENOUS

## 2016-07-02 SURGICAL SUPPLY — 35 items
BALLN DILATOR 10-12 8 (BALLOONS)
BALLN DILATOR 12-15 8 (BALLOONS)
BALLN DILATOR 15-18 8 (BALLOONS)
BALLN DILATOR CRE 0-12 8 (BALLOONS)
BALLN DILATOR ESOPH 8 10 CRE (MISCELLANEOUS) IMPLANT
BALLOON DILATOR 12-15 8 (BALLOONS) IMPLANT
BALLOON DILATOR 15-18 8 (BALLOONS) IMPLANT
BALLOON DILATOR CRE 0-12 8 (BALLOONS) IMPLANT
BLOCK BITE 60FR ADLT L/F GRN (MISCELLANEOUS) ×4 IMPLANT
CANISTER SUCT 1200ML W/VALVE (MISCELLANEOUS) ×4 IMPLANT
CLIP HMST 235XBRD CATH ROT (MISCELLANEOUS) IMPLANT
CLIP RESOLUTION 360 11X235 (MISCELLANEOUS)
FCP ESCP3.2XJMB 240X2.8X (MISCELLANEOUS)
FORCEPS BIOP RAD 4 LRG CAP 4 (CUTTING FORCEPS) ×4 IMPLANT
FORCEPS BIOP RJ4 240 W/NDL (MISCELLANEOUS)
FORCEPS ESCP3.2XJMB 240X2.8X (MISCELLANEOUS) IMPLANT
GOWN CVR UNV OPN BCK APRN NK (MISCELLANEOUS) ×4 IMPLANT
GOWN ISOL THUMB LOOP REG UNIV (MISCELLANEOUS) ×4
INJECTOR VARIJECT VIN23 (MISCELLANEOUS) IMPLANT
KIT DEFENDO VALVE AND CONN (KITS) IMPLANT
KIT ENDO PROCEDURE OLY (KITS) ×4 IMPLANT
MARKER SPOT ENDO TATTOO 5ML (MISCELLANEOUS) IMPLANT
PAD GROUND ADULT SPLIT (MISCELLANEOUS) IMPLANT
PROBE APC STR FIRE (PROBE) IMPLANT
RETRIEVER NET PLAT FOOD (MISCELLANEOUS) IMPLANT
RETRIEVER NET ROTH 2.5X230 LF (MISCELLANEOUS) IMPLANT
SNARE SHORT THROW 13M SML OVAL (MISCELLANEOUS) IMPLANT
SNARE SHORT THROW 30M LRG OVAL (MISCELLANEOUS) IMPLANT
SNARE SNG USE RND 15MM (INSTRUMENTS) IMPLANT
SPOT EX ENDOSCOPIC TATTOO (MISCELLANEOUS)
SYR INFLATION 60ML (SYRINGE) IMPLANT
TRAP ETRAP POLY (MISCELLANEOUS) IMPLANT
VARIJECT INJECTOR VIN23 (MISCELLANEOUS)
WATER STERILE IRR 250ML POUR (IV SOLUTION) ×4 IMPLANT
WIRE CRE 18-20MM 8CM F G (MISCELLANEOUS) IMPLANT

## 2016-07-02 NOTE — Transfer of Care (Signed)
Immediate Anesthesia Transfer of Care Note  Patient: Marilyn Romero  Procedure(s) Performed: Procedure(s): COLONOSCOPY WITH PROPOFOL (N/A) ESOPHAGOGASTRODUODENOSCOPY (EGD) WITH PROPOFOL (N/A)  Patient Location: PACU  Anesthesia Type: MAC  Level of Consciousness: awake, alert  and patient cooperative  Airway and Oxygen Therapy: Patient Spontanous Breathing and Patient connected to supplemental oxygen  Post-op Assessment: Post-op Vital signs reviewed, Patient's Cardiovascular Status Stable, Respiratory Function Stable, Patent Airway and No signs of Nausea or vomiting  Post-op Vital Signs: Reviewed and stable  Complications: No apparent anesthesia complications

## 2016-07-02 NOTE — H&P (Signed)
Lucilla Lame, MD [address] [place], [place] [address] Phone: [phone] Fax : [phone]  Primary Care Physician:  Gayland Curry, MD Primary Gastroenterologist:  Dr. Allen Norris  Pre-Procedure History & Physical: HPI:  Marilyn Romero is a 82 y.o. female is here for an endoscopy and colonoscopy.   Past Medical History:  Diagnosis Date  . Cancer of sigmoid colon ([place]) 12/08/2014  . Colonic mass 10/07/2012   Overview:  Gets routine colonoscopies. Dx was 2013.  . DDD (degenerative disc disease), lumbar 12/16/2014  . Diverticulitis 06/27/2011  . Facet syndrome, lumbar 12/16/2014  . GERD (gastroesophageal reflux disease) 05/26/2012  . Insomnia 06/18/2016   Last Assessment & Plan:  Relevant Hx: Course: Daily Update: Today's Plan:  . Lesion of bladder 04/29/2012  . Lumbar radiculopathy 10/04/2015  . Sacroiliac joint dysfunction of both sides 12/16/2014  . Status post total replacement of right hip 08/15/2015    Past Surgical History:  Procedure Laterality Date  . COLON RESECTION    . HIP SURGERY Right   . LUNG SURGERY     non-malignant mass on lung    Prior to Admission medications   Medication Sig Start Date End Date Taking? Authorizing Provider  acetaminophen (TYLENOL) 325 MG tablet Take 650 mg by mouth every 6 (six) hours as needed.   Yes Historical Provider, MD  escitalopram (LEXAPRO) 10 MG tablet Take 10 mg by mouth at bedtime.   Yes Historical Provider, MD  ibuprofen (ADVIL,MOTRIN) 200 MG tablet Take 200 mg by mouth every 6 (six) hours as needed. Reported on 10/31/2015   Yes Historical Provider, MD  traMADol (ULTRAM) 50 MG tablet Take 50 mg by mouth every 4 (four) hours as needed.   Yes Historical Provider, MD  citalopram (CELEXA) 10 MG tablet Take 10 mg by mouth daily.    Historical Provider, MD    Allergies as of 06/18/2016  . (No Known Allergies)    Family History  Problem Relation Age of Onset  . Early death Mother   . Arthritis Father   . Hypertension  Father     Social History   Social History  . Marital status: Divorced    Spouse name: N/A  . Number of children: N/A  . Years of education: N/A   Occupational History  . Not on file.   Social History Main Topics  . Smoking status: Never Smoker  . Smokeless tobacco: Never Used  . Alcohol use No  . Drug use: No  . Sexual activity: No   Other Topics Concern  . Not on file   Social History Narrative  . No narrative on file    Review of Systems: See HPI, otherwise negative ROS  Physical Exam: BP 137/70   Pulse 78   Temp 97.8 F (36.6 C) (Temporal)   Resp 16   Ht 5\' 5"  (1.651 m)   Wt 147 lb (66.7 kg)   SpO2 100%   BMI 24.46 kg/m  General:   Alert,  pleasant and cooperative in NAD Head:  Normocephalic and atraumatic. Neck:  Supple; no masses or thyromegaly. Lungs:  Clear throughout to auscultation.    Heart:  Regular rate and rhythm. Abdomen:  Soft, nontender and nondistended. Normal bowel sounds, without guarding, and without rebound.   Neurologic:  Alert and  oriented x4;  grossly normal neurologically.  Impression/Plan: [name] is here for an endoscopy and colonoscopy to be performed for Epigastric pain history of colon cancer.  Risks, benefits, limitations, and alternatives regarding  endoscopy and colonoscopy have been reviewed with the patient.  Questions have been answered.  All parties agreeable.   Lucilla Lame, MD  07/02/2016, 8:42 AM

## 2016-07-02 NOTE — Op Note (Signed)
[facility] Patient Name: Marilyn Romero Procedure Date: 07/02/2016 9:08 AM MRN: [id number] Account #: [id number] Date of Birth: 10/24/1933 Admit Type: Outpatient Age: 82 Room: [facility] OR ROOM 01 Gender: Female Note Status: Finalized Procedure:            Upper GI endoscopy Indications:          Epigastric abdominal pain Providers:            Lucilla Lame MD, MD Referring MD:         Gayland Curry MD, MD (Referring MD) Medicines:            Propofol per Anesthesia Complications:        No immediate complications. Procedure:            Pre-Anesthesia Assessment:                       - Prior to the procedure, a History and Physical was                        performed, and patient medications and allergies were                        reviewed. The patient's tolerance of previous                        anesthesia was also reviewed. The risks and benefits of                        the procedure and the sedation options and risks were                        discussed with the patient. All questions were                        answered, and informed consent was obtained. Prior                        Anticoagulants: The patient has taken no previous                        anticoagulant or antiplatelet agents. ASA Grade                        Assessment: II - A patient with mild systemic disease.                        After reviewing the risks and benefits, the patient was                        deemed in satisfactory condition to undergo the                        procedure.                       After obtaining informed consent, the endoscope was                        passed under direct vision. Throughout the procedure,  the patient's blood pressure, pulse, and oxygen                        saturations were monitored continuously. The Olympus                        [id number]-HQ190 Endoscope (S#. [id number]) was introduced        through the mouth, and advanced to the second part of                        duodenum. The upper GI endoscopy was accomplished                        without difficulty. The patient tolerated the procedure                        well. Findings:      The examined esophagus was normal.      One non-bleeding cratered gastric ulcer with no stigmata of bleeding was       found in the gastric antrum. Biopsies were taken with a cold forceps for       histology.      Localized moderate inflammation characterized by erosions was found in       the gastric antrum.      The examined duodenum was normal. Impression:           - Normal esophagus.                       - Non-bleeding gastric ulcer with no stigmata of                        bleeding. Biopsied.                       - Gastritis.                       - Normal examined duodenum. Recommendation:       - Discharge patient to home.                       - Resume previous diet.                       - Use a proton pump inhibitor PO daily.                       - Avoid anti inflammatory medications. Procedure Code(s):    --- Professional ---                       [id number], Esophagogastroduodenoscopy, flexible, transoral;                        with biopsy, single or multiple Diagnosis Code(s):    --- Professional ---                       R10.13, Epigastric pain                       K25.9, Gastric ulcer, unspecified as acute or chronic,  without hemorrhage or perforation                       K29.70, Gastritis, unspecified, without bleeding CPT copyright 2016 American Medical Association. All rights reserved. The codes documented in this report are preliminary and upon coder review may  be revised to meet current compliance requirements. Lucilla Lame MD, MD 07/02/2016 9:27:37 AM This report has been signed electronically. Number of Addenda: 0 Note Initiated On: 07/02/2016 9:08 AM      [facility]

## 2016-07-02 NOTE — Anesthesia Postprocedure Evaluation (Signed)
Anesthesia Post Note  Patient: Marilyn Romero  Procedure(s) Performed: Procedure(s) (LRB): COLONOSCOPY WITH PROPOFOL (N/A) ESOPHAGOGASTRODUODENOSCOPY (EGD) WITH PROPOFOL (N/A)  Patient location during evaluation: PACU Anesthesia Type: MAC Level of consciousness: awake and alert and oriented Pain management: satisfactory to patient Vital Signs Assessment: post-procedure vital signs reviewed and stable Respiratory status: spontaneous breathing, nonlabored ventilation and respiratory function stable Cardiovascular status: blood pressure returned to baseline and stable Postop Assessment: Adequate PO intake and No signs of nausea or vomiting Anesthetic complications: no    Raliegh Ip

## 2016-07-02 NOTE — Anesthesia Preprocedure Evaluation (Signed)
Anesthesia Evaluation  Patient identified by MRN, date of birth, ID band Patient awake    Reviewed: Allergy & Precautions, H&P , NPO status , Patient's Chart, lab work & pertinent test results  Airway Mallampati: II  TM Distance: >3 FB Neck ROM: full    Dental no notable dental hx.    Pulmonary    Pulmonary exam normal        Cardiovascular Normal cardiovascular exam     Neuro/Psych  Neuromuscular disease    GI/Hepatic GERD  ,  Endo/Other    Renal/GU      Musculoskeletal   Abdominal   Peds  Hematology   Anesthesia Other Findings   Reproductive/Obstetrics                             Anesthesia Physical Anesthesia Plan  ASA: II  Anesthesia Plan: MAC   Post-op Pain Management:    Induction:   Airway Management Planned:   Additional Equipment:   Intra-op Plan:   Post-operative Plan:   Informed Consent: I have reviewed the patients History and Physical, chart, labs and discussed the procedure including the risks, benefits and alternatives for the proposed anesthesia with the patient or authorized representative who has indicated his/her understanding and acceptance.     Plan Discussed with:   Anesthesia Plan Comments:         Anesthesia Quick Evaluation

## 2016-07-02 NOTE — Op Note (Signed)
[facility] Patient Name: Marilyn Romero Procedure Date: 07/02/2016 9:17 AM MRN: [id number] Account #: [id number] Date of Birth: 11-17-1933 Admit Type: Outpatient Age: 82 Room: [facility] OR ROOM 01 Gender: Female Note Status: Finalized Procedure:            Colonoscopy Indications:          High risk colon cancer surveillance: Personal history                        of colon cancer Providers:            Lucilla Lame MD, MD Referring MD:         Gayland Curry MD, MD (Referring MD) Medicines:            Propofol per Anesthesia Complications:        No immediate complications. Procedure:            Pre-Anesthesia Assessment:                       - Prior to the procedure, a History and Physical was                        performed, and patient medications and allergies were                        reviewed. The patient's tolerance of previous                        anesthesia was also reviewed. The risks and benefits of                        the procedure and the sedation options and risks were                        discussed with the patient. All questions were                        answered, and informed consent was obtained. Prior                        Anticoagulants: The patient has taken no previous                        anticoagulant or antiplatelet agents. ASA Grade                        Assessment: II - A patient with mild systemic disease.                        After reviewing the risks and benefits, the patient was                        deemed in satisfactory condition to undergo the                        procedure.                       After obtaining informed consent, the colonoscope was  passed under direct vision. Throughout the procedure,                        the patient's blood pressure, pulse, and oxygen                        saturations were monitored continuously. The Olympus   CF-HQ190L Colonoscope (S#. [id number]) was introduced                        through the anus and advanced to the the cecum,                        identified by appendiceal orifice and ileocecal valve.                        The colonoscopy was performed without difficulty. The                        patient tolerated the procedure well. The quality of                        the bowel preparation was excellent. Findings:      The perianal and digital rectal examinations were normal.      Multiple small-mouthed diverticula were found in the entire colon.      There was evidence of a prior end-to-end colo-colonic anastomosis in the       sigmoid colon. This was patent.      Non-bleeding internal hemorrhoids were found during retroflexion. The       hemorrhoids were Grade II (internal hemorrhoids that prolapse but reduce       spontaneously). Impression:           - Diverticulosis in the entire examined colon.                       - Patent end-to-end colo-colonic anastomosis.                       - Non-bleeding internal hemorrhoids.                       - No specimens collected. Recommendation:       - Discharge patient to home.                       - Resume previous diet.                       - Continue present medications. Procedure Code(s):    --- Professional ---                       [phone], Colonoscopy, flexible; diagnostic, including                        collection of specimen(s) by brushing or washing, when                        performed (separate procedure) Diagnosis Code(s):    --- Professional ---                       [id number],  Personal history of other malignant neoplasm                        of large intestine CPT copyright 2016 American Medical Association. All rights reserved. The codes documented in this report are preliminary and upon coder review may  be revised to meet current compliance requirements. Lucilla Lame MD, MD 07/02/2016 9:42:49 AM This report has been  signed electronically. Number of Addenda: 0 Note Initiated On: 07/02/2016 9:17 AM Scope Withdrawal Time: 0 hours 5 minutes 50 seconds  Total Procedure Duration: 0 hours 10 minutes 5 seconds       [facility]

## 2016-07-02 NOTE — Anesthesia Procedure Notes (Signed)
Procedure Name: MAC Performed by: Fredis Malkiewicz Pre-anesthesia Checklist: Patient identified, Emergency Drugs available, Suction available, Timeout performed and Patient being monitored Patient Re-evaluated:Patient Re-evaluated prior to inductionOxygen Delivery Method: Nasal cannula Placement Confirmation: positive ETCO2       

## 2016-07-03 ENCOUNTER — Encounter: Payer: Self-pay | Admitting: Gastroenterology

## 2016-07-04 ENCOUNTER — Encounter: Payer: Self-pay | Admitting: Gastroenterology

## 2016-07-05 ENCOUNTER — Encounter: Payer: Self-pay | Admitting: Gastroenterology

## 2016-08-12 ENCOUNTER — Encounter: Payer: Self-pay | Admitting: Hematology and Oncology

## 2016-09-14 DIAGNOSIS — M7541 Impingement syndrome of right shoulder: Secondary | ICD-10-CM | POA: Insufficient documentation

## 2016-11-08 IMAGING — CR DG CHEST 2V
2 series · 2 of 2 positions shown · non-contrast
Comparison: Chest CTs 05/17/2010.

CLINICAL DATA: Palpable mass on the LEFT lower ribs. Enlarging
chest wall mass. History of colon cancer.

EXAM:
CHEST  2 VIEW

[chest pa]
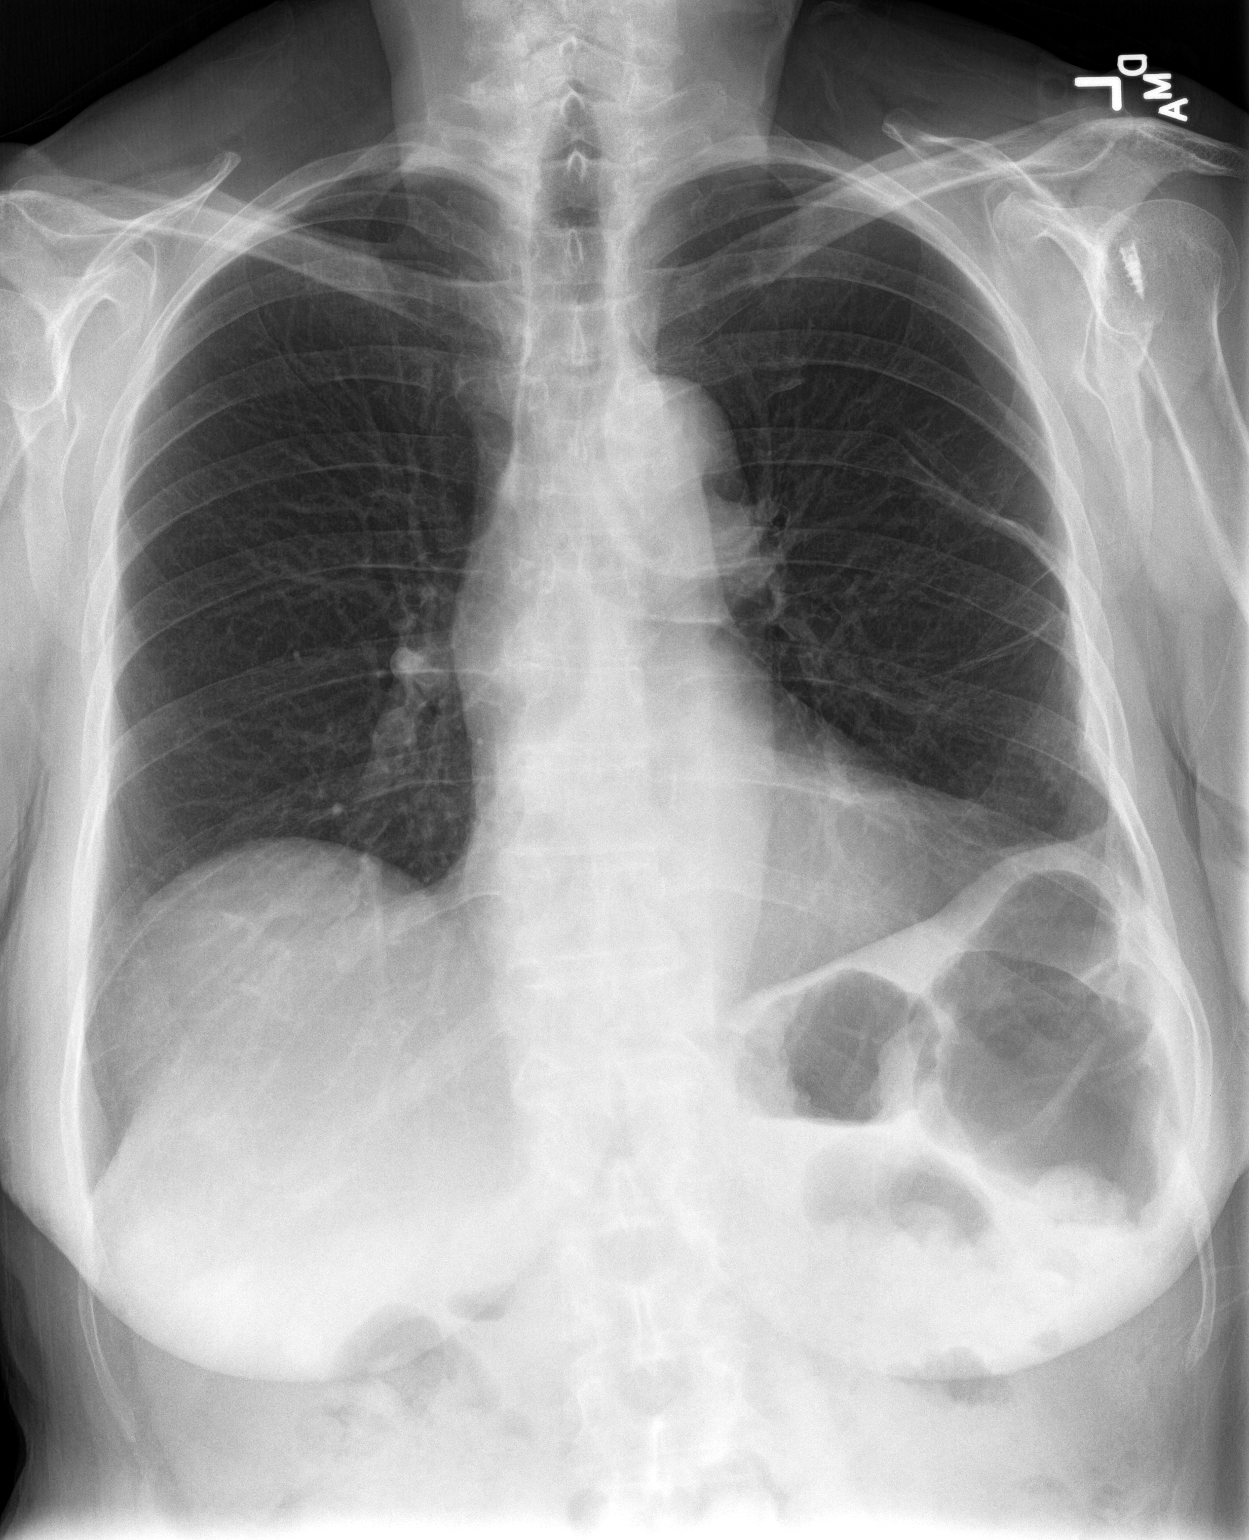

[chest lat]
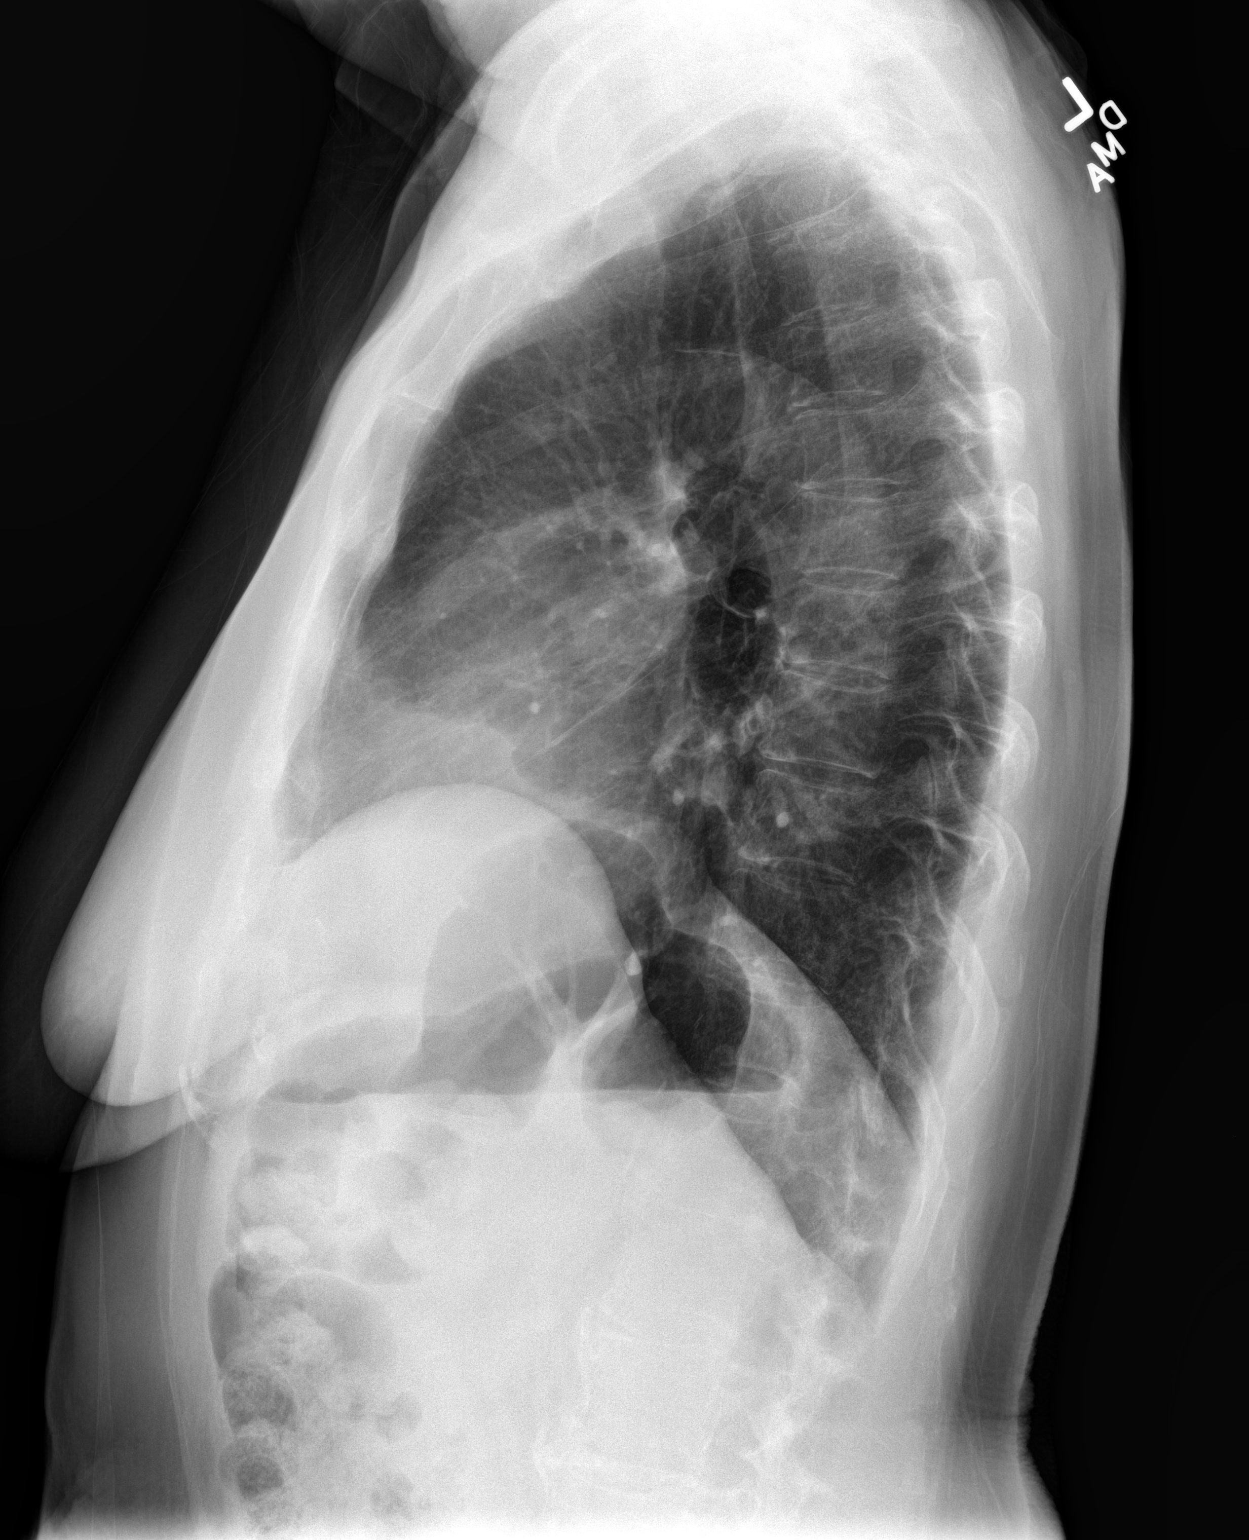

[2 of 2 positions shown; findings below may reference images not displayed]

FINDINGS: Dysmorphic LEFT posterior sixth rib is chronic and likely
posttraumatic. This was present on the prior chest CT of 05/17/2010.
No expansile rib lesions are identified. No chest wall mass.
Scarring is present in the LEFT lung around the area of rib
deformity, also likely posttraumatic. The RIGHT lung appears clear.
Thoracic spine is within normal limits. Cardiopericardial silhouette
normal. Mild tortuosity of the thoracic aorta. Blunting of the LEFT
costophrenic angle also appears chronic and was present on the prior
CT.
IMPRESSION: No destructive osseous lesion identified by radiography.
Posttraumatic deformity of the LEFT posterior sixth rib with
associated pulmonary parenchymal scarring. If there is persistent
clinical concern for chest wall mass, CT with contrast should be
obtained.

## 2016-12-26 ENCOUNTER — Encounter: Payer: Self-pay | Admitting: Hematology and Oncology

## 2016-12-26 ENCOUNTER — Inpatient Hospital Stay (HOSPITAL_BASED_OUTPATIENT_CLINIC_OR_DEPARTMENT_OTHER): Payer: Medicare Other | Admitting: Hematology and Oncology

## 2016-12-26 ENCOUNTER — Inpatient Hospital Stay: Payer: Medicare Other | Attending: Hematology and Oncology

## 2016-12-26 VITALS — BP 156/77 | HR 68 | Temp 96.7°F | Wt 154.1 lb

## 2016-12-26 DIAGNOSIS — K259 Gastric ulcer, unspecified as acute or chronic, without hemorrhage or perforation: Secondary | ICD-10-CM

## 2016-12-26 DIAGNOSIS — Z79899 Other long term (current) drug therapy: Secondary | ICD-10-CM | POA: Diagnosis not present

## 2016-12-26 DIAGNOSIS — Z85038 Personal history of other malignant neoplasm of large intestine: Secondary | ICD-10-CM | POA: Diagnosis present

## 2016-12-26 DIAGNOSIS — Z9049 Acquired absence of other specified parts of digestive tract: Secondary | ICD-10-CM

## 2016-12-26 DIAGNOSIS — R1013 Epigastric pain: Secondary | ICD-10-CM | POA: Diagnosis not present

## 2016-12-26 DIAGNOSIS — R110 Nausea: Secondary | ICD-10-CM | POA: Diagnosis not present

## 2016-12-26 DIAGNOSIS — M5116 Intervertebral disc disorders with radiculopathy, lumbar region: Secondary | ICD-10-CM

## 2016-12-26 DIAGNOSIS — M1990 Unspecified osteoarthritis, unspecified site: Secondary | ICD-10-CM

## 2016-12-26 DIAGNOSIS — C187 Malignant neoplasm of sigmoid colon: Secondary | ICD-10-CM

## 2016-12-26 DIAGNOSIS — K219 Gastro-esophageal reflux disease without esophagitis: Secondary | ICD-10-CM | POA: Insufficient documentation

## 2016-12-26 LAB — CBC WITH DIFFERENTIAL/PLATELET
Basophils Absolute: 0.1 10*3/uL (ref 0–0.1)
Basophils Relative: 2 %
Eosinophils Absolute: 0.2 10*3/uL (ref 0–0.7)
Eosinophils Relative: 4 %
HCT: 37.3 % (ref 35.0–47.0)
Hemoglobin: 12.2 g/dL (ref 12.0–16.0)
Lymphocytes Relative: 21 %
Lymphs Abs: 1.2 10*3/uL (ref 1.0–3.6)
MCH: 28.8 pg (ref 26.0–34.0)
MCHC: 32.8 g/dL (ref 32.0–36.0)
MCV: 87.9 fL (ref 80.0–100.0)
Monocytes Absolute: 0.8 10*3/uL (ref 0.2–0.9)
Monocytes Relative: 15 %
Neutro Abs: 3.4 10*3/uL (ref 1.4–6.5)
Neutrophils Relative %: 60 %
Platelets: 282 10*3/uL (ref 150–440)
RBC: 4.25 MIL/uL (ref 3.80–5.20)
RDW: 15 % — ABNORMAL HIGH (ref 11.5–14.5)
WBC: 5.8 10*3/uL (ref 3.6–11.0)

## 2016-12-26 LAB — COMPREHENSIVE METABOLIC PANEL
ALT: 15 U/L (ref 14–54)
AST: 18 U/L (ref 15–41)
Albumin: 3.9 g/dL (ref 3.5–5.0)
Alkaline Phosphatase: 48 U/L (ref 38–126)
Anion gap: 6 (ref 5–15)
BUN: 18 mg/dL (ref 6–20)
CO2: 22 mmol/L (ref 22–32)
Calcium: 8.7 mg/dL — ABNORMAL LOW (ref 8.9–10.3)
Chloride: 108 mmol/L (ref 101–111)
Creatinine, Ser: 0.8 mg/dL (ref 0.44–1.00)
GFR calc Af Amer: >60 mL/min (ref >60)
GFR calc non Af Amer: >60 mL/min (ref >60)
Glucose, Bld: 84 mg/dL (ref 65–99)
Potassium: 3.8 mmol/L (ref 3.5–5.1)
Sodium: 136 mmol/L (ref 135–145)
Total Bilirubin: 0.9 mg/dL (ref 0.3–1.2)
Total Protein: 6.8 g/dL (ref 6.5–8.1)

## 2016-12-26 NOTE — Progress Notes (Signed)
Patient offers no complaints today. 

## 2016-12-26 NOTE — Progress Notes (Signed)
[facility] day:  12/26/16  Chief Complaint: Marilyn Romero is a 83 y.o. female with a history of stage II colon cancer who is seen for 6 month assessment.  HPI: The patient was last seen in the medical oncology clinic on 06/27/2016.  At that time, she had epigastric discomfort and nausea.  She denied any melena or hematochezia.  CT scans revealed no evidence of metastatic disease.  The etiology of her symptoms was unclear.  She was scheduled for endoscopy.  EGD on 07/02/2016 by Dr. Allen Norris revealed a non-bleeding gastric ulcer with no stigmata of bleeding and gastritis.  Pathology was consistent with a healing ulcer.  There was no H pylori.  Colonoscopy on 07/02/2016 revealed diverticulosis in the entire examined colon with a patent end-to-end colo-colonic anastomosis.  Symptomatically, she is feeling much better.  She is on Protonix.  She notes arthritis in her shoulders and a torn right sided rotator cuff s/p injection.   Past Medical History:  Diagnosis Date  . Cancer of sigmoid colon ([place]) 12/08/2014  . Colonic mass 10/07/2012   Overview:  Gets routine colonoscopies. Dx was 2013.  . DDD (degenerative disc disease), lumbar 12/16/2014  . Diverticulitis 06/27/2011  . Facet syndrome, lumbar ([place]) 12/16/2014  . GERD (gastroesophageal reflux disease) 05/26/2012  . Insomnia 06/18/2016   Last Assessment & Plan:  Relevant Hx: Course: Daily Update: Today's Plan:  . Lesion of bladder 04/29/2012  . Lumbar radiculopathy 10/04/2015  . Sacroiliac joint dysfunction of both sides 12/16/2014  . Status post total replacement of right hip 08/15/2015    Past Surgical History:  Procedure Laterality Date  . COLON RESECTION    . COLONOSCOPY WITH PROPOFOL N/A 07/02/2016   Procedure: COLONOSCOPY WITH PROPOFOL;  Surgeon: Lucilla Lame, MD;  Location: [facility];  Service: Endoscopy;  Laterality: N/A;  . ESOPHAGOGASTRODUODENOSCOPY (EGD) WITH PROPOFOL N/A 07/02/2016   Procedure: ESOPHAGOGASTRODUODENOSCOPY (EGD) WITH PROPOFOL;  Surgeon: Lucilla Lame, MD;  Location: [facility];  Service: Endoscopy;  Laterality: N/A;  . HIP SURGERY Right   . LUNG SURGERY     non-malignant mass on lung    Family History  Problem Relation Age of Onset  . Early death Mother   . Arthritis Father   . Hypertension Father     Social History:  reports that she has never smoked. She has never used smokeless tobacco. She reports that she does not drink alcohol or use drugs.  She lives in [place].  The patient is alone today.  Allergies: No Known Allergies  Current Medications: Current Outpatient Prescriptions  Medication Sig Dispense Refill  . acetaminophen (TYLENOL) 325 MG tablet Take 650 mg by mouth every 6 (six) hours as needed.    . citalopram (CELEXA) 10 MG tablet Take 10 mg by mouth daily.    Marland Kitchen escitalopram (LEXAPRO) 10 MG tablet Take 10 mg by mouth at bedtime.    Marland Kitchen ibuprofen (ADVIL,MOTRIN) 200 MG tablet Take 200 mg by mouth every 6 (six) hours as needed. Reported on 10/31/2015    . meloxicam (MOBIC) 7.5 MG tablet Take 7.5 mg by mouth daily.    . pantoprazole (PROTONIX) 40 MG tablet Take 1 tablet (40 mg total) by mouth daily. 30 tablet 11  . traMADol (ULTRAM) 50 MG tablet Take 50 mg by mouth every 4 (four) hours as needed.     Current Facility-Administered Medications  Medication Dose Route Frequency Provider Last Rate Last Dose  . bupivacaine (PF) (MARCAINE)  0.25 % injection 30 mL  30 mL Other Once Mohammed Kindle, MD      . bupivacaine (PF) (MARCAINE) 0.25 % injection 30 mL  30 mL Other Once Mohammed Kindle, MD      . ceFAZolin (ANCEF) IVPB 1 g/50 mL premix  1 g Intravenous Once Mohammed Kindle, MD      . fentaNYL (SUBLIMAZE) injection 100 mcg  100 mcg Intravenous Once Mohammed Kindle, MD      . lactated ringers infusion 1,000 mL  1,000 mL Intravenous Continuous Mohammed Kindle, MD      . midazolam (VERSED) 5 MG/5ML injection 5 mg  5 mg Intravenous Once Mohammed Kindle, MD      . orphenadrine (NORFLEX) injection 60 mg  60 mg Intramuscular Once Mohammed Kindle, MD      . sodium chloride flush (NS) 0.9 % injection 20 mL  20 mL Other Once Mohammed Kindle, MD      . triamcinolone acetonide (KENALOG-40) injection 40 mg  40 mg Other Once Mohammed Kindle, MD      . triamcinolone acetonide (KENALOG-40) injection 40 mg  40 mg Other Once Mohammed Kindle, MD      . triamcinolone acetonide (KENALOG-40) injection 40 mg  40 mg Other Once Mohammed Kindle, MD        Review of Systems:  GENERAL:  Feels "better".  No fevers or sweats.  Weight up 6 pounds. PERFORMANCE STATUS (ECOG):  1 HEENT:  No visual changes, runny nose, sore throat, mouth sores or tenderness. Lungs: No shortness of breath or cough.  No hemoptysis. Cardiac:  No chest pain, palpitations, orthopnea, or PND. GI:  No nausea, vomiting, diarrhea, constipation, melena or hematochezia. GU:  No urgency, frequency, dysuria, or hematuria. Musculoskeletal:  Hip pain (chronic).  Back pain.  Torn rotator cuff.  No muscle tenderness. Extremities:  No pain or swelling. Skin:  No rashes or skin changes. Neuro:  No headache, numbness or weakness, balance or coordination issues. Endocrine:  No diabetes, thyroid issues, hot flashes or night sweats. Psych:  No mood changes, anxiety or depression. Pain:  No pain. Review of systems:  All other systems reviewed and found to be negative.  Physical Exam: Blood pressure (!) 156/77, pulse 68, temperature (!) 96.7 F (35.9 C), temperature source Tympanic, weight 154 lb 1.6 oz (69.9 kg). GENERAL:  Well developed, well nourished, elderly woman sitting comfortably in the exam room in no acute distress.  MENTAL STATUS:  Alert and oriented to person, place and time. HEAD:  Wavy blonde hair.  Normocephalic, atraumatic, face symmetric, no Cushingoid features. EYES:  Brown eyes.  Pupils equal round and reactive to light and accomodation.  No conjunctivitis or scleral icterus. ENT:   Oropharynx clear without lesion.  Tongue normal. Mucous membranes dry.  RESPIRATORY:  Clear to auscultation without rales, wheezes or rhonchi. CARDIOVASCULAR:  Regular rate and rhythm without murmur, rub or gallop. ABDOMEN:  Soft, non-tender with active bowel sounds and no hepatosplenomegaly.  No masses. SKIN:  No rashes, ulcers or lesions. EXTREMITIES: Lower extremity chronic edema.  No skin discoloration or tenderness.  No palpable cords. LYMPH NODES: No palpable cervical, supraclavicular, axillary or inguinal adenopathy  NEUROLOGICAL: Unremarkable. PSYCH:  Appropriate.   Appointment on 12/26/2016  Component Date Value Ref Range Status  . WBC 12/26/2016 5.8  3.6 - 11.0 K/uL Final  . RBC 12/26/2016 4.25  3.80 - 5.20 MIL/uL Final  . Hemoglobin 12/26/2016 12.2  12.0 - 16.0 g/dL Final  . HCT 12/26/2016 37.3  35.0 - 47.0 % Final  . MCV 12/26/2016 87.9  80.0 - 100.0 fL Final  . MCH 12/26/2016 28.8  26.0 - 34.0 pg Final  . MCHC 12/26/2016 32.8  32.0 - 36.0 g/dL Final  . RDW 12/26/2016 15.0* 11.5 - 14.5 % Final  . Platelets 12/26/2016 282  150 - 440 K/uL Final  . Neutrophils Relative % 12/26/2016 60  % Final  . Neutro Abs 12/26/2016 3.4  1.4 - 6.5 K/uL Final  . Lymphocytes Relative 12/26/2016 21  % Final  . Lymphs Abs 12/26/2016 1.2  1.0 - 3.6 K/uL Final  . Monocytes Relative 12/26/2016 15  % Final  . Monocytes Absolute 12/26/2016 0.8  0.2 - 0.9 K/uL Final  . Eosinophils Relative 12/26/2016 4  % Final  . Eosinophils Absolute 12/26/2016 0.2  0 - 0.7 K/uL Final  . Basophils Relative 12/26/2016 2  % Final  . Basophils Absolute 12/26/2016 0.1  0 - 0.1 K/uL Final  . Sodium 12/26/2016 136  135 - 145 mmol/L Final  . Potassium 12/26/2016 3.8  3.5 - 5.1 mmol/L Final  . Chloride 12/26/2016 108  101 - 111 mmol/L Final  . CO2 12/26/2016 22  22 - 32 mmol/L Final  . Glucose, Bld 12/26/2016 84  65 - 99 mg/dL Final  . BUN 12/26/2016 18  6 - 20 mg/dL Final  . Creatinine, Ser 12/26/2016 0.80  0.44 -  1.00 mg/dL Final  . Calcium 12/26/2016 8.7* 8.9 - 10.3 mg/dL Final  . Total Protein 12/26/2016 6.8  6.5 - 8.1 g/dL Final  . Albumin 12/26/2016 3.9  3.5 - 5.0 g/dL Final  . AST 12/26/2016 18  15 - 41 U/L Final  . ALT 12/26/2016 15  14 - 54 U/L Final  . Alkaline Phosphatase 12/26/2016 48  38 - 126 U/L Final  . Total Bilirubin 12/26/2016 0.9  0.3 - 1.2 mg/dL Final  . GFR calc non Af Amer 12/26/2016 >60  >60 mL/min Final  . GFR calc Af Amer 12/26/2016 >60  >60 mL/min Final   Comment: (NOTE) The eGFR has been calculated using the CKD EPI equation. This calculation has not been validated in all clinical situations. eGFR's persistently <60 mL/min signify possible Chronic Kidney Disease.   . Anion gap 12/26/2016 6  5 - 15 Final    Assessment:  Marilyn Romero is a 83 y.o. female with a history of stage II sigmoid colon cancer.  She underwent hemicolectomy in 10/01/2011.  Pathology revealed a 3.5 cm moderately differentiated adenocarcinoma that invaded through the muscularis propria into the pericolic tissue.  There was lymphovascular and perineural invasion. Ten nodes were negative.  Pathologic stage was T3N0M0.    Chest, abdomen and pelvic CT scan on 06/07/2016 revealed no evidence of metastatic disease.  There was no evidence of local tumor recurrence status post subtotal distal colectomy.  There was mild distal colonic diverticulosis and evidence of constipation.  EGD on 07/02/2016 revealed a non-bleeding gastric ulcer with no stigmata of bleeding and gastritis.  Pathology was consistent with a healing ulcer.  There was no H pylori.  Colonoscopy on 07/02/2016 revealed diverticulosis in the entire examined colon with a patent end-to-end colo-colonic anastomos  CEA was 1.4 on 12/07/2011, 3.0 on 06/08/2015, 2.8 on 12/08/2015, and 2.9 on 06/06/2016.  She enrolled on NSABP P-5 which was a randomization between preventative daily rosuvastatin/Crestor versus placebo.  The study was eventualy closed  secondary to poor accrual.  She notes a history of a benign lung tumor removal  in 1974.  She denies any respiratory symptoms.  Symptomatically, she has epigastric discomfort and nausea.  She denies any melena or hematochezia.  Plan: 1.  Labs today:  CBC with diff, CMP, CEA. 2.  Review interval EGD and colonoscopy.  Colonoscopy was negative.  EGD revealed gastritis and a healing gastric ulcer. 3.  Recommend follow-up CEA yearly and regular colonoscopies per GI 4.  RTC prn.   Lequita Asal, MD  12/26/2016, 11:33 AM

## 2016-12-27 LAB — CEA: CEA: 2.8 ng/mL (ref 0.0–4.7)

## 2017-03-01 ENCOUNTER — Ambulatory Visit
Admission: RE | Admit: 2017-03-01 | Discharge: 2017-03-01 | Disposition: A | Discharge status: Home / Self Care | Payer: Medicare Other | Source: Ambulatory Visit | Attending: Pediatrics | Admitting: Pediatrics

## 2017-03-01 ENCOUNTER — Other Ambulatory Visit: Payer: Self-pay | Admitting: Pediatrics

## 2017-03-01 ENCOUNTER — Other Ambulatory Visit
Admission: RE | Admit: 2017-03-01 | Discharge: 2017-03-01 | Disposition: A | Discharge status: Home / Self Care | Payer: Medicare Other | Source: Ambulatory Visit | Attending: Pediatrics | Admitting: Pediatrics

## 2017-03-01 DIAGNOSIS — K5730 Diverticulosis of large intestine without perforation or abscess without bleeding: Secondary | ICD-10-CM | POA: Diagnosis not present

## 2017-03-01 DIAGNOSIS — I700 Atherosclerosis of aorta: Secondary | ICD-10-CM | POA: Insufficient documentation

## 2017-03-01 DIAGNOSIS — K639 Disease of intestine, unspecified: Secondary | ICD-10-CM | POA: Diagnosis present

## 2017-03-01 DIAGNOSIS — R1084 Generalized abdominal pain: Secondary | ICD-10-CM

## 2017-03-01 DIAGNOSIS — Z8719 Personal history of other diseases of the digestive system: Secondary | ICD-10-CM | POA: Insufficient documentation

## 2017-03-01 MED ORDER — IOPAMIDOL (ISOVUE-300) INJECTION 61%
100.0000 mL | Freq: Once | INTRAVENOUS | Status: AC | PRN
  Administered 2017-03-01: 100 mL via INTRAVENOUS

## 2017-03-07 ENCOUNTER — Other Ambulatory Visit: Payer: Self-pay | Admitting: Family Medicine

## 2017-03-07 DIAGNOSIS — G8929 Other chronic pain: Secondary | ICD-10-CM

## 2017-03-07 DIAGNOSIS — Z780 Asymptomatic menopausal state: Secondary | ICD-10-CM

## 2017-03-07 DIAGNOSIS — Z1231 Encounter for screening mammogram for malignant neoplasm of breast: Secondary | ICD-10-CM

## 2017-03-07 DIAGNOSIS — M545 Low back pain: Secondary | ICD-10-CM

## 2017-03-19 ENCOUNTER — Ambulatory Visit
Admission: RE | Admit: 2017-03-19 | Discharge: 2017-03-19 | Disposition: A | Discharge status: Home / Self Care | Payer: Medicare Other | Source: Ambulatory Visit | Attending: Family Medicine | Admitting: Family Medicine

## 2017-03-19 ENCOUNTER — Inpatient Hospital Stay: Admission: RE | Admit: 2017-03-19 | Payer: Medicare Other | Source: Ambulatory Visit

## 2017-03-19 DIAGNOSIS — Z1231 Encounter for screening mammogram for malignant neoplasm of breast: Secondary | ICD-10-CM | POA: Insufficient documentation

## 2017-04-08 ENCOUNTER — Other Ambulatory Visit: Payer: Medicare Other

## 2017-05-07 DIAGNOSIS — M12811 Other specific arthropathies, not elsewhere classified, right shoulder: Secondary | ICD-10-CM | POA: Insufficient documentation

## 2018-01-08 DIAGNOSIS — T84069A Wear of articular bearing surface of unspecified internal prosthetic joint, initial encounter: Secondary | ICD-10-CM | POA: Insufficient documentation

## 2018-01-08 DIAGNOSIS — M533 Sacrococcygeal disorders, not elsewhere classified: Secondary | ICD-10-CM | POA: Insufficient documentation

## 2018-02-12 DIAGNOSIS — R7989 Other specified abnormal findings of blood chemistry: Secondary | ICD-10-CM | POA: Insufficient documentation

## 2019-01-13 ENCOUNTER — Other Ambulatory Visit: Payer: Self-pay | Admitting: Family Medicine

## 2019-01-13 DIAGNOSIS — Z780 Asymptomatic menopausal state: Secondary | ICD-10-CM

## 2019-05-16 ENCOUNTER — Emergency Department: Payer: Medicare Other

## 2019-05-16 ENCOUNTER — Other Ambulatory Visit: Payer: Self-pay

## 2019-05-16 ENCOUNTER — Emergency Department
Admission: EM | Admit: 2019-05-16 | Discharge: 2019-05-16 | Disposition: A | Discharge status: Home / Self Care | Payer: Medicare Other | Attending: Emergency Medicine | Admitting: Emergency Medicine

## 2019-05-16 ENCOUNTER — Encounter: Payer: Self-pay | Admitting: Emergency Medicine

## 2019-05-16 DIAGNOSIS — R42 Dizziness and giddiness: Secondary | ICD-10-CM | POA: Diagnosis present

## 2019-05-16 DIAGNOSIS — Z85038 Personal history of other malignant neoplasm of large intestine: Secondary | ICD-10-CM | POA: Diagnosis not present

## 2019-05-16 DIAGNOSIS — Z96641 Presence of right artificial hip joint: Secondary | ICD-10-CM | POA: Insufficient documentation

## 2019-05-16 LAB — BASIC METABOLIC PANEL
Anion gap: 11 (ref 5–15)
BUN: 19 mg/dL (ref 8–23)
CO2: 21 mmol/L — ABNORMAL LOW (ref 22–32)
Calcium: 8.8 mg/dL — ABNORMAL LOW (ref 8.9–10.3)
Chloride: 108 mmol/L (ref 98–111)
Creatinine, Ser: 0.81 mg/dL (ref 0.44–1.00)
GFR calc Af Amer: >60 mL/min (ref >60)
GFR calc non Af Amer: >60 mL/min (ref >60)
Glucose, Bld: 99 mg/dL (ref 70–99)
Potassium: 3.7 mmol/L (ref 3.5–5.1)
Sodium: 140 mmol/L (ref 135–145)

## 2019-05-16 LAB — CBC
HCT: 41.1 % (ref 36.0–46.0)
Hemoglobin: 13 g/dL (ref 12.0–15.0)
MCH: 28.7 pg (ref 26.0–34.0)
MCHC: 31.6 g/dL (ref 30.0–36.0)
MCV: 90.7 fL (ref 80.0–100.0)
Platelets: 366 10*3/uL (ref 150–400)
RBC: 4.53 MIL/uL (ref 3.87–5.11)
RDW: 14.7 % (ref 11.5–15.5)
WBC: 5.2 10*3/uL (ref 4.0–10.5)
nRBC: 0 % (ref 0.0–0.2)

## 2019-05-16 MED ORDER — SODIUM CHLORIDE 0.9% FLUSH: 3.0000 mL | Freq: Once | INTRAVENOUS | Status: DC

## 2019-05-16 MED ORDER — MECLIZINE HCL 25 MG PO TABS: 25.0000 mg | ORAL_TABLET | Freq: Three times a day (TID) | ORAL | 0 refills | Status: DC | PRN

## 2019-05-16 NOTE — Discharge Instructions (Signed)
Please call Dr. Marella Bile, ear nose and throat, and follow-up with him.  If he cannot help you we will get you to follow-up with neurology either Dr. Manuella Ghazi or Dr. Melrose Nakayama.  Please return for worsening symptoms.  In the meantime I will have you take Antivert 1 pill 3 times a day to help prevent the vertigo or spinning.  Be careful of the Antivert is a type of antihistamine and it can make you a little woozy.  If you are woozy be careful not to fall and be not to drive if you feel that way.

## 2019-05-16 NOTE — ED Notes (Addendum)
Scharlene Hooke (daughter) updated about pt status and would like to be updated when her MRI is complete. Pt number updated in the chart.

## 2019-05-16 NOTE — ED Triage Notes (Signed)
Pt to ED via POV c/o dizziness x 6-8 weeks. Pt states that the dizziness has been coming and going, sometimes she would have nausea with the dizziness. Pt state that she has taken OTC motion sickness medication and it has helped. Pt states that she called her PCP yesterday to make an appt to go in and be seen, her PCP office told her that it sounds like she was having a stroke and she needed to come to the ED. Pt presents this morning c/o dizziness- no worse than it has been the past 6-8 weeks and some nausea. Pt is in NAD. No slurred speech is noted, no facial droop. Grips are equal bilaterally. Pt is A & O, answering questions appropriately and able to follow commands.

## 2019-05-16 NOTE — ED Notes (Signed)
Pt screen by MRI

## 2019-05-16 NOTE — ED Provider Notes (Signed)
[facility] Emergency Department Provider Note   ____________________________________________   First MD Initiated Contact with Patient 05/16/19 [phone]     (approximate)  I have reviewed the triage vital signs and the nursing notes.   HISTORY  Chief Complaint Dizziness    HPI Marilyn Romero is a 85 y.o. female who reports 6 to 8 weeks of intermittent dizziness.  Dizziness is made worse with head movement and results in nausea and vomiting and a spinning sensation.  It comes on without any provocation will last up to 2 hours and then go away spontaneously.  Patient can walk during it.  She does not have a headache or ringing in the ears or difficulty hearing.  There is no diarrhea associated with any of this.         Past Medical History:  Diagnosis Date  . Cancer of sigmoid colon ([place]) 12/08/2014  . Colonic mass 10/07/2012   Overview:  Gets routine colonoscopies. Dx was 2013.  . DDD (degenerative disc disease), lumbar 12/16/2014  . Diverticulitis 06/27/2011  . Facet syndrome, lumbar 12/16/2014  . GERD (gastroesophageal reflux disease) 05/26/2012  . Insomnia 06/18/2016   Last Assessment & Plan:  Relevant Hx: Course: Daily Update: Today's Plan:  . Lesion of bladder 04/29/2012  . Lumbar radiculopathy 10/04/2015  . Sacroiliac joint dysfunction of both sides 12/16/2014  . Status post total replacement of right hip 08/15/2015    Patient Active Problem List   Diagnosis Date Noted  . Personal history of colon cancer   . Abdominal pain, epigastric   . Acute peptic ulcer of stomach   . Gastritis without bleeding   . Insomnia 06/18/2016  . Greater trochanteric bursitis 10/04/2015  . Lumbar radiculopathy 10/04/2015  . Status post total replacement of right hip 08/15/2015  . Primary osteoarthritis of one hip 08/15/2015  . Pain due to total hip replacement ([place]) 08/15/2015  . DDD (degenerative disc disease), lumbar 12/16/2014  . Facet syndrome, lumbar 12/16/2014   . Sacroiliac joint dysfunction of both sides 12/16/2014  . Cancer of sigmoid colon ([place]) 12/08/2014  . Pure hypercholesterolemia 08/10/2013  . Colonic mass 10/07/2012  . GERD (gastroesophageal reflux disease) 05/26/2012  . Lesion of bladder 04/29/2012  . Atrophic vaginitis 04/29/2012  . Dysuria 04/17/2012  . Diverticulitis 06/27/2011    Past Surgical History:  Procedure Laterality Date  . COLON RESECTION    . COLONOSCOPY WITH PROPOFOL N/A 07/02/2016   Procedure: COLONOSCOPY WITH PROPOFOL;  Surgeon: Lucilla Lame, MD;  Location: [facility];  Service: Endoscopy;  Laterality: N/A;  . ESOPHAGOGASTRODUODENOSCOPY (EGD) WITH PROPOFOL N/A 07/02/2016   Procedure: ESOPHAGOGASTRODUODENOSCOPY (EGD) WITH PROPOFOL;  Surgeon: Lucilla Lame, MD;  Location: [facility];  Service: Endoscopy;  Laterality: N/A;  . HIP SURGERY Right   . LUNG SURGERY     non-malignant mass on lung    Prior to Admission medications   Medication Sig Start Date End Date Taking? Authorizing Provider  acetaminophen (TYLENOL) 325 MG tablet Take 650 mg by mouth every 6 (six) hours as needed.    [provider]  citalopram (CELEXA) 10 MG tablet Take 10 mg by mouth daily.    [provider]  escitalopram (LEXAPRO) 10 MG tablet Take 10 mg by mouth at bedtime.    [provider]  ibuprofen (ADVIL,MOTRIN) 200 MG tablet Take 200 mg by mouth every 6 (six) hours as needed. Reported on 10/31/2015    [provider]  meclizine (ANTIVERT) 25 MG tablet Take  1 tablet (25 mg total) by mouth 3 (three) times daily as needed for dizziness. 05/16/19   Nena Polio, MD  meloxicam (MOBIC) 7.5 MG tablet Take 7.5 mg by mouth daily. 12/11/16   [provider]  pantoprazole (PROTONIX) 40 MG tablet Take 1 tablet (40 mg total) by mouth daily. 07/02/16   Lucilla Lame, MD  traMADol (ULTRAM) 50 MG tablet Take 50 mg by mouth every 4 (four) hours as needed.    [provider]    Allergies  Patient has no known allergies.  Family History  Problem Relation Age of Onset  . Early death Mother   . Arthritis Father   . Hypertension Father   . Breast cancer Other     Social History Social History   Tobacco Use  . Smoking status: Never Smoker  . Smokeless tobacco: Never Used  Substance Use Topics  . Alcohol use: No    Alcohol/week: 0.0 standard drinks  . Drug use: No    Review of Systems  Constitutional: No fever/chills Eyes: No visual changes. ENT: No sore throat. Cardiovascular: Denies chest pain. Respiratory: Denies shortness of breath. Gastrointestinal: No abdominal pain.  No nausea, no vomiting.  No diarrhea.  No constipation. Genitourinary: Negative for dysuria. Musculoskeletal: Negative for back pain. Skin: Negative for rash. Neurological: Negative for headaches, focal weakness ____________________________________________   PHYSICAL EXAM:  VITAL SIGNS: ED Triage Vitals  Enc Vitals Group     BP 05/16/19 0943 (!) 141/73     Pulse Rate 05/16/19 0943 80     Resp 05/16/19 0943 18     Temp 05/16/19 0943 97.9 F (36.6 C)     Temp Source 05/16/19 0943 Oral     SpO2 05/16/19 0943 98 %     Weight 05/16/19 0943 150 lb (68 kg)     Height 05/16/19 0943 5\' 5"  (1.651 m)     Head Circumference --      Peak Flow --      Pain Score 05/16/19 0951 0     Pain Loc --      Pain Edu? --      Excl. in [place]? --     Constitutional: Alert and oriented. Well appearing and in no acute distress. Eyes: Conjunctivae are normal. PERRL. EOMI. fundi appear normal Head: Atraumatic. Ears: TMs mostly obscured by wax but what I can see looks normal. Nose: No congestion/rhinnorhea. Mouth/Throat: Mucous membranes are moist.  Oropharynx non-erythematous. Neck: No stridor.   Cardiovascular: Normal rate, regular rhythm. Grossly normal heart sounds.  Good peripheral circulation. Respiratory: Normal respiratory effort.  No retractions. Lungs CTAB. Gastrointestinal: Soft and  nontender. No distention. No abdominal bruits. No CVA tenderness. Musculoskeletal: No lower extremity tenderness nor edema.   Neurologic:  Normal speech and language. No gross focal neurologic deficits are appreciated.  Patient reports gait instability especially if carrying something but cranial nerves II through XII are intact although visual fields were not checked.  Cerebellar finger-to-nose and rapid alternating movements in the hands are normal.  Motor strength is 5/5 throughout.  Patient does not report any numbness. Skin:  Skin is warm, dry and intact. No rash noted. Psychiatric: Mood and affect are normal. Speech and behavior are normal.  ____________________________________________   LABS (all labs ordered are listed, but only abnormal results are displayed)  Labs Reviewed  BASIC METABOLIC PANEL - Abnormal; Notable for the following components:      Result Value   CO2 21 (*)    Calcium 8.8 (*)  All other components within normal limits  CBC   ____________________________________________  EKG  EKG read interpreted by me shows normal sinus rhythm rate of 80 normal axis no acute ST-T changes ____________________________________________  RADIOLOGY  ED MD interpretation: MRI read by radiology as no acute disease.  I reviewed the films.  Official radiology report(s): No results found.  ____________________________________________   PROCEDURES  Procedure(s) performed (including Critical Care):  Procedures   ____________________________________________   INITIAL IMPRESSION / ASSESSMENT AND PLAN / ED COURSE  Patient doing very well on discharge.  MRI was negative.  We will let her go.              ____________________________________________   FINAL CLINICAL IMPRESSION(S) / ED DIAGNOSES  Final diagnoses:  Vertigo     ED Discharge Orders         Ordered    meclizine (ANTIVERT) 25 MG tablet  3 times daily PRN     05/16/19 1626            Note:  This document was prepared using Dragon voice recognition software and may include unintentional dictation errors.    Nena Polio, MD 05/17/19 1550

## 2019-05-16 NOTE — ED Notes (Signed)
Patient transported to MRI 

## 2019-05-16 NOTE — ED Notes (Signed)
Pt understands we need urine sample. Unable to get sample at this time. Instructed pt to press call bell when able to use the bathroom. Call light within reach.

## 2019-06-23 DIAGNOSIS — D72829 Elevated white blood cell count, unspecified: Secondary | ICD-10-CM | POA: Insufficient documentation

## 2019-06-23 DIAGNOSIS — Z90710 Acquired absence of both cervix and uterus: Secondary | ICD-10-CM | POA: Insufficient documentation

## 2019-10-07 ENCOUNTER — Other Ambulatory Visit: Payer: Self-pay | Admitting: Orthopedic Surgery

## 2019-10-07 DIAGNOSIS — G8929 Other chronic pain: Secondary | ICD-10-CM

## 2019-10-07 DIAGNOSIS — M25551 Pain in right hip: Secondary | ICD-10-CM

## 2019-10-09 ENCOUNTER — Ambulatory Visit
Admission: RE | Admit: 2019-10-09 | Discharge: 2019-10-09 | Disposition: A | Discharge status: Home / Self Care | Payer: Medicare Other | Source: Ambulatory Visit | Attending: Orthopedic Surgery | Admitting: Orthopedic Surgery

## 2019-10-09 ENCOUNTER — Other Ambulatory Visit: Payer: Self-pay

## 2019-10-09 ENCOUNTER — Other Ambulatory Visit: Payer: Medicare Other

## 2019-10-09 ENCOUNTER — Ambulatory Visit: Payer: Medicare Other

## 2019-10-09 DIAGNOSIS — M25551 Pain in right hip: Secondary | ICD-10-CM | POA: Insufficient documentation

## 2019-10-09 DIAGNOSIS — G8929 Other chronic pain: Secondary | ICD-10-CM | POA: Insufficient documentation

## 2019-10-09 MED ORDER — TECHNETIUM TC 99M MEDRONATE IV KIT
20.0000 | PACK | Freq: Once | INTRAVENOUS | Status: AC | PRN
  Administered 2019-10-09: 22.581 via INTRAVENOUS

## 2020-12-19 ENCOUNTER — Other Ambulatory Visit: Payer: Self-pay | Admitting: Family Medicine

## 2020-12-19 DIAGNOSIS — Z1231 Encounter for screening mammogram for malignant neoplasm of breast: Secondary | ICD-10-CM

## 2020-12-19 DIAGNOSIS — Z780 Asymptomatic menopausal state: Secondary | ICD-10-CM

## 2021-01-10 ENCOUNTER — Other Ambulatory Visit: Payer: Self-pay

## 2021-01-10 ENCOUNTER — Encounter: Payer: Self-pay | Admitting: Student in an Organized Health Care Education/Training Program

## 2021-01-10 ENCOUNTER — Ambulatory Visit
Payer: Medicare Other | Attending: Student in an Organized Health Care Education/Training Program | Admitting: Student in an Organized Health Care Education/Training Program

## 2021-01-10 VITALS — BP 161/83 | HR 80 | Temp 96.6°F | Resp 14 | Ht 65.0 in | Wt 145.0 lb

## 2021-01-10 DIAGNOSIS — G894 Chronic pain syndrome: Secondary | ICD-10-CM | POA: Insufficient documentation

## 2021-01-10 DIAGNOSIS — Z96649 Presence of unspecified artificial hip joint: Secondary | ICD-10-CM | POA: Insufficient documentation

## 2021-01-10 DIAGNOSIS — T8484XS Pain due to internal orthopedic prosthetic devices, implants and grafts, sequela: Secondary | ICD-10-CM

## 2021-01-10 DIAGNOSIS — M47816 Spondylosis without myelopathy or radiculopathy, lumbar region: Secondary | ICD-10-CM | POA: Diagnosis not present

## 2021-01-10 DIAGNOSIS — M533 Sacrococcygeal disorders, not elsewhere classified: Secondary | ICD-10-CM | POA: Diagnosis not present

## 2021-01-10 DIAGNOSIS — M5416 Radiculopathy, lumbar region: Secondary | ICD-10-CM | POA: Diagnosis not present

## 2021-01-10 NOTE — Progress Notes (Signed)
Safety precautions to be maintained throughout the outpatient stay will include: orient to surroundings, keep bed in low position, maintain call bell within reach at all times, provide assistance with transfer out of bed and ambulation.  

## 2021-01-10 NOTE — Progress Notes (Signed)
Patient: Marilyn Romero  Service Category: E/M  Provider: Gillis Santa, MD  DOB: 1933-12-06  DOS: 01/10/2021  Referring Provider: Hessie Knows, MD  MRN: [id number]  Setting: Ambulatory outpatient  PCP: Gayland Curry, MD  Type: New Patient  Specialty: Interventional Pain Management    Location: Office  Delivery: Face-to-face     Primary Reason(s) for Visit: Encounter for initial evaluation of one or more chronic problems (new to examiner) potentially causing chronic pain, and posing a threat to normal musculoskeletal function. (Level of risk: High) CC: Hip Pain (right)  HPI  Marilyn Romero is a 87 y.o. year old, female patient, who comes for the first time to our practice referred by Hessie Knows, MD for our initial evaluation of her chronic pain. She has Cancer of sigmoid colon ([place]); DDD (degenerative disc disease), lumbar; Facet syndrome, lumbar; Sacroiliac joint dysfunction of both sides; Greater trochanteric bursitis; Lumbar radiculopathy; Status post total replacement of right hip; Pure hypercholesterolemia; Primary osteoarthritis of one hip; Pain due to total hip replacement ([place]); Lesion of bladder; Insomnia; GERD (gastroesophageal reflux disease); Dysuria; Diverticulitis; Colonic mass; Atrophic vaginitis; Personal history of colon cancer; Abdominal pain, epigastric; Acute peptic ulcer of stomach; Gastritis without bleeding; and Chronic pain syndrome on their problem list. Today she comes in for evaluation of her Hip Pain (right)  Pain Assessment: Location: Right Hip Radiating: right groin, right upper leg Onset: More than a month ago Duration: Chronic pain Quality: Constant, Other (Comment) (steady) Severity: 7 /10 (subjective, self-reported pain score)  Effect on ADL: difficulty performing daily activities Timing: Constant Modifying factors: Tramadol BP: (!) 161/83  HR: 80  Onset and Duration: Present longer than 3 months Cause of pain: Unknown Severity: Getting worse, NAS-11 at its  best: 8/10, and NAS-11 now: 8/10 Timing: Not influenced by the time of the day, During activity or exercise, and After activity or exercise Aggravating Factors: Climbing, Kneeling, Lifiting, Prolonged standing, Squatting, and Stooping  Alleviating Factors: Cold packs, Lying down, Resting, and Warm showers or baths Associated Problems: Constipation, Fatigue, Temperature changes, and Pain that wakes patient up Quality of Pain: Aching, Dull, Fearful, Horrible, Sharp, and Throbbing Previous Examinations or Tests: CT scan and Orthopedic evaluation Previous Treatments: Narcotic medications  Marilyn Romero is a pleasant 87 year old female who presents with a chief complaint of right hip pain.  She is status post right hip replacement surgery with subsequent revision.  Her initial surgery was in [place] and her revision was done a couple of years later at [facility].  She has tried steroid injections in her right hip.  She endorses a knot in her right hip and groin region that is painful when she is going from supine to sitting and standing.  She is on tramadol 50 mg twice daily which has been managed by orthopedics.  She is being referred here for chronic pain management, primarily management of her tramadol.  She is accompanied today by her daughter.  She has done physical therapy in the past.  Historic Controlled Substance Pharmacotherapy Review  PMP and historical list of controlled substances: Tramadol 50 mg BID Current opioid analgesics: Tramadol 50 mg BID  Historical Monitoring: The patient  reports no history of drug use. List of all UDS Test(s): No results found for: MDMA, COCAINSCRNUR, [place], [place], CANNABQUANT, [place], [place] List of other Serum/Urine Drug Screening Test(s):  No results found for: AMPHSCRSER, BARBSCRSER, BENZOSCRSER, COCAINSCRSER, COCAINSCRNUR, PCPSCRSER, PCPQUANT, THCSCRSER, THCU, CANNABQUANT, OPIATESCRSER, OXYSCRSER, PROPOXSCRSER, ETH Historical Background Evaluation: [place] PMP: PDMP  not reviewed this  encounter. Online review of the past 24-monthperiod conducted.              [place] Department of public safety, offender search: (Editor, commissioningInformation) Non-contributory Risk Assessment Profile: Aberrant behavior: None observed or detected today Risk factors for fatal opioid overdose: None identified today  Fatal overdose hazard ratio (HR): Calculation deferred Non-fatal overdose hazard ratio (HR): Calculation deferred Risk of opioid abuse or dependence: 0.7-3.0% with doses ? 36 MME/day and 6.1-26% with doses ? 120 MME/day. Substance use disorder (SUD) risk level: See below Personal History of Substance Abuse (SUD-Substance use disorder):  Alcohol: Negative  Illegal Drugs: Negative  Rx Drugs: Negative  ORT Risk Level calculation: Low Risk  Opioid Risk Tool - 01/10/21 1002       Family History of Substance Abuse   Alcohol Negative    Illegal Drugs Negative    Rx Drugs Negative      Personal History of Substance Abuse   Alcohol Negative    Illegal Drugs Negative    Rx Drugs Negative      Age   Age between 154-45years  No      History of Preadolescent Sexual Abuse   History of Preadolescent Sexual Abuse Negative or Female      Psychological Disease   Psychological Disease Negative    Depression Negative      Total Score   Opioid Risk Tool Scoring 0    Opioid Risk Interpretation Low Risk            ORT Scoring interpretation table:  Score <3 = Low Risk for SUD  Score between 4-7 = Moderate Risk for SUD  Score >8 = High Risk for Opioid Abuse   PHQ-2 Depression Scale:  Total score: 0  PHQ-2 Scoring interpretation table: (Score and probability of major depressive disorder)  Score 0 = No depression  Score 1 = 15.4% Probability  Score 2 = 21.1% Probability  Score 3 = 38.4% Probability  Score 4 = 45.5% Probability  Score 5 = 56.4% Probability  Score 6 = 78.6% Probability   PHQ-9 Depression Scale:  Total score: 0  PHQ-9 Scoring interpretation table:   Score 0-4 = No depression  Score 5-9 = Mild depression  Score 10-14 = Moderate depression  Score 15-19 = Moderately severe depression  Score 20-27 = Severe depression (2.4 times higher risk of SUD and 2.89 times higher risk of overuse)   Pharmacologic Plan: As per protocol, I have not taken over any controlled substance management, pending the results of ordered tests and/or consults.            Initial impression: Pending review of available data and ordered tests.  Meds   Current Outpatient Medications:    atorvastatin (LIPITOR) 20 MG tablet, Take 20 mg by mouth daily., Disp: , Rfl:    ibuprofen (ADVIL,MOTRIN) 200 MG tablet, Take 200 mg by mouth every 6 (six) hours as needed. Reported on 10/31/2015, Disp: , Rfl:    traMADol (ULTRAM) 50 MG tablet, Take 50 mg by mouth every 4 (four) hours as needed., Disp: , Rfl:   Current Facility-Administered Medications:    bupivacaine (PF) (MARCAINE) 0.25 % injection 30 mL, 30 mL, Other, Once, CMohammed Kindle MD   bupivacaine (PF) (MARCAINE) 0.25 % injection 30 mL, 30 mL, Other, Once, CMohammed Kindle MD   ceFAZolin (ANCEF) IVPB 1 g/50 mL premix, 1 g, Intravenous, Once, CMohammed Kindle MD   fentaNYL (SUBLIMAZE) injection 100 mcg, 100 mcg, Intravenous, Once, CMohammed Kindle  MD   lactated ringers infusion 1,000 mL, 1,000 mL, Intravenous, Continuous, Mohammed Kindle, MD   midazolam (VERSED) 5 MG/5ML injection 5 mg, 5 mg, Intravenous, Once, Mohammed Kindle, MD   orphenadrine (NORFLEX) injection 60 mg, 60 mg, Intramuscular, Once, Mohammed Kindle, MD   sodium chloride flush (NS) 0.9 % injection 20 mL, 20 mL, Other, Once, Mohammed Kindle, MD   triamcinolone acetonide (KENALOG-40) injection 40 mg, 40 mg, Other, Once, Mohammed Kindle, MD   triamcinolone acetonide (KENALOG-40) injection 40 mg, 40 mg, Other, Once, Mohammed Kindle, MD   triamcinolone acetonide (KENALOG-40) injection 40 mg, 40 mg, Other, Once, Mohammed Kindle, MD   ROS  Cardiovascular: No reported  cardiovascular signs or symptoms such as High blood pressure, coronary artery disease, abnormal heart rate or rhythm, heart attack, blood thinner therapy or heart weakness and/or failure Pulmonary or Respiratory: Snoring  Neurological: No reported neurological signs or symptoms such as seizures, abnormal skin sensations, urinary and/or fecal incontinence, being born with an abnormal open spine and/or a tethered spinal cord Psychological-Psychiatric: Prone to panicking Gastrointestinal: Irregular, infrequent bowel movements (Constipation) Genitourinary: No reported renal or genitourinary signs or symptoms such as difficulty voiding or producing urine, peeing blood, non-functioning kidney, kidney stones, difficulty emptying the bladder, difficulty controlling the flow of urine, or chronic kidney disease Hematological: No reported hematological signs or symptoms such as prolonged bleeding, low or poor functioning platelets, bruising or bleeding easily, hereditary bleeding problems, low energy levels due to low hemoglobin or being anemic Endocrine: No reported endocrine signs or symptoms such as high or low blood sugar, rapid heart rate due to high thyroid levels, obesity or weight gain due to slow thyroid or thyroid disease Rheumatologic: No reported rheumatological signs and symptoms such as fatigue, joint pain, tenderness, swelling, redness, heat, stiffness, decreased range of motion, with or without associated rash Musculoskeletal: Negative for myasthenia gravis, muscular dystrophy, multiple sclerosis or malignant hyperthermia Work History: Retired  Allergies  Marilyn Romero has No Known Allergies.  Laboratory Chemistry Profile   Renal Lab Results  Component Value Date   BUN 19 05/16/2019   CREATININE 0.81 05/16/2019   GFRAA >60 05/16/2019   GFRNONAA >60 05/16/2019   PROTEINUR Negative 10/10/2011     Electrolytes Lab Results  Component Value Date   NA 140 05/16/2019   K 3.7 05/16/2019   CL  108 05/16/2019   CALCIUM 8.8 (L) 05/16/2019     Hepatic Lab Results  Component Value Date   AST 18 12/26/2016   ALT 15 12/26/2016   ALBUMIN 3.9 12/26/2016   ALKPHOS 48 12/26/2016     ID No results found for: LYMEIGGIGMAB, HIV, SARSCOV2NAA, STAPHAUREUS, MRSAPCR, HCVAB, PREGTESTUR, RMSFIGG, QFVRPH1IGG, QFVRPH2IGG, LYMEIGGIGMAB   Bone No results found for: VD25OH, IO035DH7CBU, G2877219, [id number], 25OHVITD1, 25OHVITD2, 25OHVITD3, TESTOFREE, TESTOSTERONE   Endocrine Lab Results  Component Value Date   GLUCOSE 99 05/16/2019   GLUCOSEU Negative 10/10/2011     Neuropathy No results found for: VITAMINB12, FOLATE, HGBA1C, HIV   CNS No results found for: COLORCSF, APPEARCSF, RBCCOUNTCSF, WBCCSF, POLYSCSF, LYMPHSCSF, EOSCSF, PROTEINCSF, GLUCCSF, JCVIRUS, CSFOLI, IGGCSF, LABACHR, ACETBL, LABACHR, ACETBL   Inflammation (CRP: Acute  ESR: Chronic) No results found for: CRP, ESRSEDRATE, LATICACIDVEN   Rheumatology No results found for: RF, ANA, LABURIC, URICUR, LYMEIGGIGMAB, LYMEABIGMQN, HLAB27   Coagulation Lab Results  Component Value Date   INR 0.9 10/10/2011   LABPROT 12.0 10/10/2011   PLT 366 05/16/2019     Cardiovascular Lab Results  Component Value Date   HGB 13.0  05/16/2019   HCT 41.1 05/16/2019     Screening No results found for: SARSCOV2NAA, COVIDSOURCE, STAPHAUREUS, MRSAPCR, HCVAB, HIV, PREGTESTUR   Cancer Lab Results  Component Value Date   CEA 2.8 12/26/2016     Allergens No results found for: ALMOND, APPLE, ASPARAGUS, AVOCADO, BANANA, BARLEY, BASIL, BAYLEAF, GREENBEAN, LIMABEAN, WHITEBEAN, BEEFIGE, REDBEET, BLUEBERRY, BROCCOLI, CABBAGE, MELON, CARROT, CASEIN, CASHEWNUT, CAULIFLOWER, CELERY     Note: Lab results reviewed.  PFSH  Drug: Marilyn Romero  reports no history of drug use. Alcohol:  reports no history of alcohol use. Tobacco:  reports that she has never smoked. She has never used smokeless tobacco. Medical:  has a past medical history of Cancer of  sigmoid colon ([place]) (12/08/2014), Colonic mass (10/07/2012), DDD (degenerative disc disease), lumbar (12/16/2014), Diverticulitis (06/27/2011), Facet syndrome, lumbar (12/16/2014), GERD (gastroesophageal reflux disease) (05/26/2012), Insomnia (06/18/2016), Lesion of bladder (04/29/2012), Lumbar radiculopathy (10/04/2015), Sacroiliac joint dysfunction of both sides (12/16/2014), and Status post total replacement of right hip (08/15/2015). Family: family history includes Arthritis in her father; Breast cancer in an other family member; Early death in her mother; Hypertension in her father.  Past Surgical History:  Procedure Laterality Date   COLON RESECTION     COLONOSCOPY WITH PROPOFOL N/A 07/02/2016   Procedure: COLONOSCOPY WITH PROPOFOL;  Surgeon: Lucilla Lame, MD;  Location: [facility];  Service: Endoscopy;  Laterality: N/A;   ESOPHAGOGASTRODUODENOSCOPY (EGD) WITH PROPOFOL N/A 07/02/2016   Procedure: ESOPHAGOGASTRODUODENOSCOPY (EGD) WITH PROPOFOL;  Surgeon: Lucilla Lame, MD;  Location: [facility];  Service: Endoscopy;  Laterality: N/A;   HIP SURGERY Right    LUNG SURGERY     non-malignant mass on lung   Active Ambulatory Problems    Diagnosis Date Noted   Cancer of sigmoid colon ([place]) 12/08/2014   DDD (degenerative disc disease), lumbar 12/16/2014   Facet syndrome, lumbar 12/16/2014   Sacroiliac joint dysfunction of both sides 12/16/2014   Greater trochanteric bursitis 10/04/2015   Lumbar radiculopathy 10/04/2015   Status post total replacement of right hip 08/15/2015   Pure hypercholesterolemia 08/10/2013   Primary osteoarthritis of one hip 08/15/2015   Pain due to total hip replacement ([place]) 08/15/2015   Lesion of bladder 04/29/2012   Insomnia 06/18/2016   GERD (gastroesophageal reflux disease) 05/26/2012   Dysuria 04/17/2012   Diverticulitis 06/27/2011   Colonic mass 10/07/2012   Atrophic vaginitis 04/29/2012   Personal history of colon cancer    Abdominal pain, epigastric     Acute peptic ulcer of stomach    Gastritis without bleeding    Chronic pain syndrome 01/10/2021   Resolved Ambulatory Problems    Diagnosis Date Noted   No Resolved Ambulatory Problems   No Additional Past Medical History   Constitutional Exam  General appearance: Well nourished, well developed, and well hydrated. In no apparent acute distress Vitals:   01/10/21 0955  BP: (!) 161/83  Pulse: 80  Resp: 14  Temp: (!) 96.6 F (35.9 C)  TempSrc: Temporal  SpO2: 98%  Weight: 145 lb (65.8 kg)  Height: _0  (1.651 m)   BMI Assessment: Estimated body mass index is 24.13 kg/m as calculated from the following:   Height as of this encounter: _1  (1.651 m).   Weight as of this encounter: 145 lb (65.8 kg).  BMI interpretation table: BMI level Category Range association with higher incidence of chronic pain  <18 kg/m2 Underweight   18.5-24.9 kg/m2 Ideal body weight   25-29.9 kg/m2 Overweight Increased incidence by 20%  30-34.9  kg/m2 Obese (Class I) Increased incidence by 68%  35-39.9 kg/m2 Severe obesity (Class II) Increased incidence by 136%  >40 kg/m2 Extreme obesity (Class III) Increased incidence by 254%   Patient's current BMI Ideal Body weight  Body mass index is 24.13 kg/m. Ideal body weight: 57 kg (125 lb 10.6 oz) Adjusted ideal body weight: 60.5 kg (133 lb 6.4 oz)   BMI Readings from Last 4 Encounters:  01/10/21 24.13 kg/m  05/16/19 24.96 kg/m  12/26/16 25.64 kg/m  07/02/16 24.46 kg/m   Wt Readings from Last 4 Encounters:  01/10/21 145 lb (65.8 kg)  05/16/19 150 lb (68 kg)  12/26/16 154 lb 1.6 oz (69.9 kg)  07/02/16 147 lb (66.7 kg)    Psych/Mental status: Alert, oriented x 3 (person, place, & time)       Eyes: PERLA Respiratory: No evidence of acute respiratory distress  Lumbar Spine Area Exam  Skin & Axial Inspection: No masses, redness, or swelling Alignment: Symmetrical Functional ROM: Pain restricted ROM       Stability: No instability  detected Muscle Tone/Strength: Functionally intact. No obvious neuro-muscular anomalies detected. Sensory (Neurological): Musculoskeletal pain pattern  Ambulation: Limited Gait: Antalgic Posture: WNL  Lower Extremity Exam    Side: Right lower extremity  Side: Left lower extremity  Stability: No instability observed          Stability: No instability observed          Skin & Extremity Inspection: Evidence of prior arthroplastic surgery  Skin & Extremity Inspection: Skin color, temperature, and hair growth are WNL. No peripheral edema or cyanosis. No masses, redness, swelling, asymmetry, or associated skin lesions. No contractures.  Functional ROM: Pain restricted ROM hip                   Functional ROM: Unrestricted ROM                  Muscle Tone/Strength: Functionally intact. No obvious neuro-muscular anomalies detected.  Muscle Tone/Strength: Functionally intact. No obvious neuro-muscular anomalies detected.  Sensory (Neurological): Musculoskeletal pain pattern        Sensory (Neurological): Unimpaired        DTR: Patellar: deferred today Achilles: deferred today Plantar: deferred today  DTR: Patellar: deferred today Achilles: deferred today Plantar: deferred today  Palpation: No palpable anomalies  Palpation: No palpable anomalies    Assessment  Primary Diagnosis & Pertinent Problem List: The primary encounter diagnosis was Pain due to total hip replacement, sequela. Diagnoses of Sacroiliac joint dysfunction of both sides, Lumbar radiculopathy, Facet syndrome, lumbar, and Chronic pain syndrome were also pertinent to this visit.  Visit Diagnosis (New problems to examiner): 1. 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 Sacroiliac joint dysfunction of both sides   3. Lumbar radiculopathy   4. Facet syndrome, lumbar   5. Chronic pain syndrome    Plan of Care (Initial workup plan)  Note: Marilyn Romero was reminded that as per protocol, today's visit has been an evaluation only.  We have not taken over the patient's controlled substance management.   As is customary for new patients, we will want a baseline urine toxicology screen.  I will defer a psychiatric assessment for his [place] she seems low risk for substance use disorder.  She does find benefit with tramadol but states that she can also utilize nitro tablets or days.  When she follows up in approximately 7 to 10 days for her second patient visit, so long as her  urine toxicology screen is appropriate, I will sign pain contract and take over her tramadol which we can increase to quantity 75/month to that she can take an extra 1/2 tablet on days that are difficult for her.  Lab Orders  Compliance Drug Analysis, Ur    Provider-requested follow-up: Return in about 2 weeks (around 01/24/2021) for Medication Management, in person.  Future Appointments  Date Time Provider [place]  01/23/2021  1:00 PM Gillis Santa, MD [facility] None    Note by: Gillis Santa, MD Date: 01/10/2021; Time: 11:03 AM

## 2021-01-16 ENCOUNTER — Telehealth: Payer: Self-pay

## 2021-01-16 NOTE — Telephone Encounter (Signed)
Explained to patient that Dr Holley Raring had not taken over the pain medication at this point and they would discuss it at the next visit. Patient states understanding.

## 2021-01-16 NOTE — Telephone Encounter (Signed)
Pt is having trouble getting her medications. And in not sure what to do says she is in a lot of pain.

## 2021-01-18 LAB — COMPLIANCE DRUG ANALYSIS, UR

## 2021-01-23 ENCOUNTER — Ambulatory Visit
Payer: Medicare Other | Attending: Student in an Organized Health Care Education/Training Program | Admitting: Student in an Organized Health Care Education/Training Program

## 2021-01-23 ENCOUNTER — Encounter: Payer: Self-pay | Admitting: Student in an Organized Health Care Education/Training Program

## 2021-01-23 ENCOUNTER — Other Ambulatory Visit: Payer: Self-pay

## 2021-01-23 VITALS — BP 165/79 | HR 75 | Temp 97.4°F | Resp 16 | Ht 65.0 in | Wt 145.0 lb

## 2021-01-23 DIAGNOSIS — Z0289 Encounter for other administrative examinations: Secondary | ICD-10-CM | POA: Diagnosis not present

## 2021-01-23 DIAGNOSIS — G894 Chronic pain syndrome: Secondary | ICD-10-CM | POA: Diagnosis not present

## 2021-01-23 DIAGNOSIS — Z96649 Presence of unspecified artificial hip joint: Secondary | ICD-10-CM | POA: Insufficient documentation

## 2021-01-23 DIAGNOSIS — M533 Sacrococcygeal disorders, not elsewhere classified: Secondary | ICD-10-CM | POA: Diagnosis not present

## 2021-01-23 DIAGNOSIS — M47816 Spondylosis without myelopathy or radiculopathy, lumbar region: Secondary | ICD-10-CM | POA: Insufficient documentation

## 2021-01-23 DIAGNOSIS — T8484XS Pain due to internal orthopedic prosthetic devices, implants and grafts, sequela: Secondary | ICD-10-CM | POA: Insufficient documentation

## 2021-01-23 DIAGNOSIS — M5416 Radiculopathy, lumbar region: Secondary | ICD-10-CM | POA: Diagnosis present

## 2021-01-23 MED ORDER — TRAMADOL HCL 50 MG PO TABS: 50.0000 mg | ORAL_TABLET | Freq: Three times a day (TID) | ORAL | 1 refills | Status: AC | PRN

## 2021-01-23 NOTE — Progress Notes (Signed)
PROVIDER NOTE: Information contained herein reflects review and annotations entered in association with encounter. Interpretation of such information and data should be left to medically-trained personnel. Information provided to patient can be located elsewhere in the medical record under "Patient Instructions". Document created using STT-dictation technology, any transcriptional errors that may result from process are unintentional.    Patient: Marilyn Romero  Service Category: E/M  Provider: Gillis Santa, MD  DOB: Dec 30, 1933  DOS: 01/23/2021  Specialty: Interventional Pain Management  MRN: [id number]  Setting: Ambulatory outpatient  PCP: Gayland Curry, MD  Type: Established Patient    Referring Provider: Gayland Curry, MD  Location: Office  Delivery: Face-to-face     Primary Reason(s) for Visit: Encounter for evaluation before starting new chronic pain management plan of care (Level of risk: moderate) CC: Hip Pain (Right )  HPI  [name] is a 87 y.o. year old, female patient, who comes today for a follow-up evaluation to review the test results and decide on a treatment plan. She has Cancer of sigmoid colon ([place]); DDD (degenerative disc disease), lumbar; Facet syndrome, lumbar; Sacroiliac joint dysfunction of both sides; Greater trochanteric bursitis; Lumbar radiculopathy; Status post total replacement of right hip; Pure hypercholesterolemia; Primary osteoarthritis of one hip; Pain due to total hip replacement ([place]); Lesion of bladder; Insomnia; GERD (gastroesophageal reflux disease); Dysuria; Diverticulitis; Colonic mass; Atrophic vaginitis; Personal history of colon cancer; Abdominal pain, epigastric; Acute peptic ulcer of stomach; Gastritis without bleeding; and Chronic pain syndrome on their problem list. Her primarily concern today is the Hip Pain (Right )  Pain Assessment: Location: Right Hip Radiating: traveling around to the groin and going down leg on the right side. Onset: More  than a month ago Duration: Chronic pain Quality: Discomfort, Constant, Nagging Severity: 8 /10 (subjective, self-reported pain score)  Effect on ADL: pain affects her gait causing her to limp Timing: Constant Modifying factors: ice, tramadol was helping but she has not been able to get that filled at her drugstore. BP: (!) 165/79  HR: 75  Ms. Chew comes in today for a follow-up visit after her initial evaluation on 6/142022. Today we went over the results of her tests. These were explained in "Layman's terms". During today's appointment we went over my diagnostic impression, as well as the proposed treatment plan.   HPI from initial  visit 01/10/2021  Marilyn Romero is a pleasant 87 year old female who presents with a chief complaint of right hip pain.  She is status post right hip replacement surgery with subsequent revision.  Her initial surgery was in [place] and her revision was done a couple of years later at [facility].  She has tried steroid injections in her right hip.  She endorses a knot in her right hip and groin region that is painful when she is going from supine to sitting and standing.  She is on tramadol 50 mg twice daily which has been managed by orthopedics.  She is being referred here for chronic pain management, primarily management of her tramadol.  She is accompanied today by her daughter.  She has done physical therapy in the past.  01/23/21 No change in patient's medical history.  UDS appropriate.  We will have patient sign pain contract and take over her chronic pain regimen.  Controlled Substance Pharmacotherapy Assessment REMS (Risk Evaluation and Mitigation Strategy)   Pill Count: None expected due to no prior prescriptions written by our practice. Marilyn Billow, RN  01/23/2021 10:53 AM  Sign when Signing Visit Safety  precautions to be maintained throughout the outpatient stay will include: orient to surroundings, keep bed in low position, maintain call bell within  reach at all times, provide assistance with transfer out of bed and ambulation.  Pharmacokinetics: Liberation and absorption (onset of action): WNL Distribution (time to peak effect): WNL Metabolism and excretion (duration of action): WNL         Pharmacodynamics: Desired effects: Analgesia: Ms. Manny reports >50% benefit. Functional ability: Patient reports that medication allows her to accomplish basic ADLs Clinically meaningful improvement in function (CMIF): Sustained CMIF goals met Perceived effectiveness: Described as relatively effective, allowing for increase in activities of daily living (ADL) Undesirable effects: Side-effects or Adverse reactions: None reported Monitoring: [place] PMP: PDMP not reviewed this encounter. Online review of the past 15-monthperiod previously conducted. Not applicable at this point since we have not taken over the patient's medication management yet. List of other Serum/Urine Drug Screening Test(s):  No results found for: AMPHSCRSER, BARBSCRSER, BENZOSCRSER, COCAINSCRSER, COCAINSCRNUR, PCPSCRSER, THCSCRSER, THCU, CANNABQUANT, [place] E[place]urricane[place] of all UDS test(s) done:  Lab Results  Component Value Date   SUMMARY Note 01/10/2021   Last UDS on record: Summary  Date Value Ref Range Status  01/10/2021 Note  Final    Comment:    ==================================================================== Compliance Drug Analysis, Ur ==================================================================== Test                             Result       Flag       Units  Drug Present and Declared for Prescription Verification   Tramadol                       >9804        EXPECTED   ng/mg creat   O-Desmethyltramadol            5612         EXPECTED   ng/mg creat   N-Desmethyltramadol            >9804        EXPECTED   ng/mg creat    Source of tramadol is a prescription medication. O-desmethyltramadol    and N-desmethyltramadol are  expected metabolites of tramadol.    Ibuprofen                      PRESENT      EXPECTED  Drug Present not Declared for Prescription Verification   Diphenhydramine                PRESENT      UNEXPECTED ==================================================================== Test                      Result    Flag   Units      Ref Range   Creatinine              51               mg/dL      >=20 ==================================================================== Declared Medications:  The flagging and interpretation on this report are based on the  following declared medications.  Unexpected results may arise from  inaccuracies in the declared medications.   **Note: The testing scope of this panel includes these medications:   Tramadol (Ultram)   **Note: The testing scope of this panel does not include small to  moderate amounts of  these reported medications:   Ibuprofen (Advil)   **Note: The testing scope of this panel does not include the  following reported medications:   Atorvastatin (Lipitor) ==================================================================== For clinical consultation, please call [phone]. ====================================================================    UDS interpretation: No unexpected findings.          Medication Assessment Form: Patient introduced to form today Treatment compliance: Treatment may start today if patient agrees with proposed plan. Evaluation of compliance is not applicable at this point Risk Assessment Profile: Aberrant behavior: See initial evaluations. None observed or detected today Comorbid factors increasing risk of overdose: See initial evaluation. No additional risks detected today Opioid risk tool (ORT):  Opioid Risk  01/10/2021  Alcohol 0  Illegal Drugs 0  Rx Drugs 0  Alcohol 0  Illegal Drugs 0  Rx Drugs 0  Age between 16-45 years  0  History of Preadolescent Sexual Abuse 0  Psychological Disease 0   Depression 0  Opioid Risk Tool Scoring 0  Opioid Risk Interpretation Low Risk    ORT Scoring interpretation table:  Score <3 = Low Risk for SUD  Score between 4-7 = Moderate Risk for SUD  Score >8 = High Risk for Opioid Abuse   Risk of substance use disorder (SUD): Low  Risk Mitigation Strategies:  Patient opioid safety counseling: Completed today. Counseling provided to patient as per "Patient Counseling Document". Document signed by patient, attesting to counseling and understanding Patient-Prescriber Agreement (PPA): Obtained today.  Controlled substance notification to other providers: Written and sent today.  Pharmacologic Plan: Today we may be taking over the patient's pharmacological regimen. See below.             Laboratory Chemistry Profile   Renal Lab Results  Component Value Date   BUN 19 05/16/2019   CREATININE 0.81 05/16/2019   GFRAA >60 05/16/2019   GFRNONAA >60 05/16/2019   PROTEINUR Negative 10/10/2011     Electrolytes Lab Results  Component Value Date   NA 140 05/16/2019   K 3.7 05/16/2019   CL 108 05/16/2019   CALCIUM 8.8 (L) 05/16/2019     Hepatic Lab Results  Component Value Date   AST 18 12/26/2016   ALT 15 12/26/2016   ALBUMIN 3.9 12/26/2016   ALKPHOS 48 12/26/2016     ID No results found for: LYMEIGGIGMAB, HIV, SARSCOV2NAA, STAPHAUREUS, MRSAPCR, HCVAB, PREGTESTUR, RMSFIGG, QFVRPH1IGG, QFVRPH2IGG, LYMEIGGIGMAB   Bone No results found for: VD25OH, CW237SE8BTD, G2877219, [id number], 25OHVITD1, 25OHVITD2, 25OHVITD3, TESTOFREE, TESTOSTERONE   Endocrine Lab Results  Component Value Date   GLUCOSE 99 05/16/2019   GLUCOSEU Negative 10/10/2011     Neuropathy No results found for: VITAMINB12, FOLATE, HGBA1C, HIV   CNS No results found for: COLORCSF, APPEARCSF, RBCCOUNTCSF, WBCCSF, POLYSCSF, LYMPHSCSF, EOSCSF, PROTEINCSF, GLUCCSF, JCVIRUS, CSFOLI, IGGCSF, LABACHR, ACETBL, LABACHR, ACETBL   Inflammation (CRP: Acute  ESR: Chronic) No  results found for: CRP, ESRSEDRATE, LATICACIDVEN   Rheumatology No results found for: RF, ANA, LABURIC, URICUR, LYMEIGGIGMAB, LYMEABIGMQN, HLAB27   Coagulation Lab Results  Component Value Date   INR 0.9 10/10/2011   LABPROT 12.0 10/10/2011   PLT 366 05/16/2019     Cardiovascular Lab Results  Component Value Date   HGB 13.0 05/16/2019   HCT 41.1 05/16/2019     Screening No results found for: SARSCOV2NAA, COVIDSOURCE, STAPHAUREUS, MRSAPCR, HCVAB, HIV, PREGTESTUR   Cancer Lab Results  Component Value Date   CEA 2.8 12/26/2016     Allergens No results found for: ALMOND, APPLE, ASPARAGUS, AVOCADO, BANANA, BARLEY,  BASIL, BAYLEAF, GREENBEAN, LIMABEAN, WHITEBEAN, BEEFIGE, REDBEET, BLUEBERRY, BROCCOLI, CABBAGE, MELON, CARROT, CASEIN, CASHEWNUT, CAULIFLOWER, CELERY     Note: Lab results reviewed.   Meds   Current Outpatient Medications:    atorvastatin (LIPITOR) 20 MG tablet, Take 20 mg by mouth daily., Disp: , Rfl:    ibuprofen (ADVIL,MOTRIN) 200 MG tablet, Take 200 mg by mouth every 6 (six) hours as needed. Reported on 10/31/2015, Disp: , Rfl:    [START ON 01/31/2021] traMADol (ULTRAM) 50 MG tablet, Take 1 tablet (50 mg total) by mouth every 8 (eight) hours as needed., Disp: 75 tablet, Rfl: 1  Current Facility-Administered Medications:    bupivacaine (PF) (MARCAINE) 0.25 % injection 30 mL, 30 mL, Other, Once, Mohammed Kindle, MD   bupivacaine (PF) (MARCAINE) 0.25 % injection 30 mL, 30 mL, Other, Once, Mohammed Kindle, MD   ceFAZolin (ANCEF) IVPB 1 g/50 mL premix, 1 g, Intravenous, Once, Mohammed Kindle, MD   lactated ringers infusion 1,000 mL, 1,000 mL, Intravenous, Continuous, Mohammed Kindle, MD   midazolam (VERSED) 5 MG/5ML injection 5 mg, 5 mg, Intravenous, Once, Mohammed Kindle, MD   orphenadrine (NORFLEX) injection 60 mg, 60 mg, Intramuscular, Once, Mohammed Kindle, MD   sodium chloride flush (NS) 0.9 % injection 20 mL, 20 mL, Other, Once, Mohammed Kindle, MD   triamcinolone  acetonide (KENALOG-40) injection 40 mg, 40 mg, Other, Once, Mohammed Kindle, MD   triamcinolone acetonide (KENALOG-40) injection 40 mg, 40 mg, Other, Once, Mohammed Kindle, MD   triamcinolone acetonide (KENALOG-40) injection 40 mg, 40 mg, Other, Once, Mohammed Kindle, MD  ROS  Constitutional: Denies any fever or chills Gastrointestinal: No reported hemesis, hematochezia, vomiting, or acute GI distress Musculoskeletal: Denies any acute onset joint swelling, redness, loss of ROM, or weakness Neurological: No reported episodes of acute onset apraxia, aphasia, dysarthria, agnosia, amnesia, paralysis, loss of coordination, or loss of consciousness  Allergies  Ms. Craney has No Known Allergies.  PFSH  Drug: Ms. Pangilinan  reports no history of drug use. Alcohol:  reports no history of alcohol use. Tobacco:  reports that she has never smoked. She has never used smokeless tobacco. Medical:  has a past medical history of Cancer of sigmoid colon ([place]) (12/08/2014), Colonic mass (10/07/2012), DDD (degenerative disc disease), lumbar (12/16/2014), Diverticulitis (06/27/2011), Facet syndrome, lumbar (12/16/2014), GERD (gastroesophageal reflux disease) (05/26/2012), Insomnia (06/18/2016), Lesion of bladder (04/29/2012), Lumbar radiculopathy (10/04/2015), Sacroiliac joint dysfunction of both sides (12/16/2014), and Status post total replacement of right hip (08/15/2015). Surgical: Ms. Manfredi  has a past surgical history that includes Colon resection; Hip surgery (Right); Lung surgery; Colonoscopy with propofol (N/A, 07/02/2016); and Esophagogastroduodenoscopy (egd) with propofol (N/A, 07/02/2016). Family: family history includes Arthritis in her father; Breast cancer in an other family member; Early death in her mother; Hypertension in her father.  Constitutional Exam  General appearance: Well nourished, well developed, and well hydrated. In no apparent acute distress Vitals:   01/23/21 1038  BP: (!) 165/79  Pulse: 75  Resp:  16  Temp: (!) 97.4 F (36.3 C)  TempSrc: Temporal  SpO2: 99%  Weight: 145 lb (65.8 kg)  Height: '5\' 5"'  (1.651 m)   BMI Assessment: Estimated body mass index is 24.13 kg/m as calculated from the following:   Height as of this encounter: '5\' 5"'  (1.651 m).   Weight as of this encounter: 145 lb (65.8 kg).  BMI interpretation table: BMI level Category Range association with higher incidence of chronic pain  <18 kg/m2 Underweight   18.5-24.9 kg/m2 Ideal body weight  25-29.9 kg/m2 Overweight Increased incidence by 20%  30-34.9 kg/m2 Obese (Class I) Increased incidence by 68%  35-39.9 kg/m2 Severe obesity (Class II) Increased incidence by 136%  >40 kg/m2 Extreme obesity (Class III) Increased incidence by 254%   Patient's current BMI Ideal Body weight  Body mass index is 24.13 kg/m. Ideal body weight: 57 kg (125 lb 10.6 oz) Adjusted ideal body weight: 60.5 kg (133 lb 6.4 oz)   BMI Readings from Last 4 Encounters:  01/23/21 24.13 kg/m  01/10/21 24.13 kg/m  05/16/19 24.96 kg/m  12/26/16 25.64 kg/m   Wt Readings from Last 4 Encounters:  01/23/21 145 lb (65.8 kg)  01/10/21 145 lb (65.8 kg)  05/16/19 150 lb (68 kg)  12/26/16 154 lb 1.6 oz (69.9 kg)    Psych/Mental status: Alert, oriented x 3 (person, place, & time)       Eyes: PERLA Respiratory: No evidence of acute respiratory distress    Lumbar Spine Area Exam  Skin & Axial Inspection: No masses, redness, or swelling Alignment: Symmetrical Functional ROM: Pain restricted ROM       Stability: No instability detected Muscle Tone/Strength: Functionally intact. No obvious neuro-muscular anomalies detected. Sensory (Neurological): Musculoskeletal pain pattern   Ambulation: Limited Gait: Antalgic Posture: WNL  Lower Extremity Exam      Side: Right lower extremity   Side: Left lower extremity  Stability: No instability observed           Stability: No instability observed          Skin & Extremity Inspection: Evidence of  prior arthroplastic surgery   Skin & Extremity Inspection: Skin color, temperature, and hair growth are WNL. No peripheral edema or cyanosis. No masses, redness, swelling, asymmetry, or associated skin lesions. No contractures.  Functional ROM: Pain restricted ROM hip                    Functional ROM: Unrestricted ROM                  Muscle Tone/Strength: Functionally intact. No obvious neuro-muscular anomalies detected.   Muscle Tone/Strength: Functionally intact. No obvious neuro-muscular anomalies detected.  Sensory (Neurological): Musculoskeletal pain pattern         Sensory (Neurological): Unimpaired        DTR: Patellar: deferred today Achilles: deferred today Plantar: deferred today   DTR: Patellar: deferred today Achilles: deferred today Plantar: deferred today  Palpation: No palpable anomalies   Palpation: No palpable anomalies    Assessment & Plan  Primary Diagnosis & Pertinent Problem List: The primary encounter diagnosis was Chronic pain syndrome. Diagnoses of Pain due to total hip replacement, sequela, Sacroiliac joint dysfunction of both sides, Lumbar radiculopathy, Facet syndrome, lumbar, and Pain management contract signed were also pertinent to this visit.  Visit Diagnosis: 1. Chronic pain syndrome   2. Pain due to total hip replacement, sequela   3. Sacroiliac joint dysfunction of both sides   4. Lumbar radiculopathy   5. Facet syndrome, lumbar   6. Pain management contract signed    Plan of Care  Pharmacotherapy (Medications Ordered): Meds ordered this encounter  Medications   traMADol (ULTRAM) 50 MG tablet    Sig: Take 1 tablet (50 mg total) by mouth every 8 (eight) hours as needed.    Dispense:  75 tablet    Refill:  1  Ibuprofen PRN     Provider-requested follow-up: Return in about 10 weeks (around 04/03/2021) for Medication Management, in person. Recent Visits Date  Type Provider Dept  01/10/21 Office Visit Gillis Santa, MD [facility]   Showing recent visits within past 90 days and meeting all other requirements Today's Visits Date Type Provider Dept  01/23/21 Office Visit Gillis Santa, MD [facility]  Showing today's visits and meeting all other requirements Future Appointments Date Type Provider Dept  03/28/21 Appointment Gillis Santa, MD [facility]  Showing future appointments within next 90 days and meeting all other requirements I spent a total of 30 minutes reviewing chart data, face-to-face evaluation with the patient, counseling and coordination of care as detailed above.  Primary Care Physician: Gayland Curry, MD Note by: Gillis Santa, MD Date: 01/23/2021; Time: 11:18 AM

## 2021-01-23 NOTE — Progress Notes (Signed)
Safety precautions to be maintained throughout the outpatient stay will include: orient to surroundings, keep bed in low position, maintain call bell within reach at all times, provide assistance with transfer out of bed and ambulation.  

## 2021-03-28 ENCOUNTER — Other Ambulatory Visit: Payer: Self-pay

## 2021-03-28 ENCOUNTER — Encounter: Payer: Self-pay | Admitting: Student in an Organized Health Care Education/Training Program

## 2021-03-28 ENCOUNTER — Ambulatory Visit
Payer: Medicare Other | Attending: Student in an Organized Health Care Education/Training Program | Admitting: Student in an Organized Health Care Education/Training Program

## 2021-03-28 VITALS — BP 162/70 | HR 74 | Temp 96.9°F | Resp 16 | Ht 65.0 in | Wt 150.0 lb

## 2021-03-28 DIAGNOSIS — M533 Sacrococcygeal disorders, not elsewhere classified: Secondary | ICD-10-CM | POA: Diagnosis not present

## 2021-03-28 DIAGNOSIS — T8484XS Pain due to internal orthopedic prosthetic devices, implants and grafts, sequela: Secondary | ICD-10-CM | POA: Diagnosis not present

## 2021-03-28 DIAGNOSIS — Z0289 Encounter for other administrative examinations: Secondary | ICD-10-CM | POA: Diagnosis not present

## 2021-03-28 DIAGNOSIS — M5416 Radiculopathy, lumbar region: Secondary | ICD-10-CM

## 2021-03-28 DIAGNOSIS — M47816 Spondylosis without myelopathy or radiculopathy, lumbar region: Secondary | ICD-10-CM | POA: Diagnosis not present

## 2021-03-28 DIAGNOSIS — Z96649 Presence of unspecified artificial hip joint: Secondary | ICD-10-CM

## 2021-03-28 DIAGNOSIS — G894 Chronic pain syndrome: Secondary | ICD-10-CM

## 2021-03-28 NOTE — Progress Notes (Signed)
Nursing Pain Medication Assessment:  Safety precautions to be maintained throughout the outpatient stay will include: orient to surroundings, keep bed in low position, maintain call bell within reach at all times, provide assistance with transfer out of bed and ambulation.  Medication Inspection Compliance: Pill count conducted under aseptic conditions, in front of the patient. Neither the pills nor the bottle was removed from the patient's sight at any time. Once count was completed pills were immediately returned to the patient in their original bottle.  Medication: Tramadol (Ultram) Pill/Patch Count:  20 of 28 pills remain Pill/Patch Appearance: Markings consistent with prescribed medication Bottle Appearance: Standard pharmacy container. Clearly labeled. Filled Date: 8 / 27 / 2022 Last Medication intake:  Today  From Dr. Rudene Christians

## 2021-03-28 NOTE — Progress Notes (Signed)
PROVIDER NOTE: Information contained herein reflects review and annotations entered in association with encounter. Interpretation of such information and data should be left to medically-trained personnel. Information provided to patient can be located elsewhere in the medical record under "Patient Instructions". Document created using STT-dictation technology, any transcriptional errors that may result from process are unintentional.    Patient: Marilyn Romero  Service Category: E/M  Provider: Gillis Santa, MD  DOB: 1933-08-25  DOS: 03/28/2021  Specialty: Interventional Pain Management  MRN: [id number]  Setting: Ambulatory outpatient  PCP: Gayland Curry, MD  Type: Established Patient    Referring Provider: Gayland Curry, MD  Location: Office  Delivery: Face-to-face     HPI  [name], a 87 y.o. year old female, is here today because of her Pain due to total hip replacement, sequela [T84.84XS, Z96.649]. Marilyn Romero primary complain today is Hip Pain (right) Last encounter: My last encounter with her was on 01/23/2021. Pertinent problems: Marilyn Romero has DDD (degenerative disc disease), lumbar; Facet syndrome, lumbar; Sacroiliac joint dysfunction of both sides; Greater trochanteric bursitis; Lumbar radiculopathy; Status post total replacement of right hip; Primary osteoarthritis of one hip; Pain due to total hip replacement ([place]); and Chronic pain syndrome on their pertinent problem list. Pain Assessment: Severity of Chronic pain is reported as a 8 /10. Location: Hip Right/to knee. Onset: More than a month ago. Quality: Aching. Timing: Constant. Modifying factor(s): meds, ice. Vitals:  height is '5\' 5"'  (1.651 m) and weight is 150 lb (68 kg). Her temporal temperature is 96.9 F (36.1 C) (abnormal). Her blood pressure is 162/70 (abnormal) and her pulse is 74. Her respiration is 16 and oxygen saturation is 98%.   Reason for encounter: medication management.    No change in medical history since  last visit.  Patient's pain is at baseline.  Patient and her daughter were unaware that she still has 2 prescriptions of tramadol at her pharmacy that can be filled which we verified today by calling pharmacy and confirming which took some time..  I reviewed our discussion from our last encounter.    03/28/21 No change in patient's medical history.  UDS appropriate.  We will have patient sign pain contract and take over her chronic pain regimen.  01/10/21 Marilyn Romero is a pleasant 87 year old female who presents with a chief complaint of right hip pain.  She is status post right hip replacement surgery with subsequent revision.  Her initial surgery was in [place] and her revision was done a couple of years later at [facility].  She has tried steroid injections in her right hip.  She endorses a knot in her right hip and groin region that is painful when she is going from supine to sitting and standing.  She is on tramadol 50 mg twice daily which has been managed by orthopedics.  She is being referred here for chronic pain management, primarily management of her tramadol.  She is accompanied today by her daughter.  She has done physical therapy in the past.  Pharmacotherapy Assessment  Analgesic: Tramadol 50 mg BID-TID prn   Monitoring: [place] PMP: PDMP reviewed during this encounter.       Pharmacotherapy: No side-effects or adverse reactions reported. Compliance: No problems identified. Effectiveness: Clinically acceptable.  [name], RN  03/28/2021 11:35 AM  Sign when Signing Visit Nursing Pain Medication Assessment:  Safety precautions to be maintained throughout the outpatient stay will include: orient to surroundings, keep bed in low position, maintain call bell within reach  at all times, provide assistance with transfer out of bed and ambulation.  Medication Inspection Compliance: Pill count conducted under aseptic conditions, in front of the patient. Neither the pills nor the bottle was removed  from the patient's sight at any time. Once count was completed pills were immediately returned to the patient in their original bottle.  Medication: Tramadol (Ultram) Pill/Patch Count:  20 of 28 pills remain Pill/Patch Appearance: Markings consistent with prescribed medication Bottle Appearance: Standard pharmacy container. Clearly labeled. Filled Date: 8 / 27 / 2022 Last Medication intake:  Today  From Dr. Rudene Christians      UDS:  Summary  Date Value Ref Range Status  01/10/2021 Note  Final    Comment:    ==================================================================== Compliance Drug Analysis, Ur ==================================================================== Test                             Result       Flag       Units  Drug Present and Declared for Prescription Verification   Tramadol                       >9804        EXPECTED   ng/mg creat   O-Desmethyltramadol            5612         EXPECTED   ng/mg creat   N-Desmethyltramadol            >9804        EXPECTED   ng/mg creat    Source of tramadol is a prescription medication. O-desmethyltramadol    and N-desmethyltramadol are expected metabolites of tramadol.    Ibuprofen                      PRESENT      EXPECTED  Drug Present not Declared for Prescription Verification   Diphenhydramine                PRESENT      UNEXPECTED ==================================================================== Test                      Result    Flag   Units      Ref Range   Creatinine              51               mg/dL      >=20 ==================================================================== Declared Medications:  The flagging and interpretation on this report are based on the  following declared medications.  Unexpected results may arise from  inaccuracies in the declared medications.   **Note: The testing scope of this panel includes these medications:   Tramadol (Ultram)   **Note: The testing scope of this panel does not  include small to  moderate amounts of these reported medications:   Ibuprofen (Advil)   **Note: The testing scope of this panel does not include the  following reported medications:   Atorvastatin (Lipitor) ==================================================================== For clinical consultation, please call [phone]. ====================================================================      ROS  Constitutional: Denies any fever or chills Gastrointestinal: No reported hemesis, hematochezia, vomiting, or acute GI distress Musculoskeletal: Denies any acute onset joint swelling, redness, loss of ROM, or weakness Neurological: No reported episodes of acute onset apraxia, aphasia, dysarthria, agnosia, amnesia, paralysis, loss of coordination, or loss of consciousness  Medication  Review  amLODipine, atorvastatin, ibuprofen, and traMADol  History Review  Allergy: Ms. Chevalier has No Known Allergies. Drug: Ms. Gibb  reports no history of drug use. Alcohol:  reports no history of alcohol use. Tobacco:  reports that she has never smoked. She has never used smokeless tobacco. Social: Ms. Marovich  reports that she has never smoked. She has never used smokeless tobacco. She reports that she does not drink alcohol and does not use drugs. Medical:  has a past medical history of Cancer of sigmoid colon ([place]) (12/08/2014), Colonic mass (10/07/2012), DDD (degenerative disc disease), lumbar (12/16/2014), Diverticulitis (06/27/2011), Facet syndrome, lumbar (12/16/2014), GERD (gastroesophageal reflux disease) (05/26/2012), Insomnia (06/18/2016), Lesion of bladder (04/29/2012), Lumbar radiculopathy (10/04/2015), Sacroiliac joint dysfunction of both sides (12/16/2014), and Status post total replacement of right hip (08/15/2015). Surgical: Ms. Petion  has a past surgical history that includes Colon resection; Hip surgery (Right); Lung surgery; Colonoscopy with propofol (N/A, 07/02/2016); and  Esophagogastroduodenoscopy (egd) with propofol (N/A, 07/02/2016). Family: family history includes Arthritis in her father; Breast cancer in an other family member; Early death in her mother; Hypertension in her father.  Laboratory Chemistry Profile   Renal Lab Results  Component Value Date   BUN 19 05/16/2019   CREATININE 0.81 05/16/2019   GFRAA >60 05/16/2019   GFRNONAA >60 05/16/2019    Hepatic Lab Results  Component Value Date   AST 18 12/26/2016   ALT 15 12/26/2016   ALBUMIN 3.9 12/26/2016   ALKPHOS 48 12/26/2016    Electrolytes Lab Results  Component Value Date   NA 140 05/16/2019   K 3.7 05/16/2019   CL 108 05/16/2019   CALCIUM 8.8 (L) 05/16/2019    Bone No results found for: VD25OH, VD125OH2TOT, FG1829HB7, [id number], 25OHVITD1, 25OHVITD2, 25OHVITD3, TESTOFREE, TESTOSTERONE  Inflammation (CRP: Acute Phase) (ESR: Chronic Phase) No results found for: CRP, ESRSEDRATE, LATICACIDVEN       Note: Above Lab results reviewed.  Recent Imaging Review  NM Bone W/Spect CLINICAL DATA:  Chronic RIGHT hip pain.  EXAM: NM BONE SCAN AND SPECT IMAGING  TECHNIQUE: After intravenous injection of radiopharmaceutical, delayed planar images were obtained in multiple projections. Additionally, delayed triplanar SPECT images were obtained through the area of interest.  RADIOPHARMACEUTICALS:  22.6 mCi Tc-60mMDP  COMPARISON:  CT abdomen 03/01/2017  FINDINGS: Planar imaging:  No hyperemia to the LEFT or RIGHT hip.  No abnormal blood pool activity.  No delayed uptake associated with the RIGHT femoral prosthetic.  SPECT imaging:  SPECT imaging of the pelvis demonstrates no abnormal activity associated with the RIGHT hip.  Degenerative uptake noted at the L3-L4 disc space.  IMPRESSION: No evidence of loosening or infection of the RIGHT hip prosthetic.  Electronically Signed   By: SSuzy BouchardM.D.   On: 10/09/2019 15:49 Note: Reviewed        Physical Exam   General appearance: Well nourished, well developed, and well hydrated. In no apparent acute distress Mental status: Alert, oriented x 3 (person, place, & time)       Respiratory: No evidence of acute respiratory distress Eyes: PERLA Vitals: BP (!) 162/70 (BP Location: Right Arm, Patient Position: Sitting, Cuff Size: Normal)   Pulse 74   Temp (!) 96.9 F (36.1 C) (Temporal)   Resp 16   Ht '5\' 5"'  (1.651 m)   Wt 150 lb (68 kg)   SpO2 98%   BMI 24.96 kg/m  BMI: Estimated body mass index is 24.96 kg/m as calculated from the following:   Height  as of this encounter: '5\' 5"'  (1.651 m).   Weight as of this encounter: 150 lb (68 kg). Ideal: Ideal body weight: 57 kg (125 lb 10.6 oz) Adjusted ideal body weight: 61.4 kg (135 lb 6.4 oz)      Lumbar Spine Area Exam  Skin & Axial Inspection: No masses, redness, or swelling Alignment: Symmetrical Functional ROM: Pain restricted ROM       Stability: No instability detected Muscle Tone/Strength: Functionally intact. No obvious neuro-muscular anomalies detected. Sensory (Neurological): Musculoskeletal pain pattern   Ambulation: Limited Gait: Antalgic Posture: WNL  Lower Extremity Exam      Side: Right lower extremity   Side: Left lower extremity  Stability: No instability observed           Stability: No instability observed          Skin & Extremity Inspection: Evidence of prior arthroplastic surgery   Skin & Extremity Inspection: Skin color, temperature, and hair growth are WNL. No peripheral edema or cyanosis. No masses, redness, swelling, asymmetry, or associated skin lesions. No contractures.  Functional ROM: Pain restricted ROM hip                    Functional ROM: Unrestricted ROM                  Muscle Tone/Strength: Functionally intact. No obvious neuro-muscular anomalies detected.   Muscle Tone/Strength: Functionally intact. No obvious neuro-muscular anomalies detected.  Sensory (Neurological): Musculoskeletal pain pattern          Sensory (Neurological): Unimpaired        DTR: Patellar: deferred today Achilles: deferred today Plantar: deferred today   DTR: Patellar: deferred today Achilles: deferred today Plantar: deferred today  Palpation: No palpable anomalies   Palpation: No palpable anomalies       Assessment   Status Diagnosis  Controlled Controlled Controlled 1. 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 Sacroiliac joint dysfunction of both sides   3. Pain management contract signed   4. Facet syndrome, lumbar   5. Lumbar radiculopathy   6. Chronic pain syndrome      Updated Problems: Problem  Chronic Pain Syndrome  Greater Trochanteric Bursitis  Lumbar Radiculopathy  Status Post Total Replacement of Right Hip  Primary Osteoarthritis of One Hip  Pain Due to Total Hip Replacement ([facility])   Overview:  Chronic in natures- has been on COT for many years   Ddd (Degenerative Disc Disease), Lumbar  Facet Syndrome, Lumbar  Sacroiliac Joint Dysfunction of Both Sides    Plan of Care   Follow-up as needed for medication refill.   Follow-up plan:   Return for patient will call to make appointment for med refill.    Recent Visits Date Type Provider Dept  01/23/21 Office Visit Gillis Santa, MD [facility]  01/10/21 Office Visit Gillis Santa, MD [facility]  Showing recent visits within past 90 days and meeting all other requirements Today's Visits Date Type Provider Dept  03/28/21 Office Visit Gillis Santa, MD [facility]  Showing today's visits and meeting all other requirements Future Appointments No visits were found meeting these conditions. Showing future appointments within next 90 days and meeting all other requirements I discussed the assessment and treatment plan with the patient. The patient was provided an opportunity to ask questions and all were answered. The patient agreed with the plan and demonstrated an understanding of the  instructions.  Patient advised to call back or  seek an in-person evaluation if the symptoms or condition worsens.  Duration of encounter: 71mnutes.  Note by: BGillis Santa MD Date: 03/28/2021; Time: 11:41 AM

## 2021-06-13 ENCOUNTER — Other Ambulatory Visit: Payer: Self-pay

## 2021-06-13 ENCOUNTER — Encounter: Payer: Self-pay | Admitting: Student in an Organized Health Care Education/Training Program

## 2021-06-13 ENCOUNTER — Ambulatory Visit
Payer: Medicare Other | Attending: Student in an Organized Health Care Education/Training Program | Admitting: Student in an Organized Health Care Education/Training Program

## 2021-06-13 VITALS — BP 162/81 | HR 82 | Temp 96.8°F | Resp 14 | Ht 65.0 in | Wt 150.0 lb

## 2021-06-13 DIAGNOSIS — M47816 Spondylosis without myelopathy or radiculopathy, lumbar region: Secondary | ICD-10-CM | POA: Insufficient documentation

## 2021-06-13 DIAGNOSIS — M533 Sacrococcygeal disorders, not elsewhere classified: Secondary | ICD-10-CM | POA: Diagnosis not present

## 2021-06-13 DIAGNOSIS — T8484XS Pain due to internal orthopedic prosthetic devices, implants and grafts, sequela: Secondary | ICD-10-CM | POA: Diagnosis present

## 2021-06-13 DIAGNOSIS — M5416 Radiculopathy, lumbar region: Secondary | ICD-10-CM | POA: Diagnosis present

## 2021-06-13 DIAGNOSIS — Z0289 Encounter for other administrative examinations: Secondary | ICD-10-CM | POA: Diagnosis not present

## 2021-06-13 DIAGNOSIS — Z96649 Presence of unspecified artificial hip joint: Secondary | ICD-10-CM | POA: Diagnosis present

## 2021-06-13 DIAGNOSIS — G894 Chronic pain syndrome: Secondary | ICD-10-CM | POA: Insufficient documentation

## 2021-06-13 MED ORDER — TRAMADOL HCL 50 MG PO TABS: 50.0000 mg | ORAL_TABLET | Freq: Two times a day (BID) | ORAL | 2 refills | Status: DC | PRN

## 2021-06-13 NOTE — Progress Notes (Signed)
PROVIDER NOTE: Information contained herein reflects review and annotations entered in association with encounter. Interpretation of such information and data should be left to medically-trained personnel. Information provided to patient can be located elsewhere in the medical record under "Patient Instructions". Document created using STT-dictation technology, any transcriptional errors that may result from process are unintentional.    Patient: Marilyn Romero  Service Category: E/M  Provider: Gillis Santa, MD  DOB: [birth year]  DOS: 06/13/2021  Specialty: Interventional Pain Management  MRN: [id number]  Setting: Ambulatory outpatient  PCP: Gayland Curry, MD  Type: Established Patient    Referring Provider: Gayland Curry, MD  Location: Office  Delivery: Face-to-face     HPI  [name], a 87 y.o. year old female, is here today because of her Pain due to total hip replacement, sequela [T84.84XS, Z96.649]. Marilyn Romero primary complain today is Back Pain (lower) Last encounter: My last encounter with her was on 03/28/21 Pertinent problems: Marilyn Romero has DDD (degenerative disc disease), lumbar; Facet syndrome, lumbar; Sacroiliac joint dysfunction of both sides; Greater trochanteric bursitis; Lumbar radiculopathy; Status post total replacement of right hip; Primary osteoarthritis of one hip; Pain due to total hip replacement ([place]); and Chronic pain syndrome on their pertinent problem list. Pain Assessment: Severity of Chronic pain is reported as a 6 /10. Location: Back Lower/right hip and right groin. Onset: More than a month ago. Quality: Constant. Timing: Constant. Modifying factor(s): medications, ice. Vitals:  height is '5\' 5"'  (1.651 m) and weight is 150 lb (68 kg). Her temporal temperature is 96.8 F (36 C) (abnormal). Her blood pressure is 162/81 (abnormal) and her pulse is 82. Her respiration is 14 and oxygen saturation is 97%.   Reason for encounter: medication management.    No change  in medical history since last visit.  Patient's pain is at baseline. Here for Tramadol refill, takes 50 mg BID, and very seldomly when her pain is very severe she will take 100 mg.  She is accompanied today by her sister.  No recent falls.  Tinges to endorse persistent right hip pain that is worse with weightbearing.  History of right hip replacement.   03/28/21 No change in patient's medical history.  UDS appropriate.  We will have patient sign pain contract and take over her chronic pain regimen.  01/10/21 Marilyn Romero is a pleasant 87 year old female who presents with a chief complaint of right hip pain.  She is status post right hip replacement surgery with subsequent revision.  Her initial surgery was in [place] and her revision was done a couple of years later at [facility].  She has tried steroid injections in her right hip.  She endorses a knot in her right hip and groin region that is painful when she is going from supine to sitting and standing.  She is on tramadol 50 mg twice daily which has been managed by orthopedics.  She is being referred here for chronic pain management, primarily management of her tramadol.  She is accompanied today by her daughter.  She has done physical therapy in the past.  Pharmacotherapy Assessment  Analgesic: Tramadol 50 mg BID-TID prn   Monitoring: [place] PMP: PDMP reviewed during this encounter.       Pharmacotherapy: No side-effects or adverse reactions reported. Compliance: No problems identified. Effectiveness: Clinically acceptable.  Marilyn Martins, RN  06/13/2021  8:44 AM  Sign when Signing Visit Nursing Pain Medication Assessment:  Safety precautions to be maintained throughout the outpatient stay will include:  orient to surroundings, keep bed in low position, maintain call bell within reach at all times, provide assistance with transfer out of bed and ambulation.  Medication Inspection Compliance: Pill count conducted under aseptic conditions, in front of the  patient. Neither the pills nor the bottle was removed from the patient's sight at any time. Once count was completed pills were immediately returned to the patient in their original bottle.  Medication: Tramadol (Ultram) Pill/Patch Count:  20 of 40 pills remain Pill/Patch Appearance: Markings consistent with prescribed medication Bottle Appearance: Standard pharmacy container. Clearly labeled. Filled Date: 38 / 07 / 2022 Last Medication intake:  Today  Prescribed by Dr. Rudene Christians     UDS:  Summary  Date Value Ref Range Status  01/10/2021 Note  Final    Comment:    ==================================================================== Compliance Drug Analysis, Ur ==================================================================== Test                             Result       Flag       Units  Drug Present and Declared for Prescription Verification   Tramadol                       >9804        EXPECTED   ng/mg creat   O-Desmethyltramadol            5612         EXPECTED   ng/mg creat   N-Desmethyltramadol            >9804        EXPECTED   ng/mg creat    Source of tramadol is a prescription medication. O-desmethyltramadol    and N-desmethyltramadol are expected metabolites of tramadol.    Ibuprofen                      PRESENT      EXPECTED  Drug Present not Declared for Prescription Verification   Diphenhydramine                PRESENT      UNEXPECTED ==================================================================== Test                      Result    Flag   Units      Ref Range   Creatinine              51               mg/dL      >=20 ==================================================================== Declared Medications:  The flagging and interpretation on this report are based on the  following declared medications.  Unexpected results may arise from  inaccuracies in the declared medications.   **Note: The testing scope of this panel includes these medications:   Tramadol  (Ultram)   **Note: The testing scope of this panel does not include small to  moderate amounts of these reported medications:   Ibuprofen (Advil)   **Note: The testing scope of this panel does not include the  following reported medications:   Atorvastatin (Lipitor) ==================================================================== For clinical consultation, please call [phone]. ====================================================================      ROS  Constitutional: Denies any fever or chills Gastrointestinal: No reported hemesis, hematochezia, vomiting, or acute GI distress Musculoskeletal:  Right hip pain Neurological: No reported episodes of acute onset apraxia, aphasia, dysarthria, agnosia, amnesia, paralysis, loss of coordination, or  loss of consciousness  Medication Review  amLODipine, atorvastatin, ibuprofen, and traMADol  History Review  Allergy: Ms. Sockwell has No Known Allergies. Drug: Ms. Keng  reports no history of drug use. Alcohol:  reports no history of alcohol use. Tobacco:  reports that she has never smoked. She has never used smokeless tobacco. Social: Ms. Halley  reports that she has never smoked. She has never used smokeless tobacco. She reports that she does not drink alcohol and does not use drugs. Medical:  has a past medical history of Cancer of sigmoid colon ([place]) (12/08/2014), Colonic mass (10/07/2012), DDD (degenerative disc disease), lumbar (12/16/2014), Diverticulitis (06/27/2011), Facet syndrome, lumbar (12/16/2014), GERD (gastroesophageal reflux disease) (05/26/2012), Insomnia (06/18/2016), Lesion of bladder (04/29/2012), Lumbar radiculopathy (10/04/2015), Sacroiliac joint dysfunction of both sides (12/16/2014), and Status post total replacement of right hip (08/15/2015). Surgical: Ms. Rigaud  has a past surgical history that includes Colon resection; Hip surgery (Right); Lung surgery; Colonoscopy with propofol (N/A, 07/02/2016); and  Esophagogastroduodenoscopy (egd) with propofol (N/A, 07/02/2016). Family: family history includes Arthritis in her father; Breast cancer in an other family member; Early death in her mother; Hypertension in her father.  Laboratory Chemistry Profile   Renal Lab Results  Component Value Date   BUN 19 05/16/2019   CREATININE 0.81 05/16/2019   GFRAA >60 05/16/2019   GFRNONAA >60 05/16/2019    Hepatic Lab Results  Component Value Date   AST 18 12/26/2016   ALT 15 12/26/2016   ALBUMIN 3.9 12/26/2016   ALKPHOS 48 12/26/2016    Electrolytes Lab Results  Component Value Date   NA 140 05/16/2019   K 3.7 05/16/2019   CL 108 05/16/2019   CALCIUM 8.8 (L) 05/16/2019    Bone No results found for: VD25OH, VD125OH2TOT, GX2119ER7, EY8144YJ8, 25OHVITD1, 25OHVITD2, 25OHVITD3, TESTOFREE, TESTOSTERONE  Inflammation (CRP: Acute Phase) (ESR: Chronic Phase) No results found for: CRP, ESRSEDRATE, LATICACIDVEN       Note: Above Lab results reviewed.  Recent Imaging Review  NM Bone W/Spect CLINICAL DATA:  Chronic RIGHT hip pain.  EXAM: NM BONE SCAN AND SPECT IMAGING  TECHNIQUE: After intravenous injection of radiopharmaceutical, delayed planar images were obtained in multiple projections. Additionally, delayed triplanar SPECT images were obtained through the area of interest.  RADIOPHARMACEUTICALS:  22.6 mCi Tc-37mMDP  COMPARISON:  CT abdomen 03/01/2017  FINDINGS: Planar imaging:  No hyperemia to the LEFT or RIGHT hip.  No abnormal blood pool activity.  No delayed uptake associated with the RIGHT femoral prosthetic.  SPECT imaging:  SPECT imaging of the pelvis demonstrates no abnormal activity associated with the RIGHT hip.  Degenerative uptake noted at the L3-L4 disc space.  IMPRESSION: No evidence of loosening or infection of the RIGHT hip prosthetic.  Electronically Signed   By: SSuzy BouchardM.D.   On: 10/09/2019 15:49 Note: Reviewed        Physical Exam   General appearance: Well nourished, well developed, and well hydrated. In no apparent acute distress Mental status: Alert, oriented x 3 (person, place, & time)       Respiratory: No evidence of acute respiratory distress Eyes: PERLA Vitals: BP (!) 162/81   Pulse 82   Temp (!) 96.8 F (36 C) (Temporal)   Resp 14   Ht '5\' 5"'  (1.651 m)   Wt 150 lb (68 kg)   SpO2 97%   BMI 24.96 kg/m  BMI: Estimated body mass index is 24.96 kg/m as calculated from the following:   Height as of this encounter: 5'  5" (1.651 m).   Weight as of this encounter: 150 lb (68 kg). Ideal: Ideal body weight: 57 kg (125 lb 10.6 oz) Adjusted ideal body weight: 61.4 kg (135 lb 6.4 oz)      Lumbar Spine Area Exam  Skin & Axial Inspection: No masses, redness, or swelling Alignment: Symmetrical Functional ROM: Pain restricted ROM       Stability: No instability detected Muscle Tone/Strength: Functionally intact. No obvious neuro-muscular anomalies detected. Sensory (Neurological): Musculoskeletal pain pattern   Ambulation: Limited Gait: Antalgic Posture: WNL  Lower Extremity Exam      Side: Right lower extremity   Side: Left lower extremity  Stability: No instability observed           Stability: No instability observed          Skin & Extremity Inspection: Evidence of prior arthroplastic surgery   Skin & Extremity Inspection: Skin color, temperature, and hair growth are WNL. No peripheral edema or cyanosis. No masses, redness, swelling, asymmetry, or associated skin lesions. No contractures.  Functional ROM: Pain restricted ROM hip                    Functional ROM: Unrestricted ROM                  Muscle Tone/Strength: Functionally intact. No obvious neuro-muscular anomalies detected.   Muscle Tone/Strength: Functionally intact. No obvious neuro-muscular anomalies detected.  Sensory (Neurological): Musculoskeletal pain pattern         Sensory (Neurological): Unimpaired        DTR: Patellar: deferred  today Achilles: deferred today Plantar: deferred today   DTR: Patellar: deferred today Achilles: deferred today Plantar: deferred today  Palpation: No palpable anomalies   Palpation: No palpable anomalies       Assessment   Status Diagnosis  Controlled Controlled Controlled 1. 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 Sacroiliac joint dysfunction of both sides   3. Pain management contract signed   4. Facet syndrome, lumbar   5. Lumbar radiculopathy   6. Chronic pain syndrome      Updated Problems: No problems updated.   Plan of Care   Requested Prescriptions   Signed Prescriptions Disp Refills   traMADol (ULTRAM) 50 MG tablet 60 tablet 2    Sig: Take 1 tablet (50 mg total) by mouth every 12 (twelve) hours as needed.     Follow-up plan:   Return in about 3 months (around 09/13/2021) for Medication Management, in person.    Recent Visits Date Type Provider Dept  03/28/21 Office Visit Gillis Santa, MD [facility]  Showing recent visits within past 90 days and meeting all other requirements Today's Visits Date Type Provider Dept  06/13/21 Office Visit Gillis Santa, MD [facility]  Showing today's visits and meeting all other requirements Future Appointments Date Type Provider Dept  09/07/21 Appointment Gillis Santa, MD [facility]  Showing future appointments within next 90 days and meeting all other requirements I discussed the assessment and treatment plan with the patient. The patient was provided an opportunity to ask questions and all were answered. The patient agreed with the plan and demonstrated an understanding of the instructions.  Patient advised to call back or seek an in-person evaluation if the symptoms or condition worsens.  Duration of encounter: 45mnutes.  Note by: BGillis Santa MD Date: 06/13/2021; Time: 9:17 AM

## 2021-06-13 NOTE — Progress Notes (Signed)
Nursing Pain Medication Assessment:  Safety precautions to be maintained throughout the outpatient stay will include: orient to surroundings, keep bed in low position, maintain call bell within reach at all times, provide assistance with transfer out of bed and ambulation.  Medication Inspection Compliance: Pill count conducted under aseptic conditions, in front of the patient. Neither the pills nor the bottle was removed from the patient's sight at any time. Once count was completed pills were immediately returned to the patient in their original bottle.  Medication: Tramadol (Ultram) Pill/Patch Count:  20 of 40 pills remain Pill/Patch Appearance: Markings consistent with prescribed medication Bottle Appearance: Standard pharmacy container. Clearly labeled. Filled Date: 6 / 07 / 2022 Last Medication intake:  Today  Prescribed by Dr. Rudene Christians

## 2021-09-07 ENCOUNTER — Ambulatory Visit
Payer: Medicare Other | Attending: Student in an Organized Health Care Education/Training Program | Admitting: Student in an Organized Health Care Education/Training Program

## 2021-09-07 ENCOUNTER — Encounter: Payer: Self-pay | Admitting: Student in an Organized Health Care Education/Training Program

## 2021-09-07 ENCOUNTER — Other Ambulatory Visit: Payer: Self-pay

## 2021-09-07 VITALS — BP 153/74 | HR 80 | Temp 97.3°F | Resp 18 | Ht 65.0 in | Wt 150.0 lb

## 2021-09-07 DIAGNOSIS — Z96649 Presence of unspecified artificial hip joint: Secondary | ICD-10-CM | POA: Diagnosis present

## 2021-09-07 DIAGNOSIS — T8484XS Pain due to internal orthopedic prosthetic devices, implants and grafts, sequela: Secondary | ICD-10-CM | POA: Insufficient documentation

## 2021-09-07 DIAGNOSIS — G894 Chronic pain syndrome: Secondary | ICD-10-CM | POA: Diagnosis present

## 2021-09-07 DIAGNOSIS — M47816 Spondylosis without myelopathy or radiculopathy, lumbar region: Secondary | ICD-10-CM | POA: Diagnosis present

## 2021-09-07 DIAGNOSIS — M533 Sacrococcygeal disorders, not elsewhere classified: Secondary | ICD-10-CM | POA: Insufficient documentation

## 2021-09-07 DIAGNOSIS — Z0289 Encounter for other administrative examinations: Secondary | ICD-10-CM | POA: Diagnosis present

## 2021-09-07 DIAGNOSIS — M5416 Radiculopathy, lumbar region: Secondary | ICD-10-CM | POA: Insufficient documentation

## 2021-09-07 MED ORDER — TRAMADOL HCL 50 MG PO TABS: 50.0000 mg | ORAL_TABLET | Freq: Two times a day (BID) | ORAL | 2 refills | Status: DC | PRN

## 2021-09-07 NOTE — Progress Notes (Signed)
Safety precautions to be maintained throughout the outpatient stay will include: orient to surroundings, keep bed in low position, maintain call bell within reach at all times, provide assistance with transfer out of bed and ambulation.   Nursing Pain Medication Assessment:  Safety precautions to be maintained throughout the outpatient stay will include: orient to surroundings, keep bed in low position, maintain call bell within reach at all times, provide assistance with transfer out of bed and ambulation.  Medication Inspection Compliance: Pill count conducted under aseptic conditions, in front of the patient. Neither the pills nor the bottle was removed from the patient's sight at any time. Once count was completed pills were immediately returned to the patient in their original bottle.  Medication: Tramadol (Ultram) Pill/Patch Count:  1 of 60 pills remain Pill/Patch Appearance: Markings consistent with prescribed medication Bottle Appearance: Standard pharmacy container. Clearly labeled. Filled Date: 01 / 14 / 2023 Last Medication intake:  Yesterday

## 2021-09-07 NOTE — Progress Notes (Signed)
PROVIDER NOTE: Information contained herein reflects review and annotations entered in association with encounter. Interpretation of such information and data should be left to medically-trained personnel. Information provided to patient can be located elsewhere in the medical record under "Patient Instructions". Document created using STT-dictation technology, any transcriptional errors that may result from process are unintentional.    Patient: Marilyn Romero  Service Category: E/M  Provider: Gillis Santa, MD  DOB: 1934/06/04  DOS: 09/07/2021  Specialty: Interventional Pain Management  MRN: [id number]  Setting: Ambulatory outpatient  PCP: Gayland Curry, MD  Type: Established Patient    Referring Provider: Gayland Curry, MD  Location: Office  Delivery: Face-to-face     HPI  [name], a 89 y.o. year old female, is here today because of her Pain due to total hip replacement, sequela [T84.84XS, Z96.649]. Marilyn Romero primary complain today is Hip Pain  Last encounter: My last encounter with her was on 06/13/21  Pertinent problems: Marilyn Romero has DDD (degenerative disc disease), lumbar; Facet syndrome, lumbar; Sacroiliac joint dysfunction of both sides; Greater trochanteric bursitis; Lumbar radiculopathy; Status post total replacement of right hip; Primary osteoarthritis of one hip; Pain due to total hip replacement ([place]); and Chronic pain syndrome on their pertinent problem list. Pain Assessment: Severity of Chronic pain is reported as a 8 /10. Location: Hip Right/Radaites from rigth hip into groin area and also wraps into back constant and at times pain shoots down right leg. Onset: More than a month ago. Quality: Constant, Aching. Timing: Constant. Modifying factor(s): Medication and ice. Vitals:  height is '5\' 5"'  (1.651 m) and weight is 150 lb (68 kg). Her temporal temperature is 97.3 F (36.3 C) (abnormal). Her blood pressure is 153/74 (abnormal) and her pulse is 80. Her respiration is 18  and oxygen saturation is 99%.   Reason for encounter: medication management.    No change in medical history since last visit.  Patient's pain is at baseline. Here for Tramadol refill, takes 50 mg BID, and very seldomly when her pain is very severe she will take 100 mg.  She is accompanied today by her sister.  No recent falls.  Tinges to endorse persistent right hip pain that is worse with weightbearing.  History of right hip replacement.   03/28/21 No change in patient's medical history.  UDS appropriate.  We will have patient sign pain contract and take over her chronic pain regimen.  01/10/21 Marilyn Romero is a pleasant 88 year old female who presents with a chief complaint of right hip pain.  She is status post right hip replacement surgery with subsequent revision.  Her initial surgery was in [place] and her revision was done a couple of years later at [facility].  She has tried steroid injections in her right hip.  She endorses a knot in her right hip and groin region that is painful when she is going from supine to sitting and standing.  She is on tramadol 50 mg twice daily which has been managed by orthopedics.  She is being referred here for chronic pain management, primarily management of her tramadol.  She is accompanied today by her daughter.  She has done physical therapy in the past.  Pharmacotherapy Assessment  Analgesic: Tramadol 50 mg BID-TID prn   Monitoring: [place] PMP: PDMP reviewed during this encounter.       Pharmacotherapy: No side-effects or adverse reactions reported. Compliance: No problems identified. Effectiveness: Clinically acceptable.  Marilyn Decant, RN  09/07/2021 11:54 AM  Sign when Signing  Visit Safety precautions to be maintained throughout the outpatient stay will include: orient to surroundings, keep bed in low position, maintain call bell within reach at all times, provide assistance with transfer out of bed and ambulation.   Nursing Pain Medication Assessment:   Safety precautions to be maintained throughout the outpatient stay will include: orient to surroundings, keep bed in low position, maintain call bell within reach at all times, provide assistance with transfer out of bed and ambulation.  Medication Inspection Compliance: Pill count conducted under aseptic conditions, in front of the patient. Neither the pills nor the bottle was removed from the patient's sight at any time. Once count was completed pills were immediately returned to the patient in their original bottle.  Medication: Tramadol (Ultram) Pill/Patch Count:  1 of 60 pills remain Pill/Patch Appearance: Markings consistent with prescribed medication Bottle Appearance: Standard pharmacy container. Clearly labeled. Filled Date: 01 / 14 / 2023 Last Medication intake:  Yesterday     UDS:  Summary  Date Value Ref Range Status  01/10/2021 Note  Final    Comment:    ==================================================================== Compliance Drug Analysis, Ur ==================================================================== Test                             Result       Flag       Units  Drug Present and Declared for Prescription Verification   Tramadol                       >9804        EXPECTED   ng/mg creat   O-Desmethyltramadol            5612         EXPECTED   ng/mg creat   N-Desmethyltramadol            >9804        EXPECTED   ng/mg creat    Source of tramadol is a prescription medication. O-desmethyltramadol    and N-desmethyltramadol are expected metabolites of tramadol.    Ibuprofen                      PRESENT      EXPECTED  Drug Present not Declared for Prescription Verification   Diphenhydramine                PRESENT      UNEXPECTED ==================================================================== Test                      Result    Flag   Units      Ref Range   Creatinine              51               mg/dL       >=20 ==================================================================== Declared Medications:  The flagging and interpretation on this report are based on the  following declared medications.  Unexpected results may arise from  inaccuracies in the declared medications.   **Note: The testing scope of this panel includes these medications:   Tramadol (Ultram)   **Note: The testing scope of this panel does not include small to  moderate amounts of these reported medications:   Ibuprofen (Advil)   **Note: The testing scope of this panel does not include the  following reported medications:   Atorvastatin (Lipitor) ==================================================================== For clinical consultation,  please call [phone]. ====================================================================      ROS  Constitutional: Denies any fever or chills Gastrointestinal: No reported hemesis, hematochezia, vomiting, or acute GI distress Musculoskeletal:  Right hip pain Neurological: No reported episodes of acute onset apraxia, aphasia, dysarthria, agnosia, amnesia, paralysis, loss of coordination, or loss of consciousness  Medication Review  amLODipine, atorvastatin, ibuprofen, and traMADol  History Review  Allergy: Ms. Scheuermann has No Known Allergies. Drug: Ms. Hehr  reports no history of drug use. Alcohol:  reports no history of alcohol use. Tobacco:  reports that she has never smoked. She has never used smokeless tobacco. Social: Ms. Hayashi  reports that she has never smoked. She has never used smokeless tobacco. She reports that she does not drink alcohol and does not use drugs. Medical:  has a past medical history of Cancer of sigmoid colon ([place]) (12/08/2014), Colonic mass (10/07/2012), DDD (degenerative disc disease), lumbar (12/16/2014), Diverticulitis (06/27/2011), Facet syndrome, lumbar (12/16/2014), GERD (gastroesophageal reflux disease) (05/26/2012), Insomnia (06/18/2016),  Lesion of bladder (04/29/2012), Lumbar radiculopathy (10/04/2015), Sacroiliac joint dysfunction of both sides (12/16/2014), and Status post total replacement of right hip (08/15/2015). Surgical: Ms. Lenoir  has a past surgical history that includes Colon resection; Hip surgery (Right); Lung surgery; Colonoscopy with propofol (N/A, 07/02/2016); and Esophagogastroduodenoscopy (egd) with propofol (N/A, 07/02/2016). Family: family history includes Arthritis in her father; Breast cancer in an other family member; Early death in her mother; Hypertension in her father.  Laboratory Chemistry Profile   Renal Lab Results  Component Value Date   BUN 19 05/16/2019   CREATININE 0.81 05/16/2019   GFRAA >60 05/16/2019   GFRNONAA >60 05/16/2019    Hepatic Lab Results  Component Value Date   AST 18 12/26/2016   ALT 15 12/26/2016   ALBUMIN 3.9 12/26/2016   ALKPHOS 48 12/26/2016    Electrolytes Lab Results  Component Value Date   NA 140 05/16/2019   K 3.7 05/16/2019   CL 108 05/16/2019   CALCIUM 8.8 (L) 05/16/2019    Bone No results found for: VD25OH, VD125OH2TOT, AL9379KW4, OX7353GD9, 25OHVITD1, 25OHVITD2, 25OHVITD3, TESTOFREE, TESTOSTERONE  Inflammation (CRP: Acute Phase) (ESR: Chronic Phase) No results found for: CRP, ESRSEDRATE, LATICACIDVEN       Note: Above Lab results reviewed.  Recent Imaging Review  NM Bone W/Spect CLINICAL DATA:  Chronic RIGHT hip pain.  EXAM: NM BONE SCAN AND SPECT IMAGING  TECHNIQUE: After intravenous injection of radiopharmaceutical, delayed planar images were obtained in multiple projections. Additionally, delayed triplanar SPECT images were obtained through the area of interest.  RADIOPHARMACEUTICALS:  22.6 mCi Tc-69mMDP  COMPARISON:  CT abdomen 03/01/2017  FINDINGS: Planar imaging:  No hyperemia to the LEFT or RIGHT hip.  No abnormal blood pool activity.  No delayed uptake associated with the RIGHT femoral prosthetic.  SPECT imaging:  SPECT  imaging of the pelvis demonstrates no abnormal activity associated with the RIGHT hip.  Degenerative uptake noted at the L3-L4 disc space.  IMPRESSION: No evidence of loosening or infection of the RIGHT hip prosthetic.  Electronically Signed   By: SSuzy BouchardM.D.   On: 10/09/2019 15:49  Note: Reviewed        Physical Exam  General appearance: Well nourished, well developed, and well hydrated. In no apparent acute distress Mental status: Alert, oriented x 3 (person, place, & time)       Respiratory: No evidence of acute respiratory distress Eyes: PERLA Vitals: BP (!) 153/74    Pulse 80    Temp (!) 97.3  F (36.3 C) (Temporal)    Resp 18    Ht '5\' 5"'  (1.651 m)    Wt 150 lb (68 kg)    SpO2 99%    BMI 24.96 kg/m  BMI: Estimated body mass index is 24.96 kg/m as calculated from the following:   Height as of this encounter: '5\' 5"'  (1.651 m).   Weight as of this encounter: 150 lb (68 kg). Ideal: Ideal body weight: 57 kg (125 lb 10.6 oz) Adjusted ideal body weight: 61.4 kg (135 lb 6.4 oz)      Lumbar Spine Area Exam  Skin & Axial Inspection: No masses, redness, or swelling Alignment: Symmetrical Functional ROM: Pain restricted ROM       Stability: No instability detected Muscle Tone/Strength: Functionally intact. No obvious neuro-muscular anomalies detected. Sensory (Neurological): Musculoskeletal pain pattern   Ambulation: Limited Gait: Antalgic Posture: WNL  Lower Extremity Exam      Side: Right lower extremity   Side: Left lower extremity  Stability: No instability observed           Stability: No instability observed          Skin & Extremity Inspection: Evidence of prior arthroplastic surgery   Skin & Extremity Inspection: Skin color, temperature, and hair growth are WNL. No peripheral edema or cyanosis. No masses, redness, swelling, asymmetry, or associated skin lesions. No contractures.  Functional ROM: Pain restricted ROM hip                    Functional ROM:  Unrestricted ROM                  Muscle Tone/Strength: Functionally intact. No obvious neuro-muscular anomalies detected.   Muscle Tone/Strength: Functionally intact. No obvious neuro-muscular anomalies detected.  Sensory (Neurological): Musculoskeletal pain pattern         Sensory (Neurological): Unimpaired        DTR: Patellar: deferred today Achilles: deferred today Plantar: deferred today   DTR: Patellar: deferred today Achilles: deferred today Plantar: deferred today  Palpation: No palpable anomalies   Palpation: No palpable anomalies       Assessment   Status Diagnosis  Controlled Controlled Controlled 1. 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 Sacroiliac joint dysfunction of both sides   3. Facet syndrome, lumbar   4. Lumbar radiculopathy   5. Pain management contract signed   6. Chronic pain syndrome        Plan of Care   Requested Prescriptions   Signed Prescriptions Disp Refills   traMADol (ULTRAM) 50 MG tablet 60 tablet 2    Sig: Take 1 tablet (50 mg total) by mouth every 12 (twelve) hours as needed.     Follow-up plan:   Return in about 3 months (around 12/05/2021) for Medication Management, in person.    Recent Visits Date Type Provider Dept  06/13/21 Office Visit Gillis Santa, MD [facility]  Showing recent visits within past 90 days and meeting all other requirements Today's Visits Date Type Provider Dept  09/07/21 Office Visit Gillis Santa, MD [facility]  Showing today's visits and meeting all other requirements Future Appointments Date Type Provider Dept  11/30/21 Appointment Gillis Santa, MD [facility]  Showing future appointments within next 90 days and meeting all other requirements  I discussed the assessment and treatment plan with the patient. The patient was provided an opportunity to ask questions and all were answered. The patient agreed with the plan  and demonstrated an understanding of the  instructions.  Patient advised to call back or seek an in-person evaluation if the symptoms or condition worsens.  Duration of encounter: 25mnutes.  Note by: BGillis Santa MD Date: 09/07/2021; Time: 1:28 PM

## 2021-11-24 ENCOUNTER — Other Ambulatory Visit: Payer: Self-pay | Admitting: Family Medicine

## 2021-11-24 DIAGNOSIS — Z1231 Encounter for screening mammogram for malignant neoplasm of breast: Secondary | ICD-10-CM

## 2021-11-30 ENCOUNTER — Ambulatory Visit
Payer: Medicare Other | Attending: Student in an Organized Health Care Education/Training Program | Admitting: Student in an Organized Health Care Education/Training Program

## 2021-11-30 ENCOUNTER — Encounter: Payer: Self-pay | Admitting: Student in an Organized Health Care Education/Training Program

## 2021-11-30 VITALS — BP 189/92 | HR 78 | Temp 98.1°F | Resp 16 | Ht 65.0 in | Wt 150.0 lb

## 2021-11-30 DIAGNOSIS — M533 Sacrococcygeal disorders, not elsewhere classified: Secondary | ICD-10-CM | POA: Diagnosis present

## 2021-11-30 DIAGNOSIS — T8484XS Pain due to internal orthopedic prosthetic devices, implants and grafts, sequela: Secondary | ICD-10-CM

## 2021-11-30 DIAGNOSIS — M47816 Spondylosis without myelopathy or radiculopathy, lumbar region: Secondary | ICD-10-CM | POA: Diagnosis present

## 2021-11-30 DIAGNOSIS — Z96649 Presence of unspecified artificial hip joint: Secondary | ICD-10-CM | POA: Insufficient documentation

## 2021-11-30 DIAGNOSIS — Z0289 Encounter for other administrative examinations: Secondary | ICD-10-CM | POA: Insufficient documentation

## 2021-11-30 DIAGNOSIS — M5416 Radiculopathy, lumbar region: Secondary | ICD-10-CM | POA: Diagnosis present

## 2021-11-30 DIAGNOSIS — G894 Chronic pain syndrome: Secondary | ICD-10-CM | POA: Diagnosis present

## 2021-11-30 MED ORDER — TRAMADOL HCL 50 MG PO TABS: 50.0000 mg | ORAL_TABLET | Freq: Two times a day (BID) | ORAL | 2 refills | Status: DC | PRN

## 2021-11-30 NOTE — Progress Notes (Signed)
Nursing Pain Medication Assessment:  ?Safety precautions to be maintained throughout the outpatient stay will include: orient to surroundings, keep bed in low position, maintain call bell within reach at all times, provide assistance with transfer out of bed and ambulation.  ?Medication Inspection Compliance: Pill count conducted under aseptic conditions, in front of the patient. Neither the pills nor the bottle was removed from the patient's sight at any time. Once count was completed pills were immediately returned to the patient in their original bottle. ? ?Medication: Tramadol (Ultram) ?Pill/Patch Count:  15 of 60 pills remain ?Pill/Patch Appearance: Markings consistent with prescribed medication ?Bottle Appearance: Standard pharmacy container. Clearly labeled. ?Filled Date: 04 / 14 / 2023 ?Last Medication intake:  Today ?

## 2021-11-30 NOTE — Progress Notes (Signed)
PROVIDER NOTE: Information contained herein reflects review and annotations entered in association with encounter. Interpretation of such information and data should be left to medically-trained personnel. Information provided to patient can be located elsewhere in the medical record under "Patient Instructions". Document created using STT-dictation technology, any transcriptional errors that may result from process are unintentional.  ?  ?Patient: Marilyn Romero  Service Category: E/M  Provider: Gillis Santa, MD  ?DOB: [birth year]  DOS: 11/30/2021  Specialty: Interventional Pain Management  ?MRN: [id number]  Setting: Ambulatory outpatient  PCP: Gayland Curry, MD  ?Type: Established Patient    Referring Provider: Gayland Curry, MD  ?Location: Office  Delivery: Face-to-face    ? ?HPI  ?[name], a 88 y.o. year old female, is here today because of her Pain due to total hip replacement, sequela [T84.84XS, Z96.649]. [name] primary complain today is Back Pain (lower) ? ?Last encounter: My last encounter with her was on 09/07/21 ? ?Pertinent problems: [name] has DDD (degenerative disc disease), lumbar; Facet syndrome, lumbar; Sacroiliac joint dysfunction of both sides; Greater trochanteric bursitis; Lumbar radiculopathy; Status post total replacement of right hip; Primary osteoarthritis of one hip; Pain due to total hip replacement ([place]); and Chronic pain syndrome on their pertinent problem list. ?Pain Assessment: Severity of Chronic pain is reported as a 8 /10. Location: Back Lower/to right hip and right upper thigh. Onset: More than a month ago. Quality: Aching. Timing: Constant. Modifying factor(s):  . ?Vitals:  height is '5\' 5"'$  (1.651 m) and weight is 150 lb (68 kg). Her temporal temperature is 98.1 ?F (36.7 ?C). Her blood pressure is 189/92 (abnormal) and her pulse is 78. Her respiration is 16 and oxygen saturation is 100%.  ? ?Reason for encounter: medication management.   ? ?No change in medical  history since last visit.  Patient's pain is at baseline. Here for Tramadol refill, takes 50 mg BID, and very seldomly when her pain is very severe she will take 100 mg.  No recent falls.  History of right hip replacement.  Persistent and severe right hip pain. ? ? ?03/28/21 ?No change in patient's medical history.  UDS appropriate.  We will have patient sign pain contract and take over her chronic pain regimen. ? ?01/10/21 ?Damonica Marilyn Romero is a pleasant 88 year old female who presents with a chief complaint of right hip pain.  She is status post right hip replacement surgery with subsequent revision.  Her initial surgery was in [place] and her revision was done a couple of years later at [facility].  She has tried steroid injections in her right hip.  She endorses a knot in her right hip and groin region that is painful when she is going from supine to sitting and standing.  She is on tramadol 50 mg twice daily which has been managed by orthopedics.  She is being referred here for chronic pain management, primarily management of her tramadol.  She is accompanied today by her daughter.  She has done physical therapy in the past. ? ?Pharmacotherapy Assessment  ?Analgesic: Tramadol 50 mg BID-TID prn  ? ?Monitoring: ?[place] PMP: PDMP reviewed during this encounter.       ?Pharmacotherapy: No side-effects or adverse reactions reported. ?Compliance: No problems identified. ?Effectiveness: Clinically acceptable. ? ?[name], RN  11/30/2021  1:06 PM  Sign when Signing Visit ?Nursing Pain Medication Assessment:  ?Safety precautions to be maintained throughout the outpatient stay will include: orient to surroundings, keep bed in low position, maintain call bell within  reach at all times, provide assistance with transfer out of bed and ambulation.  ?Medication Inspection Compliance: Pill count conducted under aseptic conditions, in front of the patient. Neither the pills nor the bottle was removed from the patient's sight at any time.  Once count was completed pills were immediately returned to the patient in their original bottle. ? ?Medication: Tramadol (Ultram) ?Pill/Patch Count:  15 of 60 pills remain ?Pill/Patch Appearance: Markings consistent with prescribed medication ?Bottle Appearance: Standard pharmacy container. Clearly labeled. ?Filled Date: 04 / 14 / 2023 ?Last Medication intake:  Today ?  ?  UDS:  ?Summary  ?Date Value Ref Range Status  ?01/10/2021 Note  Final  ?  Comment:  ?  ==================================================================== ?Compliance Drug Analysis, Ur ?==================================================================== ?Test                             Result       Flag       Units ? ?Drug Present and Declared for Prescription Verification ?  Tramadol                       >9804        EXPECTED   ng/mg creat ?  O-Desmethyltramadol            5612         EXPECTED   ng/mg creat ?  N-Desmethyltramadol            >9804        EXPECTED   ng/mg creat ?   Source of tramadol is a prescription medication. O-desmethyltramadol ?   and N-desmethyltramadol are expected metabolites of tramadol. ? ?  Ibuprofen                      PRESENT      EXPECTED ? ?Drug Present not Declared for Prescription Verification ?  Diphenhydramine                PRESENT      UNEXPECTED ?==================================================================== ?Test                      Result    Flag   Units      Ref Range ?  Creatinine              51               mg/dL      >=20 ?==================================================================== ?Declared Medications: ? The flagging and interpretation on this report are based on the ? following declared medications.  Unexpected results may arise from ? inaccuracies in the declared medications. ? ? **Note: The testing scope of this panel includes these medications: ? ? Tramadol (Ultram) ? ? **Note: The testing scope of this panel does not include small to ? moderate amounts of these reported  medications: ? ? Ibuprofen (Advil) ? ? **Note: The testing scope of this panel does not include the ? following reported medications: ? ? Atorvastatin (Lipitor) ?==================================================================== ?For clinical consultation, please call [phone]. ?==================================================================== ?  ?  ? ?ROS  ?Constitutional: Denies any fever or chills ?Gastrointestinal: No reported hemesis, hematochezia, vomiting, or acute GI distress ?Musculoskeletal:  Right hip pain ?Neurological: No reported episodes of acute onset apraxia, aphasia, dysarthria, agnosia, amnesia, paralysis, loss of coordination, or loss of consciousness ? ?Medication Review  ?atorvastatin, ibuprofen, and traMADol ? ?History Review  ?Allergy: Ms.  Lizana has No Known Allergies. ?Drug: Ms. Mantell  reports no history of drug use. ?Alcohol:  reports no history of alcohol use. ?Tobacco:  reports that she has never smoked. She has never used smokeless tobacco. ?Social: Ms. Erck  reports that she has never smoked. She has never used smokeless tobacco. She reports that she does not drink alcohol and does not use drugs. ?Medical:  has a past medical history of Cancer of sigmoid colon ([place]) (12/08/2014), Colonic mass (10/07/2012), DDD (degenerative disc disease), lumbar (12/16/2014), Diverticulitis (06/27/2011), Facet syndrome, lumbar (12/16/2014), GERD (gastroesophageal reflux disease) (05/26/2012), Insomnia (06/18/2016), Lesion of bladder (04/29/2012), Lumbar radiculopathy (10/04/2015), Sacroiliac joint dysfunction of both sides (12/16/2014), and Status post total replacement of right hip (08/15/2015). ?Surgical: Ms. Simonich  has a past surgical history that includes Colon resection; Hip surgery (Right); Lung surgery; Colonoscopy with propofol (N/A, 07/02/2016); and Esophagogastroduodenoscopy (egd) with propofol (N/A, 07/02/2016). ?Family: family history includes Arthritis in her father; Breast cancer in an  other family member; Early death in her mother; Hypertension in her father. ? ?Laboratory Chemistry Profile  ? ?Renal ?Lab Results  ?Component Value Date  ? BUN 19 05/16/2019  ? CREATININE 0.81 05/16/2019  ? G

## 2021-12-08 ENCOUNTER — Inpatient Hospital Stay: Payer: Medicare Other

## 2021-12-08 ENCOUNTER — Other Ambulatory Visit: Payer: Self-pay

## 2021-12-08 ENCOUNTER — Emergency Department: Payer: Medicare Other

## 2021-12-08 ENCOUNTER — Inpatient Hospital Stay
Admission: EM | Admit: 2021-12-08 | Discharge: 2021-12-12 | DRG: 389 | Disposition: A | Discharge status: Skilled Nursing Facility | Payer: Medicare Other | Attending: Osteopathic Medicine | Admitting: Osteopathic Medicine

## 2021-12-08 DIAGNOSIS — M7918 Myalgia, other site: Secondary | ICD-10-CM | POA: Insufficient documentation

## 2021-12-08 DIAGNOSIS — I10 Essential (primary) hypertension: Secondary | ICD-10-CM | POA: Diagnosis present

## 2021-12-08 DIAGNOSIS — Z9049 Acquired absence of other specified parts of digestive tract: Secondary | ICD-10-CM | POA: Diagnosis not present

## 2021-12-08 DIAGNOSIS — M7122 Synovial cyst of popliteal space [Baker], left knee: Secondary | ICD-10-CM

## 2021-12-08 DIAGNOSIS — K56609 Unspecified intestinal obstruction, unspecified as to partial versus complete obstruction: Secondary | ICD-10-CM | POA: Diagnosis not present

## 2021-12-08 DIAGNOSIS — N390 Urinary tract infection, site not specified: Secondary | ICD-10-CM | POA: Diagnosis present

## 2021-12-08 DIAGNOSIS — M1990 Unspecified osteoarthritis, unspecified site: Secondary | ICD-10-CM | POA: Diagnosis present

## 2021-12-08 DIAGNOSIS — Z85038 Personal history of other malignant neoplasm of large intestine: Secondary | ICD-10-CM

## 2021-12-08 DIAGNOSIS — R1310 Dysphagia, unspecified: Secondary | ICD-10-CM

## 2021-12-08 DIAGNOSIS — Z96641 Presence of right artificial hip joint: Secondary | ICD-10-CM | POA: Diagnosis present

## 2021-12-08 DIAGNOSIS — G894 Chronic pain syndrome: Secondary | ICD-10-CM | POA: Diagnosis present

## 2021-12-08 DIAGNOSIS — K219 Gastro-esophageal reflux disease without esophagitis: Secondary | ICD-10-CM | POA: Diagnosis present

## 2021-12-08 DIAGNOSIS — K8500 Idiopathic acute pancreatitis without necrosis or infection: Secondary | ICD-10-CM

## 2021-12-08 DIAGNOSIS — Z79899 Other long term (current) drug therapy: Secondary | ICD-10-CM | POA: Diagnosis not present

## 2021-12-08 DIAGNOSIS — Z79891 Long term (current) use of opiate analgesic: Secondary | ICD-10-CM | POA: Diagnosis not present

## 2021-12-08 DIAGNOSIS — R1084 Generalized abdominal pain: Principal | ICD-10-CM

## 2021-12-08 LAB — URINALYSIS, ROUTINE W REFLEX MICROSCOPIC
Bilirubin Urine: NEGATIVE
Glucose, UA: NEGATIVE mg/dL
Ketones, ur: 5 mg/dL — AB
Nitrite: NEGATIVE
Protein, ur: NEGATIVE mg/dL
Specific Gravity, Urine: >1.046 — ABNORMAL HIGH (ref 1.005–1.030)
Squamous Epithelial / HPF: NONE SEEN (ref 0–5)
pH: 6 (ref 5.0–8.0)

## 2021-12-08 LAB — COMPREHENSIVE METABOLIC PANEL
ALT: 19 U/L (ref 0–44)
AST: 23 U/L (ref 15–41)
Albumin: 4.5 g/dL (ref 3.5–5.0)
Alkaline Phosphatase: 62 U/L (ref 38–126)
Anion gap: 12 (ref 5–15)
BUN: 19 mg/dL (ref 8–23)
CO2: 23 mmol/L (ref 22–32)
Calcium: 9.7 mg/dL (ref 8.9–10.3)
Chloride: 106 mmol/L (ref 98–111)
Creatinine, Ser: 0.73 mg/dL (ref 0.44–1.00)
GFR, Estimated: >60 mL/min (ref >60)
Glucose, Bld: 131 mg/dL — ABNORMAL HIGH (ref 70–99)
Potassium: 4 mmol/L (ref 3.5–5.1)
Sodium: 141 mmol/L (ref 135–145)
Total Bilirubin: 1.2 mg/dL (ref 0.3–1.2)
Total Protein: 7.7 g/dL (ref 6.5–8.1)

## 2021-12-08 LAB — CBC WITH DIFFERENTIAL/PLATELET
Abs Immature Granulocytes: 0.02 10*3/uL (ref 0.00–0.07)
Basophils Absolute: 0.1 10*3/uL (ref 0.0–0.1)
Basophils Relative: 1 %
Eosinophils Absolute: 0.1 10*3/uL (ref 0.0–0.5)
Eosinophils Relative: 1 %
HCT: 43.5 % (ref 36.0–46.0)
Hemoglobin: 14.2 g/dL (ref 12.0–15.0)
Immature Granulocytes: 0 %
Lymphocytes Relative: 14 %
Lymphs Abs: 1.2 10*3/uL (ref 0.7–4.0)
MCH: 28.9 pg (ref 26.0–34.0)
MCHC: 32.6 g/dL (ref 30.0–36.0)
MCV: 88.4 fL (ref 80.0–100.0)
Monocytes Absolute: 0.6 10*3/uL (ref 0.1–1.0)
Monocytes Relative: 7 %
Neutro Abs: 6.6 10*3/uL (ref 1.7–7.7)
Neutrophils Relative %: 77 %
Platelets: 343 10*3/uL (ref 150–400)
RBC: 4.92 MIL/uL (ref 3.87–5.11)
RDW: 13.2 % (ref 11.5–15.5)
WBC: 8.6 10*3/uL (ref 4.0–10.5)
nRBC: 0 % (ref 0.0–0.2)

## 2021-12-08 LAB — TROPONIN I (HIGH SENSITIVITY)
Troponin I (High Sensitivity): 6 ng/L (ref <18)
Troponin I (High Sensitivity): 7 ng/L (ref <18)

## 2021-12-08 LAB — LIPASE, BLOOD: Lipase: 114 U/L — ABNORMAL HIGH (ref 11–51)

## 2021-12-08 MED ORDER — ACETAMINOPHEN 325 MG PO TABS
650.0000 mg | ORAL_TABLET | Freq: Four times a day (QID) | ORAL | Status: DC | PRN
  Administered 2021-12-10: 650 mg via ORAL
  Filled 2021-12-08 (×2): qty 2

## 2021-12-08 MED ORDER — ENOXAPARIN SODIUM 40 MG/0.4ML IJ SOSY
40.0000 mg | PREFILLED_SYRINGE | INTRAMUSCULAR | Status: DC
  Administered 2021-12-08 – 2021-12-11 (×4): 40 mg via SUBCUTANEOUS
  Filled 2021-12-08 (×4): qty 0.4

## 2021-12-08 MED ORDER — FENTANYL CITRATE PF 50 MCG/ML IJ SOSY
50.0000 ug | PREFILLED_SYRINGE | Freq: Once | INTRAMUSCULAR | Status: AC
  Administered 2021-12-08: 50 ug via INTRAVENOUS
  Filled 2021-12-08: qty 1

## 2021-12-08 MED ORDER — SODIUM CHLORIDE 0.9 % IV SOLN
1.0000 g | INTRAVENOUS | Status: DC
  Administered 2021-12-08 – 2021-12-10 (×3): 1 g via INTRAVENOUS
  Filled 2021-12-08: qty 10
  Filled 2021-12-08: qty 1
  Filled 2021-12-08: qty 10
  Filled 2021-12-08: qty 1

## 2021-12-08 MED ORDER — ONDANSETRON HCL 4 MG/2ML IJ SOLN
4.0000 mg | Freq: Four times a day (QID) | INTRAMUSCULAR | Status: DC | PRN
  Administered 2021-12-09: 4 mg via INTRAVENOUS
  Filled 2021-12-08: qty 2

## 2021-12-08 MED ORDER — IOHEXOL 300 MG/ML  SOLN
100.0000 mL | Freq: Once | INTRAMUSCULAR | Status: AC | PRN
  Administered 2021-12-08: 100 mL via INTRAVENOUS

## 2021-12-08 MED ORDER — ACETAMINOPHEN 650 MG RE SUPP: 650.0000 mg | Freq: Four times a day (QID) | RECTAL | Status: DC | PRN

## 2021-12-08 MED ORDER — SODIUM CHLORIDE 0.9 % IV BOLUS
500.0000 mL | Freq: Once | INTRAVENOUS | Status: AC
  Administered 2021-12-08: 500 mL via INTRAVENOUS

## 2021-12-08 MED ORDER — ONDANSETRON HCL 4 MG PO TABS: 4.0000 mg | ORAL_TABLET | Freq: Four times a day (QID) | ORAL | Status: DC | PRN

## 2021-12-08 MED ORDER — PANTOPRAZOLE SODIUM 40 MG IV SOLR
40.0000 mg | INTRAVENOUS | Status: DC
  Administered 2021-12-08 – 2021-12-10 (×3): 40 mg via INTRAVENOUS
  Filled 2021-12-08 (×3): qty 10

## 2021-12-08 MED ORDER — HYDRALAZINE HCL 20 MG/ML IJ SOLN
5.0000 mg | Freq: Four times a day (QID) | INTRAMUSCULAR | Status: DC | PRN
Start: 2021-12-08 — End: 2021-12-12

## 2021-12-08 MED ORDER — MORPHINE SULFATE (PF) 2 MG/ML IV SOLN
2.0000 mg | INTRAVENOUS | Status: DC | PRN
  Administered 2021-12-08 – 2021-12-10 (×9): 2 mg via INTRAVENOUS
  Filled 2021-12-08 (×9): qty 1

## 2021-12-08 MED ORDER — SODIUM CHLORIDE 0.9% FLUSH
3.0000 mL | Freq: Two times a day (BID) | INTRAVENOUS | Status: DC
  Administered 2021-12-08 – 2021-12-12 (×7): 3 mL via INTRAVENOUS

## 2021-12-08 MED ORDER — MORPHINE SULFATE (PF) 4 MG/ML IV SOLN
4.0000 mg | Freq: Once | INTRAVENOUS | Status: AC
  Administered 2021-12-08: 4 mg via INTRAVENOUS
  Filled 2021-12-08: qty 1

## 2021-12-08 MED ORDER — SODIUM CHLORIDE 0.45 % IV SOLN: INTRAVENOUS | Status: DC

## 2021-12-08 NOTE — TOC Initial Note (Signed)
Transition of Care (TOC) - Initial/Assessment Note  ? ? ?Patient Details  ?Name: Marilyn Romero ?MRN: [id number] ?Date of Birth: 1933/09/07 ? ?Transition of Care (TOC) CM/SW Contact:    ?Beverly Sessions, RN ?Phone Number: ?12/08/2021, 12:47 PM ? ?Clinical Narrative:                 ? ?Transition of Care (TOC) Screening Note ? ? ?Patient Details  ?Name: Marilyn QUIZHPI ?Date of Birth: 04-22-1934 ? ? ?Transition of Care (TOC) CM/SW Contact:    ?Beverly Sessions, RN ?Phone Number: ?12/08/2021, 12:47 PM ? ? ? ?Transition of Care Department [facility]) has reviewed patient and no TOC needs have been identified at this time. We will continue to monitor patient advancement through interdisciplinary progression rounds. If new patient transition needs arise, please place a TOC consult. ? ?Mobility level 5.  Ambulates with assist.  Please request PT eval if mobility declines ? ?  ?  ? ? ?Patient Goals and CMS Choice ?  ?  ?  ? ?Expected Discharge Plan and Services ?  ?  ?  ?  ?  ?                ?  ?  ?  ?  ?  ?  ?  ?  ?  ?  ? ?Prior Living Arrangements/Services ?  ?  ?  ?       ?  ?  ?  ?  ? ?Activities of Daily Living ?Home Assistive Devices/Equipment: Gilford Rile (specify type), Eyeglasses ?ADL Screening (condition at time of admission) ?Patient's cognitive ability adequate to safely complete daily activities?: Yes ?Is the patient deaf or have difficulty hearing?: No ?Does the patient have difficulty seeing, even when wearing glasses/contacts?: No ?Does the patient have difficulty concentrating, remembering, or making decisions?: No ?Patient able to express need for assistance with ADLs?: Yes ?Does the patient have difficulty dressing or bathing?: No ?Independently performs ADLs?: Yes (appropriate for developmental age) ?Does the patient have difficulty walking or climbing stairs?: No ?Weakness of Legs: None ?Weakness of Arms/Hands: None ? ?Permission Sought/Granted ?  ?  ?   ?   ?   ?   ? ?Emotional Assessment ?  ?  ?  ?  ?  ?   ? ?Admission diagnosis:  Small bowel obstruction ([place]) [K56.609] ?SBO (small bowel obstruction) ([place]) [K56.609] ?Generalized abdominal pain [R10.84] ?Idiopathic acute pancreatitis, unspecified complication status [Q75.91] ?Patient Active Problem List  ? Diagnosis Date Noted  ? SBO (small bowel obstruction) ([place]) 12/08/2021  ? UTI (urinary tract infection) 12/08/2021  ? Essential hypertension 12/08/2021  ? Dysphagia 12/08/2021  ? Chronic pain syndrome 01/10/2021  ? Personal history of colon cancer   ? Abdominal pain, epigastric   ? Acute peptic ulcer of stomach   ? Gastritis without bleeding   ? Insomnia 06/18/2016  ? Greater trochanteric bursitis 10/04/2015  ? Lumbar radiculopathy 10/04/2015  ? Status post total replacement of right hip 08/15/2015  ? Primary osteoarthritis of one hip 08/15/2015  ? Pain due to total hip replacement ([place]) 08/15/2015  ? DDD (degenerative disc disease), lumbar 12/16/2014  ? Facet syndrome, lumbar 12/16/2014  ? Sacroiliac joint dysfunction of both sides 12/16/2014  ? Cancer of sigmoid colon ([place]) 12/08/2014  ? Pure hypercholesterolemia 08/10/2013  ? Colonic mass 10/07/2012  ? GERD (gastroesophageal reflux disease) 05/26/2012  ? Lesion of bladder 04/29/2012  ? Atrophic vaginitis 04/29/2012  ? Dysuria 04/17/2012  ? Diverticulitis 06/27/2011  ? ?  PCP:  Gayland Curry, MD ?Pharmacy:   ?[facility] [place], [place] AT [facility] OF [address] ?[place] ?[place] [address] ?Phone: [phone] Fax: [phone] ? ? ? ? ?Social Determinants of Health (SDOH) Interventions ?  ? ?Readmission Risk Interventions ?   ? View : No data to display.  ?  ?  ?  ? ? ? ?

## 2021-12-08 NOTE — Assessment & Plan Note (Addendum)
Patient complains of dysphagia for both solids and liquids but denies having any significant weight loss ?? H&P mentions GI evaluation, no consult order in system, will assess ?

## 2021-12-08 NOTE — ED Triage Notes (Signed)
Patient brought in for generalized abdominal that radiates to left arm and left arm. Pain 10/10  ?

## 2021-12-08 NOTE — Assessment & Plan Note (Addendum)
Present on admission. Patient noted to have significant pyuria ?? Rocephin 1 g IV daily ?? urine culture: Mixed bacteria suggestive of contamination ?

## 2021-12-08 NOTE — ED Notes (Signed)
Informed RN bed assigned 

## 2021-12-08 NOTE — Progress Notes (Addendum)
Patient agreeable to NG tube. NG tube placed. X-ray ordered to verify placement.  ? ?

## 2021-12-08 NOTE — Consult Note (Signed)
?  ?Jonathon Bellows , MD ?[address], [place], [place], [place], [address] ?[address] [place], [place], [place], [address] ?Phone: [phone]  ?Fax: [phone] ? Consultation ? ?Referring Provider:     Dr Francine Graven ?Primary Care Physician:  Gayland Curry, MD ?Primary Gastroenterologist:  None         ?Reason for Consultation:     Dysphagia ? ?Date of Admission:  12/08/2021 ?Date of Consultation:  12/08/2021 ?       ? HPI:   ?MARGURETTE Romero is a 88 y.o. female presented to the emergency room earlier this morning with abdominal pain she is on tramadol.  She underwent a CT scan of the abdomen and pelvis in the ER and was found to have small bowel obstruction with transition point in the deep left upper quadrant.  Biliary dilation postcholecystectomy. ?She has had an upper endoscopy in 2017 by Dr. Thayer Jew showed no abnormality in the esophagus of the gastric ulcer at that time. ? ?12/08/2021 CMP: LFTs normal hemoglobin 14.2 g lipase 114 ? ? ?She says she has had difficulty swallowing solids and liquids for a few years, may have got a bit worse recently. Came into the hospital for nausea, vomitting and abdominal pain past few days. She says that she has a sensation that food and liquids get stuck in her throat but doesn't really think it stays impacted leading to vomiting.  ? ?Past Medical History:  ?Diagnosis Date  ? Cancer of sigmoid colon ([place]) 12/08/2014  ? Colonic mass 10/07/2012  ? Overview:  Gets routine colonoscopies. Dx was 2013.  ? DDD (degenerative disc disease), lumbar 12/16/2014  ? Diverticulitis 06/27/2011  ? Facet syndrome, lumbar 12/16/2014  ? GERD (gastroesophageal reflux disease) 05/26/2012  ? Insomnia 06/18/2016  ? Last Assessment & Plan:  Relevant Hx: Course: Daily Update: Today's Plan:  ? Lesion of bladder 04/29/2012  ? Lumbar radiculopathy 10/04/2015  ? Sacroiliac joint dysfunction of both sides 12/16/2014  ? Status post total replacement of right hip 08/15/2015  ? ? ?Past Surgical History:  ?Procedure  Laterality Date  ? COLON RESECTION    ? COLONOSCOPY WITH PROPOFOL N/A 07/02/2016  ? Procedure: COLONOSCOPY WITH PROPOFOL;  Surgeon: Lucilla Lame, MD;  Location: [facility];  Service: Endoscopy;  Laterality: N/A;  ? ESOPHAGOGASTRODUODENOSCOPY (EGD) WITH PROPOFOL N/A 07/02/2016  ? Procedure: ESOPHAGOGASTRODUODENOSCOPY (EGD) WITH PROPOFOL;  Surgeon: Lucilla Lame, MD;  Location: [facility];  Service: Endoscopy;  Laterality: N/A;  ? HIP SURGERY Right   ? LUNG SURGERY    ? non-malignant mass on lung  ? ? ?Prior to Admission medications   ?Medication Sig Start Date End Date Taking? Authorizing Provider  ?atorvastatin (LIPITOR) 20 MG tablet Take 20 mg by mouth daily.   Yes [provider]  ?traMADol (ULTRAM) 50 MG tablet Take 1 tablet (50 mg total) by mouth every 12 (twelve) hours as needed. 12/14/21 03/14/22 Yes Gillis Santa, MD  ?ibuprofen (ADVIL,MOTRIN) 200 MG tablet Take 200 mg by mouth every 6 (six) hours as needed. Reported on 10/31/2015    [provider]  ? ? ?Family History  ?Problem Relation Age of Onset  ? Early death Mother   ? Arthritis Father   ? Hypertension Father   ? Breast cancer Other   ?  ? ?Social History  ? ?Tobacco Use  ? Smoking status: Never  ? Smokeless tobacco: Never  ?Substance Use Topics  ? Alcohol use: No  ?  Alcohol/week: 0.0 standard drinks  ?  Drug use: No  ? ? ?Allergies as of 12/08/2021  ? (No Known Allergies)  ? ? ?Review of Systems:    ?All systems reviewed and negative except where noted in HPI. ? ? Physical Exam:  ?Vital signs in last 24 hours: ?Temp:  [98.7 ?F (37.1 ?C)] 98.7 ?F (37.1 ?C) (05/12 0529) ?Pulse Rate:  [76-88] 76 (05/12 0708) ?Resp:  [23-32] 32 (05/12 0708) ?BP: (174-195)/(74-99) 174/74 (05/12 0708) ?SpO2:  [97 %-100 %] 98 % (05/12 0708) ?Weight:  [68.3 kg] 68.3 kg (05/12 0530) ?  ?General:   Pleasant, cooperative in NAD ?Head:  Normocephalic and atraumatic. ?Eyes:   No icterus.   Conjunctiva pink. PERRLA. ?Ears:  Normal auditory acuity. ?Neck:   Supple; no masses or thyroidomegaly ?Lungs: Respirations even and unlabored. Lungs clear to auscultation bilaterally.   No wheezes, crackles, or rhonchi.  ?Heart:  Regular rate and rhythm;  Without murmur, clicks, rubs or gallops ?Abdomen:  Soft, nondistended, nontender. Normal bowel sounds. No appreciable masses or hepatomegaly.  No rebound or guarding.  ?Neurologic:  Alert and oriented x3;  grossly normal neurologically. ?Skin:  Intact without significant lesions or rashes. ?Cervical Nodes:  No significant cervical adenopathy. ?Psych:  Alert and cooperative. Normal affect. ? ?LAB RESULTS: ?Recent Labs  ?  12/08/21 ?5035  ?WBC 8.6  ?HGB 14.2  ?HCT 43.5  ?PLT 343  ? ?BMET ?Recent Labs  ?  12/08/21 ?4656  ?NA 141  ?K 4.0  ?CL 106  ?CO2 23  ?GLUCOSE 131*  ?BUN 19  ?CREATININE 0.73  ?CALCIUM 9.7  ? ?LFT ?Recent Labs  ?  12/08/21 ?8127  ?PROT 7.7  ?ALBUMIN 4.5  ?AST 23  ?ALT 19  ?ALKPHOS 62  ?BILITOT 1.2  ? ?PT/INR ?No results for input(s): LABPROT, INR in the last 72 hours. ? ?STUDIES: ?CT ABDOMEN PELVIS W CONTRAST ? ?Result Date: 12/08/2021 ?CLINICAL DATA:  Acute, diffuse abdominal pain. Urinary frequency for 1 month. Pain radiates to the left arm EXAM: CT ABDOMEN AND PELVIS WITH CONTRAST TECHNIQUE: Multidetector CT imaging of the abdomen and pelvis was performed using the standard protocol following bolus administration of intravenous contrast. RADIATION DOSE REDUCTION: This exam was performed according to the departmental dose-optimization program which includes automated exposure control, adjustment of the mA and/or kV according to patient size and/or use of iterative reconstruction technique. CONTRAST:  173m OMNIPAQUE IOHEXOL 300 MG/ML  SOLN COMPARISON:  03/01/2017 FINDINGS: Lower chest: No acute finding. Coronary atherosclerosis and mitral annular calcification. Mild subpleural scarring or atelectasis at the bases. Hepatobiliary: Cholecystectomy with chronic biliary dilatation, although progressed in the  liver.No focal liver finding. Pancreas: Unremarkable for age Spleen: Unremarkable. Adrenals/Urinary Tract: Negative adrenals. No hydronephrosis or stone. Right renal cystic densities. Unremarkable bladder. Stomach/Bowel: Dilated proximal small bowel and stomach with multiple fecalized segments of dilated small bowel. Transition point appears to be in the deep left upper quadrant where there is an angulated loop of bowel transitioning to decompressed distal small bowel. An internal hernia is considered given this appearance of the transition point is anterior to the IMV. No evidence of necrosis or perforation. Distal colonic diverticulosis with prior sigmoidectomy. Vascular/Lymphatic: Diffuse atheromatous calcification of the aorta and branch vessels. No mass or adenopathy. Reproductive:Hysterectomy Other: No ascites or pneumoperitoneum. Musculoskeletal: Lumbar spine degeneration and scoliosis. Posttraumatic or postoperative deformity of left posterior ribs. Total right hip arthroplasty. IMPRESSION: 1. Small bowel obstruction with transition point in the deep left upper quadrant. 2. Biliary dilatation post cholecystectomy, please correlate with liver function tests. Electronically  Signed   By: Jorje Guild M.D.   On: 12/08/2021 07:09   ? ? ? Impression / Plan:  ? ?SOLASH TULLO is a 88 y.o. y/o female has been admitted with a small bowel obstruction with transition point seen on CT scan of the abdomen I have been consulted for dysphagia. ? ?Plan ?1.  Would suggest to have the small bowel obstruction evaluated first before we consider any evaluation for dysphagia.  She should recover from the SBO before we can consider any EGD which can be done as an outpatient as it has been going on for years per her history.  ? ? ?I will sign off.  Please call me if any further GI concerns or questions.  We would like to thank you for the opportunity to participate in the care of SHARION GRIEVES.  ? ?Thank you for involving me in  the care of this patient.   ? ? ? LOS: 0 days  ? ?Jonathon Bellows, MD  12/08/2021, 9:18 AM ? ?  ? ?

## 2021-12-08 NOTE — H&P (Signed)
?History and Physical  ? ? ?Patient: Marilyn Romero RKY:[id number] DOB: 11-20-1933 ?DOA: 12/08/2021 ?DOS: the patient was seen and examined on 12/08/2021 ?PCP: Gayland Curry, MD  ?Patient coming from: Home ? ?Chief Complaint:  ?Chief Complaint  ?Patient presents with  ? Abdominal Pain  ?  Started 1930  ? ?HPI: Marilyn Romero is a 88 y.o. female with medical history significant for colon cancer status post hemicolectomy in 2013, GERD, degenerative joint disease who presents to the ER for evaluation of abdominal pain mostly in the periumbilical area with radiation to the lower quadrants.  She states that she has had pain for several years but worse on the day of admission which prompted her to call 911.  She rates her pain a 9 x 10 in intensity at its worst and denies having any nausea, vomiting, diarrhea or constipation. ?She denies any alcohol use.  She denies having any fever, no chills, no headache, no dizziness, no lightheadedness, no urinary symptoms, no leg swelling, no focal deficits or blurred vision. ?She also complains of dysphagia both for solids and liquids but denies having any significant weight loss.  Had an upper endoscopy in 2017 which was negative. ?Labs revealed elevated lipase levels in the ER and imaging shows small bowel obstruction with transition point in the deep left upper quadrant. ?She will be admitted to the hospital for further evaluation. ?Review of Systems: As mentioned in the history of present illness. All other systems reviewed and are negative. ?Past Medical History:  ?Diagnosis Date  ? Cancer of sigmoid colon ([place]) 12/08/2014  ? Colonic mass 10/07/2012  ? Overview:  Gets routine colonoscopies. Dx was 2013.  ? DDD (degenerative disc disease), lumbar 12/16/2014  ? Diverticulitis 06/27/2011  ? Facet syndrome, lumbar 12/16/2014  ? GERD (gastroesophageal reflux disease) 05/26/2012  ? Insomnia 06/18/2016  ? Last Assessment & Plan:  Relevant Hx: Course: Daily Update: Today's Plan:  ? Lesion  of bladder 04/29/2012  ? Lumbar radiculopathy 10/04/2015  ? Sacroiliac joint dysfunction of both sides 12/16/2014  ? Status post total replacement of right hip 08/15/2015  ? ?Past Surgical History:  ?Procedure Laterality Date  ? COLON RESECTION    ? COLONOSCOPY WITH PROPOFOL N/A 07/02/2016  ? Procedure: COLONOSCOPY WITH PROPOFOL;  Surgeon: Lucilla Lame, MD;  Location: [facility];  Service: Endoscopy;  Laterality: N/A;  ? ESOPHAGOGASTRODUODENOSCOPY (EGD) WITH PROPOFOL N/A 07/02/2016  ? Procedure: ESOPHAGOGASTRODUODENOSCOPY (EGD) WITH PROPOFOL;  Surgeon: Lucilla Lame, MD;  Location: [facility];  Service: Endoscopy;  Laterality: N/A;  ? HIP SURGERY Right   ? LUNG SURGERY    ? non-malignant mass on lung  ? ?Social History:  reports that she has never smoked. She has never used smokeless tobacco. She reports that she does not drink alcohol and does not use drugs. ? ?No Known Allergies ? ?Family History  ?Problem Relation Age of Onset  ? Early death Mother   ? Arthritis Father   ? Hypertension Father   ? Breast cancer Other   ? ? ?Prior to Admission medications   ?Medication Sig Start Date End Date Taking? Authorizing Provider  ?atorvastatin (LIPITOR) 20 MG tablet Take 20 mg by mouth daily.   Yes [provider]  ?traMADol (ULTRAM) 50 MG tablet Take 1 tablet (50 mg total) by mouth every 12 (twelve) hours as needed. 12/14/21 03/14/22 Yes Gillis Santa, MD  ?ibuprofen (ADVIL,MOTRIN) 200 MG tablet Take 200 mg by mouth every 6 (six) hours as needed. Reported on 10/31/2015  [provider]  ? ? ?Physical Exam: ?Vitals:  ? 12/08/21 0600 12/08/21 0708 12/08/21 1012 12/08/21 1050  ?BP: (!) 175/88 (!) 174/74  (!) 168/80  ?Pulse: 80 76 76 80  ?Resp: (!) 23 (!) 32 20 18  ?Temp:    98.2 ?F (36.8 ?C)  ?TempSrc:    Oral  ?SpO2: 97% 98% 97% 100%  ?Weight:      ?Height:      ? ?Physical Exam ?Vitals and nursing note reviewed.  ?Constitutional:   ?   Appearance: She is well-developed.  ?HENT:  ?   Head:  Normocephalic and atraumatic.  ?   Mouth/Throat:  ?   Mouth: Mucous membranes are moist.  ?Cardiovascular:  ?   Rate and Rhythm: Normal rate and regular rhythm.  ?Pulmonary:  ?   Effort: Pulmonary effort is normal.  ?   Breath sounds: Normal breath sounds.  ?Abdominal:  ?   General: Abdomen is flat. Bowel sounds are increased.  ?   Palpations: Abdomen is soft.  ?   Tenderness: There is abdominal tenderness in the periumbilical area, left upper quadrant and left lower quadrant.  ?Skin: ?   General: Skin is warm and dry.  ?Neurological:  ?   General: No focal deficit present.  ?   Mental Status: She is alert.  ?Psychiatric:     ?   Mood and Affect: Mood normal.     ?   Behavior: Behavior normal.  ? ? ?Data Reviewed: ?Relevant notes from primary care and specialist visits, past discharge summaries as available in EHR, including Care Everywhere. ?Prior diagnostic testing as pertinent to current admission diagnoses ?Updated medications and problem lists for reconciliation ?ED course, including vitals, labs, imaging, treatment and response to treatment ?Triage notes, nursing and pharmacy notes and ED provider's notes ?Notable results as noted in HPI ?Labs reviewed.  Sodium 141, potassium 4.0, chloride 106, bicarb 23, glucose 131, BUN 19, creatinine 0.73, lipase 114, white count 8.6, hemoglobin 14.2, hematocrit 43.5, RDW 13.2, platelet count 343 ?Patient has significant pyuria ?CT scan of abdomen and pelvis shows small bowel obstruction with transition point in the deep left ?upper quadrant. Biliary dilatation post cholecystectomy, please correlate with liver function tests. ?Twelve-lead EKG reviewed by me shows sinus rhythm with low voltage precordial leads ?There are no new results to review at this time. ? ?Assessment and Plan: ?* SBO (small bowel obstruction) ([place]) ?Patient presents for evaluation of worsening abdominal pain and imaging shows small bowel obstruction with transition point in the deep left upper  quadrant. ?Patient has had prior hemicolectomy for colon cancer ?We will keep patient n.p.o. ?Supportive care with IV fluid hydration, antiemetics and IV pain medication ?We will request surgery consult ? ?UTI (urinary tract infection) ?Patient noted to have significant pyuria ?We will start patient on Rocephin 1 g IV daily ?Follow-up results of urine culture ? ?GERD (gastroesophageal reflux disease) ?Stable ?Place patient on IV PPI ? ?Essential hypertension ?Blood pressure is uncontrolled ?We will place patient on as needed IV hydralazine ? ?Dysphagia ?Patient complains of dysphagia for both solids and liquids but denies having any significant weight loss ?We will request GI evaluation ? ? ? ? ? Advance Care Planning:   Code Status: Full Code  ? ?Consults: Surgery/gastroenterology ? ?Family Communication: Greater than 50% of time was spent discussing patient's condition and plan of care with her at the bedside.  All questions and concerns have been addressed.  She verbalizes understanding and agrees with the plan. ? ?  Severity of Illness: ?The appropriate patient status for this patient is INPATIENT. Inpatient status is judged to be reasonable and necessary in order to provide the required intensity of service to ensure the patient's safety. The patient's presenting symptoms, physical exam findings, and initial radiographic and laboratory data in the context of their chronic comorbidities is felt to place them at high risk for further clinical deterioration. Furthermore, it is not anticipated that the patient will be medically stable for discharge from the hospital within 2 midnights of admission.  ? ?* I certify that at the point of admission it is my clinical judgment that the patient will require inpatient hospital care spanning beyond 2 midnights from the point of admission due to high intensity of service, high risk for further deterioration and high frequency of surveillance required.* ? ?Author: ?Collier Bullock, MD ?12/08/2021 11:05 AM ? ?For on call review [email or web address].  ?

## 2021-12-08 NOTE — Consult Note (Signed)
SURGICAL CONSULTATION NOTE  ? ?HISTORY OF PRESENT ILLNESS (HPI):  ?88 y.o. female presented to [facility] ED for evaluation of upper abdominal pain. Patient reports having upper abdominal pain for the last few years.  She endorses that the pain intensified yesterday night.  Pain on the epigastric area and left lower quadrant.  Pain does not radiate to the part of the body.  She cannot identify any alleviating or aggravating factors.  She denies any nausea or vomiting. ? ?She was found with abdominal discomfort in the upper abdomen but no acute abdomen.  There was no leukocytosis.  White blood cell count of 8.6.  Normal hemoglobin.  There is no significant electrolyte disturbance.  Urinalysis does show possible UTI.  Lipase elevated.  CT scan of the abdomen and pelvis shows distended stomach and small intestine with transition point in the left upper quadrant.  There is no free air or free fluid.  I personally evaluated the images ? ?Surgery is consulted by Dr. Francine Graven in this context for evaluation and management of small bowel obstruction. ? ?PAST MEDICAL HISTORY (PMH):  ?Past Medical History:  ?Diagnosis Date  ? Cancer of sigmoid colon ([place]) 12/08/2014  ? Colonic mass 10/07/2012  ? Overview:  Gets routine colonoscopies. Dx was 2013.  ? DDD (degenerative disc disease), lumbar 12/16/2014  ? Diverticulitis 06/27/2011  ? Facet syndrome, lumbar 12/16/2014  ? GERD (gastroesophageal reflux disease) 05/26/2012  ? Insomnia 06/18/2016  ? Last Assessment & Plan:  Relevant Hx: Course: Daily Update: Today's Plan:  ? Lesion of bladder 04/29/2012  ? Lumbar radiculopathy 10/04/2015  ? Sacroiliac joint dysfunction of both sides 12/16/2014  ? Status post total replacement of right hip 08/15/2015  ?  ? ?PAST SURGICAL HISTORY ([place]):  ?Past Surgical History:  ?Procedure Laterality Date  ? COLON RESECTION    ? COLONOSCOPY WITH PROPOFOL N/A 07/02/2016  ? Procedure: COLONOSCOPY WITH PROPOFOL;  Surgeon: Lucilla Lame, MD;  Location: [place];   Service: Endoscopy;  Laterality: N/A;  ? ESOPHAGOGASTRODUODENOSCOPY (EGD) WITH PROPOFOL N/A 07/02/2016  ? Procedure: ESOPHAGOGASTRODUODENOSCOPY (EGD) WITH PROPOFOL;  Surgeon: Lucilla Lame, MD;  Location: [place];  Service: Endoscopy;  Laterality: N/A;  ? HIP SURGERY Right   ? LUNG SURGERY    ? non-malignant mass on lung  ?  ? ?MEDICATIONS:  ?Prior to Admission medications   ?Medication Sig Start Date End Date Taking? Authorizing Provider  ?atorvastatin (LIPITOR) 20 MG tablet Take 20 mg by mouth daily.   Yes [provider]  ?traMADol (ULTRAM) 50 MG tablet Take 1 tablet (50 mg total) by mouth every 12 (twelve) hours as needed. 12/14/21 03/14/22 Yes Gillis Santa, MD  ?ibuprofen (ADVIL,MOTRIN) 200 MG tablet Take 200 mg by mouth every 6 (six) hours as needed. Reported on 10/31/2015    [provider]  ?  ? ?ALLERGIES:  ?No Known Allergies  ? ?SOCIAL HISTORY:  ?Social History  ? ?Socioeconomic History  ? Marital status: Divorced  ?  Spouse name: Not on file  ? Number of children: Not on file  ? Years of education: Not on file  ? Highest education level: Not on file  ?Occupational History  ? Not on file  ?Tobacco Use  ? Smoking status: Never  ? Smokeless tobacco: Never  ?Substance and Sexual Activity  ? Alcohol use: No  ?  Alcohol/week: 0.0 standard drinks  ? Drug use: No  ? Sexual activity: Never  ?Other Topics Concern  ? Not on file  ?Social History Narrative  ?  Not on file  ? ?Social Determinants of Health  ? ?Financial Resource Strain: Not on file  ?Food Insecurity: Not on file  ?Transportation Needs: Not on file  ?Physical Activity: Not on file  ?Stress: Not on file  ?Social Connections: Not on file  ?Intimate Partner Violence: Not on file  ?  ? ? ?FAMILY HISTORY:  ?Family History  ?Problem Relation Age of Onset  ? Early death Mother   ? Arthritis Father   ? Hypertension Father   ? Breast cancer Other   ?  ? ?REVIEW OF SYSTEMS:  ?Constitutional: denies weight loss, fever, chills, or sweats   ?Eyes: denies any other vision changes, history of eye injury  ?ENT: denies sore throat, hearing problems  ?Respiratory: denies shortness of breath, wheezing  ?Cardiovascular: denies chest pain, palpitations  ?Gastrointestinal: Positive abdominal pain, negative nausea and vomiting ?Genitourinary: denies burning with urination or urinary frequency ?Musculoskeletal: denies any other joint pains or cramps  ?Skin: denies any other rashes or skin discolorations  ?Neurological: denies any other headache, dizziness, weakness  ?Psychiatric: denies any other depression, anxiety  ? ?All other review of systems were negative  ? ?VITAL SIGNS:  ?Temp:  [98.2 ?F (36.8 ?C)-98.7 ?F (37.1 ?C)] 98.2 ?F (36.8 ?C) (05/12 1050) ?Pulse Rate:  [76-88] 80 (05/12 1050) ?Resp:  [18-32] 18 (05/12 1050) ?BP: (168-195)/(74-99) 168/80 (05/12 1050) ?SpO2:  [97 %-100 %] 100 % (05/12 1050) ?Weight:  [68.3 kg] 68.3 kg (05/12 0530)     Height: '5\' 5"'$  (165.1 cm) Weight: 68.3 kg BMI (Calculated): 25.06  ? ?INTAKE/OUTPUT:  ?This shift: No intake/output data recorded.  ?Last 2 shifts: '@IOLAST2SHIFTS'$ @  ? ?PHYSICAL EXAM:  ?Constitutional:  ?-- Normal body habitus  ?-- Awake, alert, and oriented x3  ?Eyes:  ?-- Pupils equally round and reactive to light  ?-- No scleral icterus  ?Ear, nose, and throat:  ?-- No jugular venous distension  ?Pulmonary:  ?-- No crackles  ?-- Equal breath sounds bilaterally ?-- Breathing non-labored at rest ?Cardiovascular:  ?-- S1, S2 present  ?-- No pericardial rubs ?Gastrointestinal:  ?-- Abdomen soft, nontender, non-distended, no guarding or rebound tenderness ?-- No abdominal masses appreciated, pulsatile or otherwise  ?Musculoskeletal and Integumentary:  ?-- Wounds: None appreciated ?-- Extremities: B/L UE and LE FROM, hands and feet warm, no edema  ?Neurologic:  ?-- Motor function: intact and symmetric ?-- Sensation: intact and symmetric ? ? ?Labs:  ? ?  Latest Ref Rng & Units 12/08/2021  ?  5:36 AM 05/16/2019  ?  9:59 AM  12/26/2016  ? 10:50 AM  ?CBC  ?WBC 4.0 - 10.5 K/uL 8.6   5.2   5.8    ?Hemoglobin 12.0 - 15.0 g/dL 14.2   13.0   12.2    ?Hematocrit 36.0 - 46.0 % 43.5   41.1   37.3    ?Platelets 150 - 400 K/uL 343   366   282    ? ? ?  Latest Ref Rng & Units 12/08/2021  ?  5:36 AM 05/16/2019  ?  9:59 AM 12/26/2016  ? 10:50 AM  ?CMP  ?Glucose 70 - 99 mg/dL 131   99   84    ?BUN 8 - 23 mg/dL '19   19   18    '$ ?Creatinine 0.44 - 1.00 mg/dL 0.73   0.81   0.80    ?Sodium 135 - 145 mmol/L 141   140   136    ?Potassium 3.5 - 5.1 mmol/L 4.0  3.7   3.8    ?Chloride 98 - 111 mmol/L 106   108   108    ?CO2 22 - 32 mmol/L '23   21   22    '$ ?Calcium 8.9 - 10.3 mg/dL 9.7   8.8   8.7    ?Total Protein 6.5 - 8.1 g/dL 7.7    6.8    ?Total Bilirubin 0.3 - 1.2 mg/dL 1.2    0.9    ?Alkaline Phos 38 - 126 U/L 62    48    ?AST 15 - 41 U/L 23    18    ?ALT 0 - 44 U/L 19    15    ? ? ? ?Imaging studies:  ?EXAM: ?CT ABDOMEN AND PELVIS WITH CONTRAST ?  ?TECHNIQUE: ?Multidetector CT imaging of the abdomen and pelvis was performed ?using the standard protocol following bolus administration of ?intravenous contrast. ?  ?RADIATION DOSE REDUCTION: This exam was performed according to the ?departmental dose-optimization program which includes automated ?exposure control, adjustment of the mA and/or kV according to ?patient size and/or use of iterative reconstruction technique. ?  ?CONTRAST:  144m OMNIPAQUE IOHEXOL 300 MG/ML  SOLN ?  ?COMPARISON:  03/01/2017 ?  ?FINDINGS: ?Lower chest: No acute finding. Coronary atherosclerosis and mitral ?annular calcification. Mild subpleural scarring or atelectasis at ?the bases. ?  ?Hepatobiliary: Cholecystectomy with chronic biliary dilatation, ?although progressed in the liver.No focal liver finding. ?  ?Pancreas: Unremarkable for age ?  ?Spleen: Unremarkable. ?  ?Adrenals/Urinary Tract: Negative adrenals. No hydronephrosis or ?stone. Right renal cystic densities. Unremarkable bladder. ?  ?Stomach/Bowel: Dilated proximal small  bowel and stomach with ?multiple fecalized segments of dilated small bowel. Transition point ?appears to be in the deep left upper quadrant where there is an ?angulated loop of bowel transitioning to decom

## 2021-12-08 NOTE — Assessment & Plan Note (Addendum)
Blood pressure is uncontrolled ?? as needed IV hydralazine ?? Tolerating po, adding Losartan today 12/10/21  ?

## 2021-12-08 NOTE — ED Provider Notes (Signed)
I was asked to follow-up on CT radiology read.  Consistent with small bowel obstruction with transition point left upper quadrant.  I have consulted Dr. Peyton Najjar of general surgery ? ?He has asked for admission to the hospitalist team and he will consult ?  ?Lavonia Drafts, MD ?12/08/21 [phone] ? ?

## 2021-12-08 NOTE — ED Provider Notes (Signed)
? ?[facility] ?Provider Note ? ? ? Event Date/Time  ? First MD Initiated Contact with Patient 12/08/21 0530   ?  (approximate) ? ? ?History  ? ?Abdominal Pain (Started 1930) ? ? ?HPI ? ?Marilyn Romero is a 88 y.o. female who presents to the ED for evaluation of Abdominal Pain (Started 1930) ?  ?I review pain clinic visit from 5/4.  History of chronic pain syndrome with pain contract.  Tramadol twice daily. ? ?Patient presents to the ED from home via EMS for evaluation of generalized abdominal pain for the past 10 hours.  She reports consistent pain in this time that has not gone away.  Denies any associated symptoms such as emesis, diarrhea, cough, shortness of breath, fever or dysuria.  She reports 1 month of urinary frequency without any recent changes. ? ?Physical Exam  ? ?Triage Vital Signs: ?ED Triage Vitals  ?Enc Vitals Group  ?   BP 12/08/21 0529 (!) 195/99  ?   Pulse Rate 12/08/21 0529 86  ?   Resp --   ?   Temp 12/08/21 0529 98.7 ?F (37.1 ?C)  ?   Temp Source 12/08/21 0529 Oral  ?   SpO2 12/08/21 0529 100 %  ?   Weight 12/08/21 0530 150 lb 9.2 oz (68.3 kg)  ?   Height 12/08/21 0530 '5\' 5"'$  (1.651 m)  ?   Head Circumference --   ?   Peak Flow --   ?   Pain Score 12/08/21 0530 10  ?   Pain Loc --   ?   Pain Edu? --   ?   Excl. in [place]? --   ? ? ?Most recent vital signs: ?Vitals:  ? 12/08/21 0530 12/08/21 0600  ?BP: (!) 195/99 (!) 175/88  ?Pulse: 88 80  ?Resp:  (!) 23  ?Temp:    ?SpO2: 99% 97%  ? ? ?General: Awake, no distress.  ?CV:  Good peripheral perfusion.  ?Resp:  Normal effort.  ?Abd:  No distention.  Diffuse tenderness without localizing or peritoneal features. ?MSK:  No deformity noted.  ?Neuro:  No focal deficits appreciated. ?Other:   ? ? ?ED Results / Procedures / Treatments  ? ?Labs ?(all labs ordered are listed, but only abnormal results are displayed) ?Labs Reviewed  ?COMPREHENSIVE METABOLIC PANEL - Abnormal; Notable for the following components:  ?    Result Value  ? Glucose,  Bld 131 (*)   ? All other components within normal limits  ?LIPASE, BLOOD - Abnormal; Notable for the following components:  ? Lipase 114 (*)   ? All other components within normal limits  ?CBC WITH DIFFERENTIAL/PLATELET  ?URINALYSIS, ROUTINE W REFLEX MICROSCOPIC  ?TROPONIN I (HIGH SENSITIVITY)  ? ? ?EKG ?Sinus rhythm, rate of 86 bpm.  Normal axis and intervals.  No evidence of acute ischemia. ? ?RADIOLOGY ? ? ?Official radiology report(s): ?No results found. ? ?PROCEDURES and INTERVENTIONS: ? ?.1-3 Lead EKG Interpretation ?Performed by: Vladimir Crofts, MD ?Authorized by: Vladimir Crofts, MD  ? ?  Interpretation: normal   ?  ECG rate:  86 ?  ECG rate assessment: normal   ?  Rhythm: sinus rhythm   ?  Ectopy: none   ?  Conduction: normal   ? ?Medications  ?fentaNYL (SUBLIMAZE) injection 50 mcg (50 mcg Intravenous Given 12/08/21 0544)  ?iohexol (OMNIPAQUE) 300 MG/ML solution 100 mL (100 mLs Intravenous Contrast Given 12/08/21 0618)  ? ? ? ?IMPRESSION / MDM / ASSESSMENT AND PLAN / ED  COURSE  ?I reviewed the triage vital signs and the nursing notes. ? ?88 year old woman presents from home with acute abdominal pain, possibly due to acute pancreatitis.  She has diffuse abdominal tenderness, but no localizing peritoneal features.  Blood work with elevated lipase to the 100s, but reassuring metabolic panel and CBC without pathology.  Negative troponin and EKG is nonischemic.  No signs of ACS.  CT abdomen/pelvis is pending at the time of signout.  Anticipate she will require medical admission due to her continued symptoms and the fact that she lives at home by herself. ? ?Clinical Course as of 12/08/21 0651  ?Fri Dec 08, 2021  ?1219 Reassessed and reexamined.  Reports feeling a little bit better.  Still quite tender on exam.  She just got back from CT.  We discussed pancreatitis. [DS]  ?  ?Clinical Course User Index ?[DS] Vladimir Crofts, MD  ? ? ? ?FINAL CLINICAL IMPRESSION(S) / ED DIAGNOSES  ? ?Final diagnoses:  ?Generalized  abdominal pain  ?Idiopathic acute pancreatitis, unspecified complication status  ? ? ? ?Rx / DC Orders  ? ?ED Discharge Orders   ? ? None  ? ?  ? ? ? ?Note:  This document was prepared using Dragon voice recognition software and may include unintentional dictation errors. ?  ?Vladimir Crofts, MD ?12/08/21 [phone] ? ?

## 2021-12-08 NOTE — Assessment & Plan Note (Addendum)
SBO w/ transition point in the deep left upper quadrant (proximal) d/t adhesions vs enteritis ?Patient has had prior hemicolectomy for colon cancer, also s/p hysterectomy, appendectomy, cholecystectomy ?She has a very proximal SBO therefore abdomen is not significantly distended. ?? IV fluid hydration, antiemetics and IV pain medicationPO ?? NG placed for gastric decompression  ?? General Surgery following: Gastrografin challenge showes contract to rectum, SBO has resolved, pt tolerating liquid diet as of 12/10/2021 and tolerating advancement of diet into 12/11/2021 ?

## 2021-12-08 NOTE — Assessment & Plan Note (Addendum)
Stable ?? IV PPI ?

## 2021-12-08 NOTE — Progress Notes (Signed)
RN provided education to pt regarding the importance of the NG/OG tube. Pt declines the NG/OG tube at this time ?

## 2021-12-09 ENCOUNTER — Encounter: Payer: Self-pay | Admitting: Internal Medicine

## 2021-12-09 ENCOUNTER — Inpatient Hospital Stay: Payer: Medicare Other

## 2021-12-09 DIAGNOSIS — K56609 Unspecified intestinal obstruction, unspecified as to partial versus complete obstruction: Secondary | ICD-10-CM | POA: Diagnosis not present

## 2021-12-09 LAB — BASIC METABOLIC PANEL
Anion gap: 7 (ref 5–15)
BUN: 11 mg/dL (ref 8–23)
CO2: 19 mmol/L — ABNORMAL LOW (ref 22–32)
Calcium: 8.3 mg/dL — ABNORMAL LOW (ref 8.9–10.3)
Chloride: 111 mmol/L (ref 98–111)
Creatinine, Ser: 0.64 mg/dL (ref 0.44–1.00)
GFR, Estimated: >60 mL/min (ref >60)
Glucose, Bld: 88 mg/dL (ref 70–99)
Potassium: 3.6 mmol/L (ref 3.5–5.1)
Sodium: 137 mmol/L (ref 135–145)

## 2021-12-09 LAB — CBC
HCT: 36.5 % (ref 36.0–46.0)
Hemoglobin: 11.8 g/dL — ABNORMAL LOW (ref 12.0–15.0)
MCH: 29.5 pg (ref 26.0–34.0)
MCHC: 32.3 g/dL (ref 30.0–36.0)
MCV: 91.3 fL (ref 80.0–100.0)
Platelets: 272 10*3/uL (ref 150–400)
RBC: 4 MIL/uL (ref 3.87–5.11)
RDW: 13.3 % (ref 11.5–15.5)
WBC: 5.8 10*3/uL (ref 4.0–10.5)
nRBC: 0 % (ref 0.0–0.2)

## 2021-12-09 MED ORDER — TRAMADOL HCL 50 MG PO TABS
50.0000 mg | ORAL_TABLET | Freq: Four times a day (QID) | ORAL | Status: AC | PRN
  Administered 2021-12-10: 50 mg via ORAL
  Filled 2021-12-09: qty 1

## 2021-12-09 MED ORDER — DIATRIZOATE MEGLUMINE & SODIUM 66-10 % PO SOLN
90.0000 mL | Freq: Once | ORAL | Status: AC
  Administered 2021-12-09: 90 mL via NASOGASTRIC

## 2021-12-09 MED ORDER — LIDOCAINE VISCOUS HCL 2 % MT SOLN
5.0000 mL | OROMUCOSAL | Status: DC | PRN
  Administered 2021-12-09: 15 mL via OROMUCOSAL
  Filled 2021-12-09 (×3): qty 15

## 2021-12-09 NOTE — Progress Notes (Signed)
Patient ID: Marilyn Romero, female   DOB: 08-13-1933, 88 y.o.   MRN: [id number] ?    SURGICAL PROGRESS NOTE  ? ?Hospital Day(s): 1.  ? ?Interval History: Patient seen and examined, no acute events or new complaints overnight. Patient reports feeling the same.  She denies any nausea or vomiting.  She denies significant abdominal pain.  She is not passing gas.  No pain radiation.  No alleviating or aggravating factors. ? ?Vital signs in last 24 hours: [min-max] current  ?Temp:  [97.8 ?F (36.6 ?C)-98.7 ?F (37.1 ?C)] 98.2 ?F (36.8 ?C) (05/13 0751) ?Pulse Rate:  [70-80] 71 (05/13 0751) ?Resp:  [16-20] 18 (05/13 0751) ?BP: (146-168)/(64-80) 167/66 (05/13 0751) ?SpO2:  [95 %-100 %] 96 % (05/13 0751)     Height: '5\' 5"'$  (165.1 cm) Weight: 68.3 kg BMI (Calculated): 25.06  ? ?Physical Exam:  ?Constitutional: alert, cooperative and no distress  ?Respiratory: breathing non-labored at rest  ?Cardiovascular: regular rate and sinus rhythm  ?Gastrointestinal: soft, non-tender, and non-distended ? ?Labs:  ? ?  Latest Ref Rng & Units 12/09/2021  ?  5:03 AM 12/08/2021  ?  5:36 AM 05/16/2019  ?  9:59 AM  ?CBC  ?WBC 4.0 - 10.5 K/uL 5.8   8.6   5.2    ?Hemoglobin 12.0 - 15.0 g/dL 11.8   14.2   13.0    ?Hematocrit 36.0 - 46.0 % 36.5   43.5   41.1    ?Platelets 150 - 400 K/uL 272   343   366    ? ? ?  Latest Ref Rng & Units 12/09/2021  ?  5:03 AM 12/08/2021  ?  5:36 AM 05/16/2019  ?  9:59 AM  ?CMP  ?Glucose 70 - 99 mg/dL 88   131   99    ?BUN 8 - 23 mg/dL '11   19   19    '$ ?Creatinine 0.44 - 1.00 mg/dL 0.64   0.73   0.81    ?Sodium 135 - 145 mmol/L 137   141   140    ?Potassium 3.5 - 5.1 mmol/L 3.6   4.0   3.7    ?Chloride 98 - 111 mmol/L 111   106   108    ?CO2 22 - 32 mmol/L '19   23   21    '$ ?Calcium 8.9 - 10.3 mg/dL 8.3   9.7   8.8    ?Total Protein 6.5 - 8.1 g/dL  7.7     ?Total Bilirubin 0.3 - 1.2 mg/dL  1.2     ?Alkaline Phos 38 - 126 U/L  62     ?AST 15 - 41 U/L  23     ?ALT 0 - 44 U/L  19     ? ? ?Imaging studies: No new pertinent imaging  studies ? ? ?Assessment/Plan:  ?88 y.o. female with small bowel obstruction, complicated by pertinent comorbidities including GERD. ? ?-She has a very proximal small bowel obstruction.  This is why her abdomen is not significantly distended. ?-Still not passing gas. ?-We will give Gastrografin challenge for further evaluation of her oral contrast to go through the small intestine to the large intestine. ?-Continue IV hydration ?-Continue conservative management ?-We will follow closely ? ?Gwendolyn Grant, MD ? ? ? ?

## 2021-12-09 NOTE — Progress Notes (Signed)
?PROGRESS NOTE ? ? ? ?Marilyn Romero  HAL:[id number] DOB: 08-04-1933  ?DOA: 12/08/2021 ?Date of Service: 12/09/21 ?PCP: Gayland Curry, MD ? ? ? ? ?Brief Narrative / Hospital Course:  ?Presented to ED 12/08/2021 with chief complaint of abdominal pain starting same day.  Abdominal surgery history includes hemicolectomy for colon cancer, appendectomy, cholecystectomy.  CT showed SBO, transition point deep L UQ, admitted to hospitalist with general surgery consult.  NG placed.  NPO.  IV fluids.  Antiemetics and pain control.  UTI treatment with Rocephin, UCX pending.  IV PPI for history of GERD.  IV hydralazine for hypertension.  12/09/2021, remains n.p.o., NG in place.  General surgery planning for Gastrografin challenge and continue conservative management. ? ?Consultants:  ?General surgery ? ?Procedures: ?None ? ? ? ?Subjective: ?Patient seen and examined resting comfortably in bed on hospital floor, no apparent distress.  She reports discomfort particularly sore throat with NG in place, states abdominal pain is still present but is significantly improved from when she first presented to the ED yesterday. ? ? ? ? ?ASSESSMENT & PLAN: ?  ?Principal Problem: ?  SBO (small bowel obstruction) ([place]) ?Active Problems: ?  GERD (gastroesophageal reflux disease) ?  UTI (urinary tract infection) ?  Essential hypertension ?  Dysphagia ? ? ?UTI (urinary tract infection) ?Present on admission. Patient noted to have significant pyuria ?Rocephin 1 g IV daily ?Follow-up urine culture ? ?SBO (small bowel obstruction) ([place]) ?SBO w/ transition point in the deep left upper quadrant (proximal) d/t adhesions vs enteritis ?Patient has had prior hemicolectomy for colon cancer, also s/p hysterectomy, appendectomy, cholecystectomy ?She has a very proximal SBO therefore abdomen is not significantly distended. ?IV fluid hydration, antiemetics and IV pain medicationPO ?NG placed for gastric decompression  ?General Surgery following: plan for  Gastrografin challenge.  Expect resolve with conservative management. Will conitnue to follow ? ?GERD (gastroesophageal reflux disease) ?Stable ?IV PPI ? ?Essential hypertension ?Blood pressure is uncontrolled ?as needed IV hydralazine ? ?Dysphagia ?Patient complains of dysphagia for both solids and liquids but denies having any significant weight loss ?H&P mentions GI evaluation, no consult order in system, will assess ? ? ? ?DVT prophylaxis: Lovenox ?Code Status: Full code ?Family Communication: None at this time ?Disposition Plan / TOC needs: Remains inpatient due to n.p.o. status, persistent SBP.  Came from home, no TOC needs at this time ?Barriers to discharge / significant pending items: npo, SBO not resolved ? ? ? ? ? ? ? ? ? ? ? ? ?Objective: ?Vitals:  ? 12/08/21 1917 12/09/21 0445 12/09/21 0751 12/09/21 1535  ?BP: (!) 146/64 (!) 153/71 (!) 167/66 (!) 155/71  ?Pulse: 74 70 71 83  ?Resp: '16 16 18 19  '$ ?Temp: 98.7 ?F (37.1 ?C) 97.8 ?F (36.6 ?C) 98.2 ?F (36.8 ?C) (!) 97.5 ?F (36.4 ?C)  ?TempSrc: Oral Oral Oral Oral  ?SpO2: 96% 96% 96% 96%  ?Weight:      ?Height:      ? ? ?Intake/Output Summary (Last 24 hours) at 12/09/2021 1654 ?Last data filed at 12/09/2021 1427 ?Gross per 24 hour  ?Intake 1627.72 ml  ?Output 900 ml  ?Net 727.72 ml  ? ?Filed Weights  ? 12/08/21 0530  ?Weight: 68.3 kg  ? ? ?Examination: ?Constitutional:  ?VS as above ?General Appearance: alert, well-developed, NAD ?Ears, Nose, Mouth, Throat: ?MM dry ?Respiratory: ?Normal respiratory effort ?Breath sounds normal, no wheeze/rhonchi/rales ?Cardiovascular: ?S1/S2 normal, no murmur/rub/gallop auscultated ?No lower extremity edema ?Gastrointestinal: ?Nontender, no masses ?BS WNLx4 ?Musculoskeletal:  ?No clubbing/cyanosis  of digits ?Neurological: ?No cranial nerve deficit on limited exam ?Psychiatric: ?Normal judgment/insight ?Normal mood and affect ? ? ? ? ? ? ?Scheduled Medications:  ? enoxaparin (LOVENOX) injection  40 mg Subcutaneous Q24H  ?  pantoprazole (PROTONIX) IV  40 mg Intravenous Q24H  ? sodium chloride flush  3 mL Intravenous Q12H  ? ? ?Continuous Infusions: ? sodium chloride 75 mL/hr at 12/09/21 1427  ? cefTRIAXone (ROCEPHIN)  IV Stopped (12/09/21 1145)  ? ? ?PRN Medications:  ?acetaminophen **OR** acetaminophen, hydrALAZINE, lidocaine, morphine injection, ondansetron **OR** ondansetron (ZOFRAN) IV ? ?Antimicrobials:  ?Anti-infectives (From admission, onward)  ? ? Start     Dose/Rate Route Frequency Ordered Stop  ? 12/08/21 1200  cefTRIAXone (ROCEPHIN) 1 g in sodium chloride 0.9 % 100 mL IVPB       ? 1 g ?200 mL/hr over 30 Minutes Intravenous Every 24 hours 12/08/21 1101    ? ?  ? ? ?Data Reviewed: I have personally reviewed following labs and imaging studies ? ?CBC: ?Recent Labs  ?Lab 12/08/21 ?5852 12/09/21 ?0503  ?WBC 8.6 5.8  ?NEUTROABS 6.6  --   ?HGB 14.2 11.8*  ?HCT 43.5 36.5  ?MCV 88.4 91.3  ?PLT 343 272  ? ?Basic Metabolic Panel: ?Recent Labs  ?Lab 12/08/21 ?7782 12/09/21 ?0503  ?NA 141 137  ?K 4.0 3.6  ?CL 106 111  ?CO2 23 19*  ?GLUCOSE 131* 88  ?BUN 19 11  ?CREATININE 0.73 0.64  ?CALCIUM 9.7 8.3*  ? ?GFR: ?Estimated Creatinine Clearance: 46.3 mL/min (by C-G formula based on SCr of 0.64 mg/dL). ?Liver Function Tests: ?Recent Labs  ?Lab 12/08/21 ?4235  ?AST 23  ?ALT 19  ?ALKPHOS 62  ?BILITOT 1.2  ?PROT 7.7  ?ALBUMIN 4.5  ? ?Recent Labs  ?Lab 12/08/21 ?3614  ?LIPASE 114*  ? ?No results for input(s): AMMONIA in the last 168 hours. ?Coagulation Profile: ?No results for input(s): INR, PROTIME in the last 168 hours. ?Cardiac Enzymes: ?No results for input(s): CKTOTAL, CKMB, CKMBINDEX, TROPONINI in the last 168 hours. ?BNP (last 3 results) ?No results for input(s): PROBNP in the last 8760 hours. ?HbA1C: ?No results for input(s): HGBA1C in the last 72 hours. ?CBG: ?No results for input(s): GLUCAP in the last 168 hours. ?Lipid Profile: ?No results for input(s): CHOL, HDL, LDLCALC, TRIG, CHOLHDL, LDLDIRECT in the last 72 hours. ?Thyroid Function  Tests: ?No results for input(s): TSH, T4TOTAL, FREET4, T3FREE, THYROIDAB in the last 72 hours. ?Anemia Panel: ?No results for input(s): VITAMINB12, FOLATE, FERRITIN, TIBC, IRON, RETICCTPCT in the last 72 hours. ?Urine analysis: ?   ?Component Value Date/Time  ? COLORURINE YELLOW (A) 12/08/2021 0722  ? APPEARANCEUR CLEAR (A) 12/08/2021 4315  ? APPEARANCEUR Cloudy 10/10/2011 0144  ? LABSPEC >1.046 (H) 12/08/2021 4008  ? LABSPEC 1.012 10/10/2011 0144  ? PHURINE 6.0 12/08/2021 0722  ? GLUCOSEU NEGATIVE 12/08/2021 0722  ? GLUCOSEU Negative 10/10/2011 0144  ? HGBUR MODERATE (A) 12/08/2021 0722  ? [place] NEGATIVE 12/08/2021 0722  ? BILIRUBINUR Negative 10/10/2011 0144  ? KETONESUR 5 (A) 12/08/2021 6761  ? [place] NEGATIVE 12/08/2021 0722  ? NITRITE NEGATIVE 12/08/2021 0722  ? LEUKOCYTESUR LARGE (A) 12/08/2021 0722  ? LEUKOCYTESUR 3+ 10/10/2011 0144  ? ?Sepsis Labs: ?'@LABRCNTIP'$ (procalcitonin:4,lacticidven:4) ? ?No results found for this or any previous visit (from the past 240 hour(s)).  ? ? ? ? ? ?Radiology Studies last 96 hours: ?CT ABDOMEN PELVIS W CONTRAST ? ?Result Date: 12/08/2021 ?CLINICAL DATA:  Acute, diffuse abdominal pain. Urinary frequency for 1 month. Pain radiates  to the left arm EXAM: CT ABDOMEN AND PELVIS WITH CONTRAST TECHNIQUE: Multidetector CT imaging of the abdomen and pelvis was performed using the standard protocol following bolus administration of intravenous contrast. RADIATION DOSE REDUCTION: This exam was performed according to the departmental dose-optimization program which includes automated exposure control, adjustment of the mA and/or kV according to patient size and/or use of iterative reconstruction technique. CONTRAST:  121m OMNIPAQUE IOHEXOL 300 MG/ML  SOLN COMPARISON:  03/01/2017 FINDINGS: Lower chest: No acute finding. Coronary atherosclerosis and mitral annular calcification. Mild subpleural scarring or atelectasis at the bases. Hepatobiliary: Cholecystectomy with chronic biliary  dilatation, although progressed in the liver.No focal liver finding. Pancreas: Unremarkable for age Spleen: Unremarkable. Adrenals/Urinary Tract: Negative adrenals. No hydronephrosis or stone. Right renal cystic

## 2021-12-09 NOTE — Plan of Care (Signed)
  Problem: Nutrition: Goal: Adequate nutrition will be maintained Outcome: Progressing   Problem: Pain Managment: Goal: General experience of comfort will improve Outcome: Progressing   Problem: Safety: Goal: Ability to remain free from injury will improve Outcome: Progressing   

## 2021-12-10 ENCOUNTER — Inpatient Hospital Stay: Payer: Medicare Other

## 2021-12-10 DIAGNOSIS — K56609 Unspecified intestinal obstruction, unspecified as to partial versus complete obstruction: Secondary | ICD-10-CM | POA: Diagnosis not present

## 2021-12-10 DIAGNOSIS — M7122 Synovial cyst of popliteal space [Baker], left knee: Secondary | ICD-10-CM

## 2021-12-10 MED ORDER — OXYCODONE-ACETAMINOPHEN 5-325 MG PO TABS
1.0000 | ORAL_TABLET | ORAL | Status: DC | PRN
  Administered 2021-12-10 – 2021-12-12 (×5): 2 via ORAL
  Filled 2021-12-10 (×5): qty 2

## 2021-12-10 MED ORDER — LOSARTAN POTASSIUM 25 MG PO TABS
25.0000 mg | ORAL_TABLET | Freq: Every day | ORAL | Status: DC
  Administered 2021-12-10 – 2021-12-12 (×3): 25 mg via ORAL
  Filled 2021-12-10 (×3): qty 1

## 2021-12-10 MED ORDER — METHOCARBAMOL 1000 MG/10ML IJ SOLN
500.0000 mg | Freq: Once | INTRAVENOUS | Status: AC
  Administered 2021-12-10: 500 mg via INTRAVENOUS
  Filled 2021-12-10: qty 5

## 2021-12-10 NOTE — Assessment & Plan Note (Signed)
?   Follow w/ ortho outpatient  ?

## 2021-12-10 NOTE — Plan of Care (Addendum)
?  Problem: Health Behavior/Discharge Planning: ?Goal: Ability to manage health-related needs will improve ?Outcome: Not Progressing ?  ?Problem: Pain Managment: ?Goal: General experience of comfort will improve ?Outcome: Not Progressing ?Patient continues to have constant pain in lower left extremity. IV pain medications offering minimal relief to patient.   ?  ?

## 2021-12-10 NOTE — Assessment & Plan Note (Addendum)
Patient reports left lower extremity pain 05/14, distal to knee, described as aching/throbbing/burning and feeling of swelling.  ?? No significant exam findings, x-ray tip/fib, ankle, foot on left are normal.   ?? No DVT but (+)baker's cyst on [place] ?? Pt using prn pai meds ?? Robaxin order placed ?? If neg DVT may be developing shingles given pain description, no rash at this point, monitor skin but bakers cyst could also explain symptoms, recheck in AM ?5/15 complaining of bilateral leg pain, foot pain, hip pain.   ?? PT has evaluated and recommended SNF, placement pending ?

## 2021-12-10 NOTE — Plan of Care (Signed)
Patient is compliant to her treatment. ?

## 2021-12-10 NOTE — Progress Notes (Signed)
Patient ID: Marilyn Romero, female   DOB: 1934/02/01, 88 y.o.   MRN: [id number] ?    SURGICAL PROGRESS NOTE  ? ?Hospital Day(s): 2.  ? ?Interval History: Patient seen and examined, no acute events or new complaints overnight. Patient reports having pain on the left side.  Patient denies any abdominal pain.  She denies any nausea or vomiting.  She endorses she tolerated a clear liquid diet last night.  Pain in the left leg without any alleviating or aggravating factors.  Patient cannot describe how is the pain.  Patient cannot specify the location of the pain.  She just said that the left leg is painful. ? ?Vital signs in last 24 hours: [min-max] current  ?Temp:  [97.5 ?F (36.4 ?C)-98.8 ?F (37.1 ?C)] 97.6 ?F (36.4 ?C) (05/14 0786) ?Pulse Rate:  [75-90] 75 (05/14 0738) ?Resp:  [16-19] 18 (05/14 0738) ?BP: (150-170)/(57-71) 151/66 (05/14 7544) ?SpO2:  [93 %-96 %] 95 % (05/14 0738)     Height: '5\' 5"'$  (165.1 cm) Weight: 68.3 kg BMI (Calculated): 25.06  ? ?Physical Exam:  ?Constitutional: alert, cooperative and no distress  ?Respiratory: breathing non-labored at rest  ?Cardiovascular: regular rate and sinus rhythm  ?Gastrointestinal: soft, non-tender, and non-distended ?Extremity: There is swelling of the left leg.  This is similar to right leg.  Swelling looks to be chronic.  Pain looks to be acute.  No specific tenderness to palpation.  No discoloration. ? ?Labs:  ? ?  Latest Ref Rng & Units 12/09/2021  ?  5:03 AM 12/08/2021  ?  5:36 AM 05/16/2019  ?  9:59 AM  ?CBC  ?WBC 4.0 - 10.5 K/uL 5.8   8.6   5.2    ?Hemoglobin 12.0 - 15.0 g/dL 11.8   14.2   13.0    ?Hematocrit 36.0 - 46.0 % 36.5   43.5   41.1    ?Platelets 150 - 400 K/uL 272   343   366    ? ? ?  Latest Ref Rng & Units 12/09/2021  ?  5:03 AM 12/08/2021  ?  5:36 AM 05/16/2019  ?  9:59 AM  ?CMP  ?Glucose 70 - 99 mg/dL 88   131   99    ?BUN 8 - 23 mg/dL '11   19   19    '$ ?Creatinine 0.44 - 1.00 mg/dL 0.64   0.73   0.81    ?Sodium 135 - 145 mmol/L 137   141   140    ?Potassium  3.5 - 5.1 mmol/L 3.6   4.0   3.7    ?Chloride 98 - 111 mmol/L 111   106   108    ?CO2 22 - 32 mmol/L '19   23   21    '$ ?Calcium 8.9 - 10.3 mg/dL 8.3   9.7   8.8    ?Total Protein 6.5 - 8.1 g/dL  7.7     ?Total Bilirubin 0.3 - 1.2 mg/dL  1.2     ?Alkaline Phos 38 - 126 U/L  62     ?AST 15 - 41 U/L  23     ?ALT 0 - 44 U/L  19     ? ? ?Imaging studies: Gastrografin large intestine.  Normal gas bowel pattern. ? ? ?Assessment/Plan:  ?88 y.o. female with small bowel obstruction, complicated by pertinent comorbidities including GERD. ? ?-She has a very proximal small bowel obstruction.  This is why her abdomen is not significantly distended. ?-Abdominal x-ray shows Gastrografin  completely in large intestine. ?-Small bowel obstruction has resolved ?-Now patient complaining of left lower extremity pain.  Since there is swelling and acute pain I will order venous duplex to rule out DVT.  Further work-up of leg pain deferred to hospitalist. ?-I will advance diet to full liquids.   ? ?Gwendolyn Grant, MD ? ? ? ?

## 2021-12-10 NOTE — Progress Notes (Addendum)
?PROGRESS NOTE ? ? ? ?Marilyn Romero  ZOX:[id number] DOB: March 16, 1934  ?DOA: 12/08/2021 ?Date of Service: 12/10/21 ?PCP: Gayland Curry, MD ? ? ? ? ?Brief Narrative / Hospital Course:  ?Presented to ED 12/08/2021 with chief complaint of abdominal pain starting same day.  Abdominal surgery history includes hemicolectomy for colon cancer, appendectomy, cholecystectomy.  CT showed SBO, transition point deep L UQ, admitted to hospitalist with general surgery consult.  NG placed.  NPO.  IV fluids.  Antiemetics and pain control.  UTI treatment with Rocephin, UCX pending.  IV PPI for history of GERD.  IV hydralazine for hypertension.  12/09/2021, remains n.p.o., NG in place.  General surgery planning for Gastrografin challenge and continue conservative management.  Gastrografin challenge revealed progression of enteric contrast to the level of the rectum, diet was advanced.  12/10/21 patient tolerating soft diet, abdominal pain is reduced, she reports BM/flatus.  At this point, SBO seems to have resolved.  Patient's main complaint is left lower extremity pain, describes as burning/swelling feeling.  There is some mild edema but no pitting, x-rays of left tib/fib, ankle, foot are negative.  DVT study is pending ? ? ?Consultants:  ?General surgery ? ?Procedures: ?None ? ? ? ?Subjective: ?Patient seen and examined resting comfortably in bed on hospital floor, no apparent distress.abdominal pain is reduced, she reports BM/flatus.  Patient's main complaint is left lower extremity pain, describes as burning/swelling feeling.  ? ? ? ? ?ASSESSMENT & PLAN: ?  ?Principal Problem: ?  SBO (small bowel obstruction) ([place]) ?Active Problems: ?  UTI (urinary tract infection) ?  GERD (gastroesophageal reflux disease) ?  Essential hypertension ?  Acute pain of left lower extremity ?  Dysphagia ?  Baker's cyst of knee, left ? ? ?UTI (urinary tract infection) ?Present on admission. Patient noted to have significant pyuria ?Rocephin 1 g IV  daily ?urine culture: Mixed bacteria suggestive of contamination ? ?SBO (small bowel obstruction) ([place]) ?SBO w/ transition point in the deep left upper quadrant (proximal) d/t adhesions vs enteritis ?Patient has had prior hemicolectomy for colon cancer, also s/p hysterectomy, appendectomy, cholecystectomy ?She has a very proximal SBO therefore abdomen is not significantly distended. ?IV fluid hydration, antiemetics and IV pain medicationPO ?NG placed for gastric decompression  ?General Surgery following: Gastrografin challenge showes contract to rectum, SBO has resolved, pt tolerating liquid diet this morning ? ?GERD (gastroesophageal reflux disease) ?Stable ?IV PPI ? ?Essential hypertension ?Blood pressure is uncontrolled ?as needed IV hydralazine ?Tolerating po, adding Losartan today 12/10/21  ? ?Dysphagia ?Patient complains of dysphagia for both solids and liquids but denies having any significant weight loss ?H&P mentions GI evaluation, no consult order in system, will assess ? ?Acute pain of left lower extremity ?Patient reports left lower extremity pain today, distal to knee, described as aching/throbbing/burning and feeling of swelling.  ?No significant exam findings, x-ray tip/fib, ankle, foot on left are normal.   ?No DVT but (+)baker's cyst on [place] ?Pt using prn pai meds ?Robaxin order placed ?If neg DVT may be developing shingles given pain description, no rash at this point, monitor skin but bakers cyst could also explain symptoms, recheck in AM ? ?Baker's cyst of knee, left ?Follow w/ ortho outpatient  ? ? ? ?DVT prophylaxis: Lovenox ?Code Status: Full code ?Family Communication: None at this time ?Disposition Plan / TOC needs:  Came from home, no TOC needs at this time ?Barriers to discharge / significant pending items: if continues to tolerate po may be able to go home  tomorrow 05/15. New onset leg pain to monitor, may need outpatient f/u if DVT study  negative ? ? ? ? ? ? ? ? ? ? ? ? ?Objective: ?Vitals:  ? 12/09/21 1535 12/09/21 1950 12/10/21 0509 12/10/21 0738  ?BP: (!) 155/71 (!) 170/69 (!) 150/57 (!) 151/66  ?Pulse: 83 90 76 75  ?Resp: '19 16 18 18  '$ ?Temp: (!) 97.5 ?F (36.4 ?C) 97.7 ?F (36.5 ?C) 98.8 ?F (37.1 ?C) 97.6 ?F (36.4 ?C)  ?TempSrc: Oral Oral  Oral  ?SpO2: 96% 93% 95% 95%  ?Weight:      ?Height:      ? ? ?Intake/Output Summary (Last 24 hours) at 12/10/2021 1531 ?Last data filed at 12/10/2021 0600 ?Gross per 24 hour  ?Intake 1106.83 ml  ?Output 600 ml  ?Net 506.83 ml  ? ?Filed Weights  ? 12/08/21 0530  ?Weight: 68.3 kg  ? ? ?Examination: ?Constitutional:  ?VS as above ?General Appearance: alert, well-developed, NAD ?Ears, Nose, Mouth, Throat: ?MM dry ?Respiratory: ?Normal respiratory effort ?Breath sounds normal, no wheeze/rhonchi/rales ?Cardiovascular: ?S1/S2 normal, no murmur/rub/gallop auscultated ?No lower extremity edema ?Gastrointestinal: ?Nontender, no masses ?BS WNLx4 ?Musculoskeletal:  ?No clubbing/cyanosis of digits ?Neurological: ?No cranial nerve deficit on limited exam ?Psychiatric: ?Normal judgment/insight ?Normal mood and affect ? ? ? ? ? ? ?Scheduled Medications:  ? enoxaparin (LOVENOX) injection  40 mg Subcutaneous Q24H  ? pantoprazole (PROTONIX) IV  40 mg Intravenous Q24H  ? sodium chloride flush  3 mL Intravenous Q12H  ? ? ?Continuous Infusions: ? sodium chloride 75 mL/hr at 12/10/21 1341  ? cefTRIAXone (ROCEPHIN)  IV 1 g (12/10/21 1259)  ? ? ?PRN Medications:  ?acetaminophen **OR** acetaminophen, hydrALAZINE, lidocaine, morphine injection, ondansetron **OR** ondansetron (ZOFRAN) IV ? ?Antimicrobials:  ?Anti-infectives (From admission, onward)  ? ? Start     Dose/Rate Route Frequency Ordered Stop  ? 12/08/21 1200  cefTRIAXone (ROCEPHIN) 1 g in sodium chloride 0.9 % 100 mL IVPB       ? 1 g ?200 mL/hr over 30 Minutes Intravenous Every 24 hours 12/08/21 1101    ? ?  ? ? ?Data Reviewed: I have personally reviewed following labs and imaging  studies ? ?CBC: ?Recent Labs  ?Lab 12/08/21 ?8295 12/09/21 ?0503  ?WBC 8.6 5.8  ?NEUTROABS 6.6  --   ?HGB 14.2 11.8*  ?HCT 43.5 36.5  ?MCV 88.4 91.3  ?PLT 343 272  ? ?Basic Metabolic Panel: ?Recent Labs  ?Lab 12/08/21 ?6213 12/09/21 ?0503  ?NA 141 137  ?K 4.0 3.6  ?CL 106 111  ?CO2 23 19*  ?GLUCOSE 131* 88  ?BUN 19 11  ?CREATININE 0.73 0.64  ?CALCIUM 9.7 8.3*  ? ?GFR: ?Estimated Creatinine Clearance: 46.3 mL/min (by C-G formula based on SCr of 0.64 mg/dL). ?Liver Function Tests: ?Recent Labs  ?Lab 12/08/21 ?0865  ?AST 23  ?ALT 19  ?ALKPHOS 62  ?BILITOT 1.2  ?PROT 7.7  ?ALBUMIN 4.5  ? ?Recent Labs  ?Lab 12/08/21 ?7846  ?LIPASE 114*  ? ?No results for input(s): AMMONIA in the last 168 hours. ?Coagulation Profile: ?No results for input(s): INR, PROTIME in the last 168 hours. ?Cardiac Enzymes: ?No results for input(s): CKTOTAL, CKMB, CKMBINDEX, TROPONINI in the last 168 hours. ?BNP (last 3 results) ?No results for input(s): PROBNP in the last 8760 hours. ?HbA1C: ?No results for input(s): HGBA1C in the last 72 hours. ?CBG: ?No results for input(s): GLUCAP in the last 168 hours. ?Lipid Profile: ?No results for input(s): CHOL, HDL, LDLCALC, TRIG, CHOLHDL, LDLDIRECT in the last  72 hours. ?Thyroid Function Tests: ?No results for input(s): TSH, T4TOTAL, FREET4, T3FREE, THYROIDAB in the last 72 hours. ?Anemia Panel: ?No results for input(s): VITAMINB12, FOLATE, FERRITIN, TIBC, IRON, RETICCTPCT in the last 72 hours. ?Urine analysis: ?   ?Component Value Date/Time  ? COLORURINE YELLOW (A) 12/08/2021 0722  ? APPEARANCEUR CLEAR (A) 12/08/2021 4920  ? APPEARANCEUR Cloudy 10/10/2011 0144  ? LABSPEC >1.046 (H) 12/08/2021 1007  ? LABSPEC 1.012 10/10/2011 0144  ? PHURINE 6.0 12/08/2021 0722  ? GLUCOSEU NEGATIVE 12/08/2021 0722  ? GLUCOSEU Negative 10/10/2011 0144  ? HGBUR MODERATE (A) 12/08/2021 0722  ? [place] NEGATIVE 12/08/2021 0722  ? BILIRUBINUR Negative 10/10/2011 0144  ? KETONESUR 5 (A) 12/08/2021 1219  ? [place] NEGATIVE  12/08/2021 0722  ? NITRITE NEGATIVE 12/08/2021 0722  ? LEUKOCYTESUR LARGE (A) 12/08/2021 0722  ? LEUKOCYTESUR 3+ 10/10/2011 0144  ? ?Sepsis Labs: ?'@LABRCNTIP'$ (procalcitonin:4,lacticidven:4) ? ?No results found for this or any previous visit (from the past 240

## 2021-12-11 DIAGNOSIS — K56609 Unspecified intestinal obstruction, unspecified as to partial versus complete obstruction: Secondary | ICD-10-CM | POA: Diagnosis not present

## 2021-12-11 MED ORDER — PANTOPRAZOLE SODIUM 40 MG PO TBEC
40.0000 mg | DELAYED_RELEASE_TABLET | Freq: Every day | ORAL | Status: DC
  Administered 2021-12-11 – 2021-12-12 (×2): 40 mg via ORAL
  Filled 2021-12-11 (×2): qty 1

## 2021-12-11 NOTE — Progress Notes (Signed)
Patient ID: Marilyn Romero, female   DOB: 03-19-1934, 88 y.o.   MRN: [id number] ?    SURGICAL PROGRESS NOTE  ? ?Hospital Day(s): 3.  ? ?Interval History: Patient seen and examined, no acute events or new complaints overnight. Patient reports not feeling well.  Patient again was found with teary eyes.  She is complained about pain in her feet.  Today the pain is in both feet.  The pain is with feet flexion.  She endorses that pain is the same on both feet. ? ?Yesterday she was complained about left leg pain.  She had x-rays of the left lower extremity and vascular ultrasound.  Vascular ultrasound negative for DVT.  Baker's cyst was identified.  I personally evaluated the images of the x-rays on the ultrasound. ? ?Patient denies any abdominal pain, nausea or vomiting. ? ?Vital signs in last 24 hours: [min-max] current  ?Temp:  [97.7 ?F (36.5 ?C)-100.2 ?F (37.9 ?C)] 98.4 ?F (36.9 ?C) (05/15 0850) ?Pulse Rate:  [70-88] 82 (05/15 0850) ?Resp:  [18-20] 18 (05/15 0524) ?BP: (131-150)/(51-91) 137/72 (05/15 0850) ?SpO2:  [93 %-97 %] 97 % (05/15 0850)     Height: '5\' 5"'$  (165.1 cm) Weight: 68.3 kg BMI (Calculated): 25.06  ? ?Physical Exam:  ?Constitutional: alert, cooperative and no distress  ?Respiratory: breathing non-labored at rest  ?Cardiovascular: regular rate and sinus rhythm  ?Gastrointestinal: soft, non-tender, and non-distended ?Extremity: Bilateral lower extremity swelling, chronic, no discoloration. ? ?Labs:  ? ?  Latest Ref Rng & Units 12/09/2021  ?  5:03 AM 12/08/2021  ?  5:36 AM 05/16/2019  ?  9:59 AM  ?CBC  ?WBC 4.0 - 10.5 K/uL 5.8   8.6   5.2    ?Hemoglobin 12.0 - 15.0 g/dL 11.8   14.2   13.0    ?Hematocrit 36.0 - 46.0 % 36.5   43.5   41.1    ?Platelets 150 - 400 K/uL 272   343   366    ? ? ?  Latest Ref Rng & Units 12/09/2021  ?  5:03 AM 12/08/2021  ?  5:36 AM 05/16/2019  ?  9:59 AM  ?CMP  ?Glucose 70 - 99 mg/dL 88   131   99    ?BUN 8 - 23 mg/dL '11   19   19    '$ ?Creatinine 0.44 - 1.00 mg/dL 0.64   0.73   0.81     ?Sodium 135 - 145 mmol/L 137   141   140    ?Potassium 3.5 - 5.1 mmol/L 3.6   4.0   3.7    ?Chloride 98 - 111 mmol/L 111   106   108    ?CO2 22 - 32 mmol/L '19   23   21    '$ ?Calcium 8.9 - 10.3 mg/dL 8.3   9.7   8.8    ?Total Protein 6.5 - 8.1 g/dL  7.7     ?Total Bilirubin 0.3 - 1.2 mg/dL  1.2     ?Alkaline Phos 38 - 126 U/L  62     ?AST 15 - 41 U/L  23     ?ALT 0 - 44 U/L  19     ? ? ?Assessment/Plan:  ?88 y.o. female with small bowel obstruction, complicated by pertinent comorbidities including GERD. ?  ?-SBO resolved.  Patient tolerating solid diet.  No nausea or vomiting. ?-I cannot explain patient bilateral feet pain.  I think that this is a chronic issue in combination with  NSAID prolonged.  She is always with teary eyes. ?-From the bowel obstruction standpoint, recent admission she can be discharged home when medically stable.  No general surgery follow-up needed as outpatient. ?-Consider orthopedic outpatient referral for Baker's cyst evaluation. ?-Patient is a pain clinic patient, may consider pain clinic outpatient follow-up. ? ?Gwendolyn Grant, MD ? ? ? ?

## 2021-12-11 NOTE — Care Management Important Message (Signed)
Important Message ? ?Patient Details  ?Name: Marilyn Romero ?MRN: [id number] ?Date of Birth: 02-22-1934 ? ? ?Medicare Important Message Given:  Yes ? ? ? ? ?Dannette Barbara ?12/11/2021, 10:00 AM ?

## 2021-12-11 NOTE — Progress Notes (Signed)
?PROGRESS NOTE ? ? ? ?Marilyn Romero  ZTI:[id number] DOB: 10-30-1933  ?DOA: 12/08/2021 ?Date of Service: 12/11/21 ?PCP: Gayland Curry, MD ? ? ? ? ?Brief Narrative / Hospital Course:  ?Presented to ED 12/08/2021 with chief complaint of abdominal pain starting same day.  Abdominal surgery history includes hemicolectomy for colon cancer, appendectomy, cholecystectomy.  CT showed SBO, transition point deep L UQ, admitted to hospitalist with general surgery consult.  NG placed.  NPO.  IV fluids.  Antiemetics and pain control.  UTI treatment with Rocephin, UCX pending.  IV PPI for history of GERD.  IV hydralazine for hypertension.   ?12/09/2021, remains n.p.o., NG in place.  General surgery planning for Gastrografin challenge and continue conservative management.  Gastrografin challenge revealed progression of enteric contrast to the level of the rectum, diet was advanced.   ?12/10/21 patient tolerating soft diet, abdominal pain is reduced, she reports BM/flatus.  At this point, SBO seems to have resolved.  Patient's main complaint is left lower extremity pain, describes as burning/swelling feeling.  There is some mild edema but no pitting, x-rays of left tib/fib, ankle, foot are negative.  DVT study negative for DVT but there is large Baker's cyst.  ?12/11/21 continuing to tolerate po but c/o MSK pain. PT evaluated and recommending SNF. awaiting placement.  ? ? ?Consultants:  ?General surgery ? ?Procedures: ?None ? ? ? ?Subjective: ?Patient seen and examined resting comfortably in bed on hospital floor, she is tearful talking about her leg and foot pain, reports her abdominal pain is reduced, she reports BM yesterday and (+)flatus throughout the evening and into today.  Patient's main complaint is bilateral lower extremity pain.  ? ? ? ? ?ASSESSMENT & PLAN: ?  ?Principal Problem: ?  SBO (small bowel obstruction) ([place]) ?Active Problems: ?  UTI (urinary tract infection) ?  GERD (gastroesophageal reflux disease) ?  Essential  hypertension ?  Musculoskeletal pain ?  Dysphagia ?  Baker's cyst of knee, left ? ? ?UTI (urinary tract infection) ?Present on admission. Patient noted to have significant pyuria ?Rocephin 1 g IV daily ?urine culture: Mixed bacteria suggestive of contamination ? ?SBO (small bowel obstruction) ([place]) ?SBO w/ transition point in the deep left upper quadrant (proximal) d/t adhesions vs enteritis ?Patient has had prior hemicolectomy for colon cancer, also s/p hysterectomy, appendectomy, cholecystectomy ?She has a very proximal SBO therefore abdomen is not significantly distended. ?IV fluid hydration, antiemetics and IV pain medicationPO ?NG placed for gastric decompression  ?General Surgery following: Gastrografin challenge showes contract to rectum, SBO has resolved, pt tolerating liquid diet as of 12/10/2021 and tolerating advancement of diet into 12/11/2021 ? ?GERD (gastroesophageal reflux disease) ?Stable ?IV PPI ? ?Essential hypertension ?Blood pressure is uncontrolled ?as needed IV hydralazine ?Tolerating po, adding Losartan today 12/10/21  ? ?Dysphagia ?Patient complains of dysphagia for both solids and liquids but denies having any significant weight loss ?H&P mentions GI evaluation, no consult order in system, will assess ? ?Musculoskeletal pain ?Patient reports left lower extremity pain 05/14, distal to knee, described as aching/throbbing/burning and feeling of swelling.  ?No significant exam findings, x-ray tip/fib, ankle, foot on left are normal.   ?No DVT but (+)baker's cyst on [place] ?Pt using prn pai meds ?Robaxin order placed ?If neg DVT may be developing shingles given pain description, no rash at this point, monitor skin but bakers cyst could also explain symptoms, recheck in AM ?5/15 complaining of bilateral leg pain, foot pain, hip pain.   ?PT has evaluated and recommended SNF,  placement pending ? ?Baker's cyst of knee, left ?Follow w/ ortho outpatient  ? ? ? ?DVT prophylaxis: Lovenox ?Code Status: Full  code ?Family Communication: None at this time ?Disposition Plan / TOC needs:  pending SNF placement  ?Barriers to discharge / significant pending items: SNF placement hopefully can be d/c 05/16 ? ? ? ? ? ? ? ? ? ? ? ? ?Objective: ?Vitals:  ? 12/10/21 1605 12/10/21 1957 12/11/21 0524 12/11/21 0850  ?BP: (!) 150/56 (!) 139/91 (!) 131/51 137/72  ?Pulse: 83 88 70 82  ?Resp: '18 20 18   '$ ?Temp: 98.2 ?F (36.8 ?C) 100.2 ?F (37.9 ?C) 97.7 ?F (36.5 ?C) 98.4 ?F (36.9 ?C)  ?TempSrc: Oral Oral Oral   ?SpO2: 93% 96% 96% 97%  ?Weight:      ?Height:      ? ? ?Intake/Output Summary (Last 24 hours) at 12/11/2021 1424 ?Last data filed at 12/10/2021 1600 ?Gross per 24 hour  ?Intake 878.87 ml  ?Output --  ?Net 878.87 ml  ? ?Filed Weights  ? 12/08/21 0530  ?Weight: 68.3 kg  ? ? ?Examination: ?Constitutional:  ?VS as above ?General Appearance: alert, well-developed, NAD ?Ears, Nose, Mouth, Throat: ?MM dry ?Respiratory: ?Normal respiratory effort ?Breath sounds normal, no wheeze/rhonchi/rales ?Cardiovascular: ?S1/S2 normal, no murmur/rub/gallop auscultated ?No lower extremity edema ?Gastrointestinal: ?Nontender, no masses ?BS WNLx4 ?Musculoskeletal:  ?No clubbing/cyanosis of digits ?No edema LE ?Normal passive ROM knees, ankles, hips ?Neurological: ?No cranial nerve deficit on limited exam ?Psychiatric: ?Normal judgment/insight ?Depressed mood and affect ? ? ? ? ? ? ?Scheduled Medications:  ? enoxaparin (LOVENOX) injection  40 mg Subcutaneous Q24H  ? losartan  25 mg Oral Daily  ? pantoprazole  40 mg Oral Daily  ? sodium chloride flush  3 mL Intravenous Q12H  ? ? ?Continuous Infusions: ? ? ? ?PRN Medications:  ?acetaminophen **OR** acetaminophen, hydrALAZINE, lidocaine, morphine injection, ondansetron **OR** ondansetron (ZOFRAN) IV, oxyCODONE-acetaminophen ? ?Antimicrobials:  ?Anti-infectives (From admission, onward)  ? ? Start     Dose/Rate Route Frequency Ordered Stop  ? 12/08/21 1200  cefTRIAXone (ROCEPHIN) 1 g in sodium chloride 0.9 % 100  mL IVPB  Status:  Discontinued       ? 1 g ?200 mL/hr over 30 Minutes Intravenous Every 24 hours 12/08/21 1101 12/11/21 1121  ? ?  ? ? ?Data Reviewed: I have personally reviewed following labs and imaging studies ? ?CBC: ?Recent Labs  ?Lab 12/08/21 ?4098 12/09/21 ?0503  ?WBC 8.6 5.8  ?NEUTROABS 6.6  --   ?HGB 14.2 11.8*  ?HCT 43.5 36.5  ?MCV 88.4 91.3  ?PLT 343 272  ? ?Basic Metabolic Panel: ?Recent Labs  ?Lab 12/08/21 ?1191 12/09/21 ?0503  ?NA 141 137  ?K 4.0 3.6  ?CL 106 111  ?CO2 23 19*  ?GLUCOSE 131* 88  ?BUN 19 11  ?CREATININE 0.73 0.64  ?CALCIUM 9.7 8.3*  ? ?GFR: ?Estimated Creatinine Clearance: 46.3 mL/min (by C-G formula based on SCr of 0.64 mg/dL). ?Liver Function Tests: ?Recent Labs  ?Lab 12/08/21 ?4782  ?AST 23  ?ALT 19  ?ALKPHOS 62  ?BILITOT 1.2  ?PROT 7.7  ?ALBUMIN 4.5  ? ?Recent Labs  ?Lab 12/08/21 ?9562  ?LIPASE 114*  ? ?No results for input(s): AMMONIA in the last 168 hours. ?Coagulation Profile: ?No results for input(s): INR, PROTIME in the last 168 hours. ?Cardiac Enzymes: ?No results for input(s): CKTOTAL, CKMB, CKMBINDEX, TROPONINI in the last 168 hours. ?BNP (last 3 results) ?No results for input(s): PROBNP in the last 8760 hours. ?HbA1C: ?  No results for input(s): HGBA1C in the last 72 hours. ?CBG: ?No results for input(s): GLUCAP in the last 168 hours. ?Lipid Profile: ?No results for input(s): CHOL, HDL, LDLCALC, TRIG, CHOLHDL, LDLDIRECT in the last 72 hours. ?Thyroid Function Tests: ?No results for input(s): TSH, T4TOTAL, FREET4, T3FREE, THYROIDAB in the last 72 hours. ?Anemia Panel: ?No results for input(s): VITAMINB12, FOLATE, FERRITIN, TIBC, IRON, RETICCTPCT in the last 72 hours. ?Urine analysis: ?   ?Component Value Date/Time  ? COLORURINE YELLOW (A) 12/08/2021 0722  ? APPEARANCEUR CLEAR (A) 12/08/2021 8675  ? APPEARANCEUR Cloudy 10/10/2011 0144  ? LABSPEC >1.046 (H) 12/08/2021 4492  ? LABSPEC 1.012 10/10/2011 0144  ? PHURINE 6.0 12/08/2021 0722  ? GLUCOSEU NEGATIVE 12/08/2021 0722  ?  GLUCOSEU Negative 10/10/2011 0144  ? HGBUR MODERATE (A) 12/08/2021 0722  ? [place] NEGATIVE 12/08/2021 0722  ? BILIRUBINUR Negative 10/10/2011 0144  ? KETONESUR 5 (A) 12/08/2021 0100  ? PROTEINUR NEGATI

## 2021-12-11 NOTE — TOC Initial Note (Signed)
Transition of Care (TOC) - Initial/Assessment Note  ? ? ?Patient Details  ?Name: Marilyn Romero ?MRN: [id number] ?Date of Birth: 1933/12/13 ? ?Transition of Care (TOC) CM/SW Contact:    ?Candie Chroman, LCSW ?Phone Number: ?12/11/2021, 2:32 PM ? ?Clinical Narrative: CSW met with patient. No supports at bedside. CSW introduced role and explained that PT recommendations would be discussed. Patient is agreeable to SNF placement. Gave CMS scores for facilities within 25 miles of her zip code. Will follow up once bed offers are available. No further concerns. CSW encouraged patient to contact CSW as needed. CSW will continue to follow patient for support and facilitate discharge to SNF once medically stable.                ? ?Expected Discharge Plan: [place] ?Barriers to Discharge: Continued Medical Work up ? ? ?Patient Goals and CMS Choice ?  ?CMS Medicare.gov Compare Post Acute Care list provided to:: Patient ?  ? ?Expected Discharge Plan and Services ?Expected Discharge Plan: [place] ?  ?  ?Post Acute Care Choice: [place] ?Living arrangements for the past 2 months: Apartment ?                ?  ?  ?  ?  ?  ?  ?  ?  ?  ?  ? ?Prior Living Arrangements/Services ?Living arrangements for the past 2 months: Apartment ?Lives with:: Self ?Patient language and need for interpreter reviewed:: Yes ?Do you feel safe going back to the place where you live?: Yes      ?Need for Family Participation in Patient Care: Yes (Comment) ?Care giver support system in place?: Yes (comment) ?  ?Criminal Activity/Legal Involvement Pertinent to Current Situation/Hospitalization: No - Comment as needed ? ?Activities of Daily Living ?Home Assistive Devices/Equipment: Gilford Rile (specify type), Eyeglasses ?ADL Screening (condition at time of admission) ?Patient's cognitive ability adequate to safely complete daily activities?: Yes ?Is the patient deaf or have difficulty hearing?: No ?Does the patient have  difficulty seeing, even when wearing glasses/contacts?: No ?Does the patient have difficulty concentrating, remembering, or making decisions?: No ?Patient able to express need for assistance with ADLs?: Yes ?Does the patient have difficulty dressing or bathing?: No ?Independently performs ADLs?: Yes (appropriate for developmental age) ?Does the patient have difficulty walking or climbing stairs?: No ?Weakness of Legs: None ?Weakness of Arms/Hands: None ? ?Permission Sought/Granted ?Permission sought to share information with : Customer service manager ?Permission granted to share information with : Yes, Verbal Permission Granted ?   ? Permission granted to share info w AGENCY: SNF's ?   ?   ? ?Emotional Assessment ?Appearance:: Appears stated age ?Attitude/Demeanor/Rapport: Engaged, Gracious ?Affect (typically observed): Accepting, Appropriate, Calm, Pleasant ?Orientation: : Oriented to Self, Oriented to Place, Oriented to  Time, Oriented to Situation ?Alcohol / Substance Use: Not Applicable ?Psych Involvement: No (comment) ? ?Admission diagnosis:  Small bowel obstruction ([place]) [K56.609] ?SBO (small bowel obstruction) ([place]) [K56.609] ?Generalized abdominal pain [R10.84] ?Idiopathic acute pancreatitis, unspecified complication status [Y65.03] ?Patient Active Problem List  ? Diagnosis Date Noted  ? Baker's cyst of knee, left 12/10/2021  ? SBO (small bowel obstruction) ([place]) 12/08/2021  ? UTI (urinary tract infection) 12/08/2021  ? Essential hypertension 12/08/2021  ? Dysphagia 12/08/2021  ? Chronic pain syndrome 01/10/2021  ? Personal history of colon cancer   ? Abdominal pain, epigastric   ? Acute peptic ulcer of stomach   ? Gastritis without bleeding   ? Insomnia  06/18/2016  ? Musculoskeletal pain 10/04/2015  ? Lumbar radiculopathy 10/04/2015  ? Status post total replacement of right hip 08/15/2015  ? Primary osteoarthritis of one hip 08/15/2015  ? Pain due to total hip replacement ([place]) 08/15/2015  ? DDD  (degenerative disc disease), lumbar 12/16/2014  ? Facet syndrome, lumbar 12/16/2014  ? Sacroiliac joint dysfunction of both sides 12/16/2014  ? Cancer of sigmoid colon ([place]) 12/08/2014  ? Pure hypercholesterolemia 08/10/2013  ? Colonic mass 10/07/2012  ? GERD (gastroesophageal reflux disease) 05/26/2012  ? Lesion of bladder 04/29/2012  ? Atrophic vaginitis 04/29/2012  ? Dysuria 04/17/2012  ? Diverticulitis 06/27/2011  ? ?PCP:  Gayland Curry, MD ?Pharmacy:   ?[facility] [place], [place] AT [facility] [address] ?[place] ?[place] [address] ?Phone: [phone] Fax: [phone] ? ? ? ? ?Social Determinants of Health (SDOH) Interventions ?  ? ?Readmission Risk Interventions ?   ? View : No data to display.  ?  ?  ?  ? ? ? ?

## 2021-12-11 NOTE — Progress Notes (Signed)
Mobility Specialist - Progress Note ? ? 12/11/21 1100  ?Mobility  ?Activity Ambulated with assistance in room;Transferred to/from [facility];Transferred from bed to chair  ?Level of Assistance Standby assist, set-up cues, supervision of patient - no hands on  ?Assistive Device Front wheel walker  ?Distance Ambulated (ft) 6 ft  ?Activity Response Tolerated well  ?$Mobility charge 1 Mobility  ? ? ? ?Pt lying in bed upon arrival, utilizing RA. Pt initially denied pain. Able to complete bed mobility modI. Voiced 8/10 pain in B feet on first STS attempt; cued to bear weight through UE to offset pain. Pt transferred to [facility] for urinal output prior to ambulation to recliner. Some pain relief once LE elevated. Pt left in chair with alarm set, needs in reach.  ? ? ?Kathee Delton ?Mobility Specialist ?12/11/21, 11:53 AM ? ? ? ? ?

## 2021-12-11 NOTE — Evaluation (Signed)
Physical Therapy Evaluation ?Patient Details ?Name: Marilyn Romero ?MRN: [id number] ?DOB: 1933/09/08 ?Today's Date: 12/11/2021 ? ?History of Present Illness ? Pt is an 88 y/o F admitted on 12/08/21 after presenting with c/c of abdominal pain. CT showed SBO, transition point deep LUQ. NG was placed for gastric decompression. PMH: hemicolectomy for colon CA, appendectomy, cholecystectomy  ?Clinical Impression ? Pt seen for PT evaluation with pt received in recliner. Pt reports prior to admission she was living alone, independent, riding a bike ~2 weeks ago but also endorses occasional use of RW as needed. On this date pt endorses significant BLE ankle pain but does attempt STS with min assist & attempts to take 1 step forward but experiences increasing pain. Pt reports her pain may improve slightly with improved upright standing posture.   Pt declines feeling "stiff" 2/2 lying in bed for a few days. At this time pt is unsafe to d/c home alone & would benefit from STR upon d/c to maximize independence with functional mobility & reduce fall risk prior to return home. ? ?   ? ?Recommendations for follow up therapy are one component of a multi-disciplinary discharge planning process, led by the attending physician.  Recommendations may be updated based on patient status, additional functional criteria and insurance authorization. ? ?Follow Up Recommendations Skilled nursing-short term rehab (<3 hours/day) ? ?  ?Assistance Recommended at Discharge Frequent or constant Supervision/Assistance  ?Patient can return home with the following ? A lot of help with walking and/or transfers;A lot of help with bathing/dressing/bathroom;Assistance with cooking/housework;Help with stairs or ramp for entrance;Direct supervision/assist for medications management ? ?  ?Equipment Recommendations Rolling walker (2 wheels);BSC/3in1  ?Recommendations for Other Services ?    ?  ?Functional Status Assessment Patient has had a recent decline in their  functional status and demonstrates the ability to make significant improvements in function in a reasonable and predictable amount of time.  ? ?  ?Precautions / Restrictions Precautions ?Precautions: Fall ?Restrictions ?Weight Bearing Restrictions: No  ? ?  ? ?Mobility ? Bed Mobility ?  ?  ?  ?  ?  ?  ?  ?General bed mobility comments: not observed, pt received & left sitting in recliner at end of session ?  ? ?Transfers ?Overall transfer level: Needs assistance ?Equipment used: Rolling walker (2 wheels) ?Transfers: Sit to/from Stand ?Sit to Stand: Min assist ?  ?  ?  ?  ?  ?General transfer comment: cuing for safe hand placement ?  ? ?Ambulation/Gait ?  ?  ?  ?  ?  ?  ?  ?  ? ?Stairs ?  ?  ?  ?  ?  ? ?Wheelchair Mobility ?  ? ?Modified Rankin (Stroke Patients Only) ?  ? ?  ? ?Balance Overall balance assessment: Needs assistance ?Sitting-balance support: Feet supported ?Sitting balance-Leahy Scale: Fair ?  ?  ?Standing balance support: During functional activity, Reliant on assistive device for balance ?Standing balance-Leahy Scale: Fair ?  ?  ?  ?  ?  ?  ?  ?  ?  ?  ?  ?  ?   ? ? ? ?Pertinent Vitals/Pain Pain Assessment ?Pain Assessment: Faces ?Faces Pain Scale: Hurts whole lot ?Pain Location: BLE ankles & once standing pt radiates up to posterior thigh ?Pain Descriptors / Indicators: Discomfort, Crying, Grimacing ?Pain Intervention(s): Monitored during session, Repositioned  ? ? ?Home Living Family/patient expects to be discharged to:: Private residence ?Living Arrangements: Alone ?Available Help at Discharge: Family;Available PRN/intermittently ?Type of  Home: Apartment ?Home Access: Stairs to enter ?Entrance Stairs-Rails: Right ?Entrance Stairs-Number of Steps: 5 ?  ?Home Layout: One level ?Home Equipment: Conservation officer, nature (2 wheels) ?   ?  ?Prior Function Prior Level of Function : Independent/Modified Independent;Driving ?  ?  ?  ?  ?  ?  ?Mobility Comments: Pt reports she's independent with mobility, reporting  she was on a bicycle 2 weeks ago but then also states she uses a RW occasionally PRN. Pt denies falls in past 6 months. ?  ?  ? ? ?Hand Dominance  ?   ? ?  ?Extremity/Trunk Assessment  ? Upper Extremity Assessment ?Upper Extremity Assessment: Overall WFL for tasks assessed ?  ? ?Lower Extremity Assessment ?Lower Extremity Assessment:  (BLE knee AROM WNL, ankle AROM limited by pain, MMT not formally performed 2/2 c/o pain) ?  ? ?   ?Communication  ? Communication: HOH  ?Cognition Arousal/Alertness: Awake/alert ?Behavior During Therapy: [facility] for tasks assessed/performed ?Overall Cognitive Status: Within Functional Limits for tasks assessed ?  ?  ?  ?  ?  ?  ?  ?  ?  ?  ?  ?  ?  ?  ?  ?  ?  ?  ?  ? ?  ?General Comments   ? ?  ?Exercises    ? ?Assessment/Plan  ?  ?PT Assessment Patient needs continued PT services  ?PT Problem List Decreased strength;Decreased mobility;Decreased range of motion;Decreased activity tolerance;Pain;Decreased balance;Decreased knowledge of use of DME ? ?   ?  ?PT Treatment Interventions DME instruction;Therapeutic activities;Gait training;Therapeutic exercise;Patient/family education;Modalities;Stair training;Balance training;Functional mobility training;Manual techniques   ? ?PT Goals (Current goals can be found in the Care Plan section)  ?Acute Rehab PT Goals ?Patient Stated Goal: decreased pain, determine cause of pain ?PT Goal Formulation: With patient ?Time For Goal Achievement: 12/25/21 ?Potential to Achieve Goals: Good ? ?  ?Frequency Min 2X/week ?  ? ? ?Co-evaluation   ?  ?  ?  ?  ? ? ?  ?AM-PAC PT "6 Clicks" Mobility  ?Outcome Measure Help needed turning from your back to your side while in a flat bed without using bedrails?: A Little ?Help needed moving from lying on your back to sitting on the side of a flat bed without using bedrails?: A Little ?Help needed moving to and from a bed to a chair (including a wheelchair)?: A Lot ?Help needed standing up from a chair using your arms  (e.g., wheelchair or bedside chair)?: A Little ?Help needed to walk in hospital room?: A Lot ?Help needed climbing 3-5 steps with a railing? : Total ?6 Click Score: 14 ? ?  ?End of Session   ?Activity Tolerance: Patient limited by pain ?Patient left: with chair alarm set;with call bell/phone within reach;in chair ?Nurse Communication: Mobility status ?PT Visit Diagnosis: Difficulty in walking, not elsewhere classified (R26.2);Muscle weakness (generalized) (M62.81);Pain ?Pain - Right/Left:  (bilateral) ?Pain - part of body: Ankle and joints of foot;Leg ?  ? ?Time: 0300-9233 ?PT Time Calculation (min) (ACUTE ONLY): 11 min ? ? ?Charges:   PT Evaluation ?$PT Eval Low Complexity: 1 Low ?  ?  ?   ? ? ?Lavone Nian, PT, DPT ?12/11/21, 12:26 PM ? ? ?Waunita Schooner ?12/11/2021, 12:22 PM ? ?

## 2021-12-11 NOTE — [place] FL2 (Signed)
?[place] MEDICAID FL2 LEVEL OF CARE SCREENING TOOL  ?  ? ?IDENTIFICATION  ?Patient Name: ?Marilyn Romero Birthdate: 12-21-1933 Sex: female Admission Date (Current Location): ?12/08/2021  ?[place] and [place] Number: ? [place] ?  Facility and Address:  ?[facility], [address], [place], [place] [address] ?     Provider Number: ?[id number]  ?Attending Physician Name and Address:  ?Emeterio Reeve, DO ? Relative Name and Phone Number:  ?  ?   ?Current Level of Care: ?Hospital Recommended Level of Care: ?[place] Prior Approval Number: ?  ? ?Date Approved/Denied: ?  PASRR Number: ?[id number] A ? ?Discharge Plan: ?SNF ?  ? ?Current Diagnoses: ?Patient Active Problem List  ? Diagnosis Date Noted  ? Baker's cyst of knee, left 12/10/2021  ? SBO (small bowel obstruction) ([place]) 12/08/2021  ? UTI (urinary tract infection) 12/08/2021  ? Essential hypertension 12/08/2021  ? Dysphagia 12/08/2021  ? Chronic pain syndrome 01/10/2021  ? Personal history of colon cancer   ? Abdominal pain, epigastric   ? Acute peptic ulcer of stomach   ? Gastritis without bleeding   ? Insomnia 06/18/2016  ? Musculoskeletal pain 10/04/2015  ? Lumbar radiculopathy 10/04/2015  ? Status post total replacement of right hip 08/15/2015  ? Primary osteoarthritis of one hip 08/15/2015  ? Pain due to total hip replacement ([place]) 08/15/2015  ? DDD (degenerative disc disease), lumbar 12/16/2014  ? Facet syndrome, lumbar 12/16/2014  ? Sacroiliac joint dysfunction of both sides 12/16/2014  ? Cancer of sigmoid colon ([place]) 12/08/2014  ? Pure hypercholesterolemia 08/10/2013  ? Colonic mass 10/07/2012  ? GERD (gastroesophageal reflux disease) 05/26/2012  ? Lesion of bladder 04/29/2012  ? Atrophic vaginitis 04/29/2012  ? Dysuria 04/17/2012  ? Diverticulitis 06/27/2011  ? ? ?Orientation RESPIRATION BLADDER Height & Weight   ?  ?Self, Time, Situation, Place ? Normal Continent Weight: 150 lb 9.2 oz (68.3 kg) ?Height:  '5\' 5"'$   (165.1 cm)  ?BEHAVIORAL SYMPTOMS/MOOD NEUROLOGICAL BOWEL NUTRITION STATUS  ? (None)  (None) Continent Diet (Soft)  ?AMBULATORY STATUS COMMUNICATION OF NEEDS Skin   ?Extensive Assist Verbally Bruising ?  ?  ?  ?    ?     ?     ? ? ?Personal Care Assistance Level of Assistance  ?    ?  ?  ?   ? ?Functional Limitations Info  ?Sight, Hearing, Speech Sight Info: Adequate ?Hearing Info: Adequate ?Speech Info: Adequate  ? ? ?SPECIAL CARE FACTORS FREQUENCY  ?PT (By licensed PT)   ?  ?PT Frequency: 5 x week ?  ?  ?  ?  ?   ? ? ?Contractures Contractures Info: Not present  ? ? ?Additional Factors Info  ?Code Status, Allergies Code Status Info: Full code ?Allergies Info: NKDA ?  ?  ?  ?   ? ?Current Medications (12/11/2021):  This is the current hospital active medication list ?Current Facility-Administered Medications  ?Medication Dose Route Frequency Provider Last Rate Last Admin  ? acetaminophen (TYLENOL) tablet 650 mg  650 mg Oral Q6H PRN Agbata, Tochukwu, MD   650 mg at 12/10/21 0004  ? Or  ? acetaminophen (TYLENOL) suppository 650 mg  650 mg Rectal Q6H PRN Agbata, Tochukwu, MD      ? enoxaparin (LOVENOX) injection 40 mg  40 mg Subcutaneous Q24H Agbata, Tochukwu, MD   40 mg at 12/10/21 2142  ? hydrALAZINE (APRESOLINE) injection 5 mg  5 mg Intravenous Q6H PRN Collier Bullock, MD      ?  lidocaine (XYLOCAINE) 2 % viscous mouth solution 5-15 mL  5-15 mL Mouth/Throat Q3H PRN Emeterio Reeve, DO   15 mL at 12/09/21 1520  ? losartan (COZAAR) tablet 25 mg  25 mg Oral Daily Emeterio Reeve, DO   25 mg at 12/11/21 0848  ? morphine (PF) 2 MG/ML injection 2 mg  2 mg Intravenous Q4H PRN Agbata, Tochukwu, MD   2 mg at 12/10/21 2143  ? ondansetron (ZOFRAN) tablet 4 mg  4 mg Oral Q6H PRN Agbata, Tochukwu, MD      ? Or  ? ondansetron (ZOFRAN) injection 4 mg  4 mg Intravenous Q6H PRN Agbata, Tochukwu, MD   4 mg at 12/09/21 1817  ? oxyCODONE-acetaminophen (PERCOCET/ROXICET) 5-325 MG per tablet 1-2 tablet  1-2 tablet Oral Q4H PRN  Emeterio Reeve, DO   2 tablet at 12/11/21 0848  ? pantoprazole (PROTONIX) EC tablet 40 mg  40 mg Oral Daily Dorothe Pea, RPH   40 mg at 12/11/21 1225  ? sodium chloride flush (NS) 0.9 % injection 3 mL  3 mL Intravenous Q12H Agbata, Tochukwu, MD   3 mL at 12/11/21 1225  ? ? ? ?Discharge Medications: ?Please see discharge summary for a list of discharge medications. ? ?Relevant Imaging Results: ? ?Relevant Lab Results: ? ? ?Additional Information ?SS#: [id number] ? ?Candie Chroman, LCSW ? ? ? ? ?

## 2021-12-12 DIAGNOSIS — K56609 Unspecified intestinal obstruction, unspecified as to partial versus complete obstruction: Secondary | ICD-10-CM | POA: Diagnosis not present

## 2021-12-12 MED ORDER — TRAMADOL HCL 50 MG PO TABS: 50.0000 mg | ORAL_TABLET | Freq: Two times a day (BID) | ORAL | 0 refills | Status: DC | PRN

## 2021-12-12 NOTE — Progress Notes (Signed)
Pt discharged per MD order. IV removed. Report called to [facility]. Pt transported via EMS.  ?

## 2021-12-12 NOTE — Progress Notes (Signed)
Physical Therapy Treatment ?Patient Details ?Name: Marilyn Romero ?MRN: [id number] ?DOB: Sep 03, 1933 ?Today's Date: 12/12/2021 ? ? ?History of Present Illness Pt is an 88 y/o F admitted on 12/08/21 after presenting with c/c of abdominal pain. CT showed SBO, transition point deep LUQ. NG was placed for gastric decompression. PMH: hemicolectomy for colon CA, appendectomy, cholecystectomy ? ?  ?PT Comments  ? ? Pt to EOB with ease.  Stands to RW with min a x 1 and takes several hesitant steps to recliner.  After seated rest, stands again for limited standing AROM and static time standing.  Fatigues quickly which limits further progression of mobility.  Remained in recliner after session. ?  ?Recommendations for follow up therapy are one component of a multi-disciplinary discharge planning process, led by the attending physician.  Recommendations may be updated based on patient status, additional functional criteria and insurance authorization. ? ?Follow Up Recommendations ? Skilled nursing-short term rehab (<3 hours/day) ?  ?  ?Assistance Recommended at Discharge Frequent or constant Supervision/Assistance  ?Patient can return home with the following Assistance with cooking/housework;Help with stairs or ramp for entrance;A little help with walking and/or transfers;A little help with bathing/dressing/bathroom;Assist for transportation ?  ?Equipment Recommendations ? Rolling walker (2 wheels);BSC/3in1  ?  ?Recommendations for Other Services   ? ? ?  ?Precautions / Restrictions Precautions ?Precautions: Fall ?Restrictions ?Weight Bearing Restrictions: No  ?  ? ?Mobility ? Bed Mobility ?Overal bed mobility: Needs Assistance ?Bed Mobility: Supine to Sit ?  ?  ?Supine to sit: Supervision ?  ?  ?  ?  ? ?Transfers ?Overall transfer level: Needs assistance ?Equipment used: Rolling walker (2 wheels) ?Transfers: Sit to/from Stand ?Sit to Stand: Min assist ?  ?  ?  ?  ?  ?  ?  ? ?Ambulation/Gait ?Ambulation/Gait assistance: Min  assist ?Gait Distance (Feet): 3 Feet ?Assistive device: Rolling walker (2 wheels) ?Gait Pattern/deviations: Step-through pattern, Decreased step length - right, Decreased step length - left ?Gait velocity: dec ?  ?  ?  ? ? ?Stairs ?  ?  ?  ?  ?  ? ? ?Wheelchair Mobility ?  ? ?Modified Rankin (Stroke Patients Only) ?  ? ? ?  ?Balance Overall balance assessment: Needs assistance ?Sitting-balance support: Feet supported ?Sitting balance-Leahy Scale: Fair ?  ?  ?Standing balance support: During functional activity, Reliant on assistive device for balance ?Standing balance-Leahy Scale: Fair ?  ?  ?  ?  ?  ?  ?  ?  ?  ?  ?  ?  ?  ? ?  ?Cognition Arousal/Alertness: Awake/alert ?Behavior During Therapy: [facility] for tasks assessed/performed ?Overall Cognitive Status: Within Functional Limits for tasks assessed ?  ?  ?  ?  ?  ?  ?  ?  ?  ?  ?  ?  ?  ?  ?  ?  ?  ?  ?  ? ?  ?Exercises Other Exercises ?Other Exercises: standing ex and static standing at chairside ? ?  ?General Comments   ?  ?  ? ?Pertinent Vitals/Pain Pain Assessment ?Pain Assessment: Faces ?Faces Pain Scale: Hurts a little bit ?Pain Location: BLE ankles & once standing pt radiates up to posterior thigh ?Pain Descriptors / Indicators: Discomfort, Crying, Grimacing  ? ? ?Home Living   ?  ?  ?  ?  ?  ?  ?  ?  ?  ?   ?  ?Prior Function    ?  ?  ?   ? ?  PT Goals (current goals can now be found in the care plan section) Progress towards PT goals: Progressing toward goals ? ?  ?Frequency ? ? ? Min 2X/week ? ? ? ?  ?PT Plan Current plan remains appropriate  ? ? ?Co-evaluation   ?  ?  ?  ?  ? ?  ?AM-PAC PT "6 Clicks" Mobility   ?Outcome Measure ? Help needed turning from your back to your side while in a flat bed without using bedrails?: None ?Help needed moving from lying on your back to sitting on the side of a flat bed without using bedrails?: None ?Help needed moving to and from a bed to a chair (including a wheelchair)?: A Little ?Help needed standing up from a chair  using your arms (e.g., wheelchair or bedside chair)?: A Little ?Help needed to walk in hospital room?: A Lot ?Help needed climbing 3-5 steps with a railing? : Total ?6 Click Score: 17 ? ?  ?End of Session Equipment Utilized During Treatment: Gait belt ?Activity Tolerance: Patient tolerated treatment well;Patient limited by fatigue ?Patient left: with chair alarm set;with call bell/phone within reach;in chair ?Nurse Communication: Mobility status ?PT Visit Diagnosis: Difficulty in walking, not elsewhere classified (R26.2);Muscle weakness (generalized) (M62.81);Pain ?  ? ? ?Time: 4944-9675 ?PT Time Calculation (min) (ACUTE ONLY): 13 min ? ?Charges:  $Therapeutic Activity: 8-22 mins          ?         Chesley Noon, PTA ?12/12/21, 11:55 AM ? ?

## 2021-12-12 NOTE — Progress Notes (Signed)
Mobility Specialist - Progress Note ? ? 12/12/21 1600  ?Mobility  ?Activity Stood at bedside;Dangled on edge of bed  ?Level of Assistance Minimal assist, patient does 75% or more  ?Assistive Device Front wheel walker  ?Distance Ambulated (ft) 0 ft  ?Activity Response Tolerated well  ?$Mobility charge 1 Mobility  ? ? ? ?Pt able to complete bed mobility modI. Donned UB clothing independently; LB clothing with minA. BLE pain once upright. STS x3 minA. Pt returned to bed with alarm set, needs and supper within reach.  ? ? ?Kathee Delton ?Mobility Specialist ?12/12/21, 4:50 PM ? ? ? ? ?

## 2021-12-12 NOTE — TOC Transition Note (Signed)
Transition of Care (TOC) - CM/SW Discharge Note ? ? ?Patient Details  ?Name: Marilyn Romero ?MRN: [id number] ?Date of Birth: 09-21-1933 ? ?Transition of Care (TOC) CM/SW Contact:  ?Candie Chroman, LCSW ?Phone Number: ?12/12/2021, 4:14 PM ? ? ?Clinical Narrative:  Patient has orders to discharge to [facility] today. RN will call report to [phone] (Room 602B). EMS transport has been arranged and she is 3rd on the list. No further concerns. CSW signing off. ? ?Final next level of care: [place] ?Barriers to Discharge: Barriers Resolved ? ? ?Patient Goals and CMS Choice ?  ?CMS Medicare.gov Compare Post Acute Care list provided to:: Patient ?Choice offered to / list presented to : Patient, Adult Children ? ?Discharge Placement ?PASRR number recieved: 12/11/21 ?           ?Patient chooses bed at: [facility] [place] ?Patient to be transferred to facility by: EMS ?Name of family member notified: Miosotis Wetsel ?Patient and family notified of of transfer: 12/12/21 ? ?Discharge Plan and Services ?  ?  ?Post Acute Care Choice: [place]          ?  ?  ?  ?  ?  ?  ?  ?  ?  ?  ? ?Social Determinants of Health (SDOH) Interventions ?  ? ? ?Readmission Risk Interventions ?   ? View : No data to display.  ?  ?  ?  ? ? ? ? ? ?

## 2021-12-12 NOTE — TOC Progression Note (Signed)
Transition of Care (TOC) - Progression Note  ? ? ?Patient Details  ?Name: Marilyn Romero ?MRN: [id number] ?Date of Birth: 23-Sep-1933 ? ?Transition of Care (TOC) CM/SW Contact  ?Candie Chroman, LCSW ?Phone Number: ?12/12/2021, 12:54 PM ? ?Clinical Narrative:   Patient and daughter have accepted bed offer from [facility]. Left message for admissions coordinator to notify. Will start auth. ? ?Expected Discharge Plan: [place] ?Barriers to Discharge: Continued Medical Work up ? ?Expected Discharge Plan and Services ?Expected Discharge Plan: [place] ?  ?  ?Post Acute Care Choice: [place] ?Living arrangements for the past 2 months: Apartment ?                ?  ?  ?  ?  ?  ?  ?  ?  ?  ?  ? ? ?Social Determinants of Health (SDOH) Interventions ?  ? ?Readmission Risk Interventions ?   ? View : No data to display.  ?  ?  ?  ? ? ?

## 2021-12-12 NOTE — Discharge Summary (Signed)
?Physician Discharge Summary ?  ?Patient: Marilyn Romero MRN: [id number] DOB: 09-07-1933  ?Admit date:     12/08/2021  ?Discharge date: 12/12/21  ?Discharge Physician: Emeterio Reeve  ? ?PCP: Gayland Curry, MD  ? ?Recommendations at discharge:  ?Follow-up with PCP or attending provider/physician at SNF within 1 week ?Outpatient orthopedic follow-up to address chronic musculoskeletal pain particularly new diagnosis Baker's cyst of left knee ? ?Discharge Diagnoses: ?Principal Problem: ?  SBO (small bowel obstruction) ([place]) ?Active Problems: ?  UTI (urinary tract infection) ?  GERD (gastroesophageal reflux disease) ?  Essential hypertension ?  Musculoskeletal pain ?  Dysphagia ?  Baker's cyst of knee, left ? ?Brief Narrative / Hospital Course:  ?Presented to ED 12/08/2021 with chief complaint of abdominal pain starting same day.  Abdominal surgery history includes hemicolectomy for colon cancer, appendectomy, cholecystectomy.  CT showed SBO, transition point deep L UQ, admitted to hospitalist with general surgery consult.  NG placed.  NPO.  IV fluids.  Antiemetics and pain control.  UTI treatment with Rocephin, UCX pending.  IV PPI for history of GERD.  IV hydralazine for hypertension.   ?12/09/2021, remains n.p.o., NG in place.  General surgery planning for Gastrografin challenge and continue conservative management.  Gastrografin challenge revealed progression of enteric contrast to the level of the rectum, diet was advanced.   ?12/10/21 patient tolerating soft diet, abdominal pain is reduced, she reports BM/flatus.  At this point, SBO seems to have resolved.  Patient's main complaint is left lower extremity pain, describes as burning/swelling feeling.  There is some mild edema but no pitting, x-rays of left tib/fib, ankle, foot are negative.  DVT study negative for DVT but there is large Baker's cyst.  ?12/11/21 continuing to tolerate po but c/o MSK pain. PT evaluated and recommending SNF. awaiting  placement ?12/12/21 placement confirmed, stable for discharge  ?  ? ?Consultants:  ?General surgery ?  ?Procedures: ?None ?  ? ? ?Assessment and Plan: ?* SBO (small bowel obstruction) ([place]) ?SBO w/ transition point in the deep left upper quadrant (proximal) d/t adhesions vs enteritis ?Patient has had prior hemicolectomy for colon cancer, also s/p hysterectomy, appendectomy, cholecystectomy ?She has a very proximal SBO therefore abdomen is not significantly distended. ?IV fluid hydration, antiemetics and IV pain medicationPO ?NG placed for gastric decompression  ?General Surgery following: Gastrografin challenge showes contract to rectum, SBO has resolved, pt tolerating liquid diet as of 12/10/2021 and tolerating advancement of diet into 12/11/2021 ? ?UTI (urinary tract infection) ?Present on admission. Patient noted to have significant pyuria ?Rocephin 1 g IV daily ?urine culture: Mixed bacteria suggestive of contamination ? ?GERD (gastroesophageal reflux disease) ?Stable ?IV PPI ? ?Essential hypertension ?Blood pressure is uncontrolled ?as needed IV hydralazine ?Tolerating po, adding Losartan today 12/10/21  ? ?Baker's cyst of knee, left ?Follow w/ ortho outpatient  ? ?Dysphagia ?Patient complains of dysphagia for both solids and liquids but denies having any significant weight loss ?H&P mentions GI evaluation, no consult order in system, will assess ? ?Musculoskeletal pain ?Patient reports left lower extremity pain 05/14, distal to knee, described as aching/throbbing/burning and feeling of swelling.  ?No significant exam findings, x-ray tip/fib, ankle, foot on left are normal.   ?No DVT but (+)baker's cyst on [place] ?Pt using prn pai meds ?Robaxin order placed ?If neg DVT may be developing shingles given pain description, no rash at this point, monitor skin but bakers cyst could also explain symptoms, recheck in AM ?5/15 complaining of bilateral leg pain, foot pain, hip  pain.   ?PT has evaluated and recommended SNF,  placement pending ? ? ? ? ? ? ?Disposition: SNF ?Diet recommendation:  ?Cardiac diet ?DISCHARGE MEDICATION: ?Allergies as of 12/12/2021   ?No Known Allergies ?  ? ?  ?Medication List  ?  ? ?TAKE these medications   ? ?atorvastatin 20 MG tablet ?Commonly known as: LIPITOR ?Take 20 mg by mouth daily. ?  ?ibuprofen 200 MG tablet ?Commonly known as: ADVIL ?Take 200 mg by mouth every 6 (six) hours as needed. Reported on 10/31/2015 ?  ?traMADol 50 MG tablet ?Commonly known as: ULTRAM ?Take 1 tablet (50 mg total) by mouth every 12 (twelve) hours as needed. ?Start taking on: Dec 14, 2021 ?  ? ?  ? ? Contact information for after-discharge care   ? ? Destination   ? ? [facility] Preferred [facility]F .   ?Service: Skilled Nursing ?Contact information: ?[address] ?[place] ?[phone] ? ?  ?  ? ?  ?  ? ?  ?  ? ?  ? ?Discharge Exam: ?Danley Danker Weights  ? 12/08/21 0530  ?Weight: 68.3 kg  ? ?Examination: ?Constitutional:  ?VS as above ?General Appearance: alert, well-developed, NAD ?Respiratory: ?Normal respiratory effort ?Breath sounds normal, no wheeze/rhonchi/rales ?Cardiovascular: ?S1/S2 normal, no murmur/rub/gallop auscultated ?No lower extremity edema ?Gastrointestinal: ?Nontender, no masses ?BS WNLx4 ?Musculoskeletal:  ?No clubbing/cyanosis of digits ?No edema LE ?Normal passive ROM knees, ankles, hips ?Neurological: ?No cranial nerve deficit on limited exam ?Psychiatric: ?Normal judgment/insight ?Depressed mood and affect ? ?Condition at discharge: good ? ?The results of significant diagnostics from this hospitalization (including imaging, microbiology, ancillary and laboratory) are listed below for reference.  ? ?Imaging Studies: ?DG Tibia/Fibula Left ? ?Result Date: 12/10/2021 ?CLINICAL DATA:  Provided indication is possible DVT. EXAM: LEFT TIBIA AND FIBULA - 2 VIEW COMPARISON:  None Available. FINDINGS: No fracture or bone lesion. Knee and ankle joints are normally aligned.  Subcutaneous soft tissue edema noted along the lower leg and ankle. IMPRESSION: No fracture, bone lesion or significant joint abnormality. Electronically Signed   By: Lajean Manes M.D.   On: 12/10/2021 11:04  ? ?DG Ankle 2 Views Left ? ?Result Date: 12/10/2021 ?CLINICAL DATA:  Provided indication is possible DVT. EXAM: LEFT ANKLE - 2 VIEW COMPARISON:  None Available. FINDINGS: No fracture or bone lesion. Ankle joint normally aligned. Skeletal structures are demineralized. Nonspecific soft tissue swelling. Small dorsal and plantar calcaneal spurs. IMPRESSION: 1. No fracture, dislocation or bone lesion. Electronically Signed   By: Lajean Manes M.D.   On: 12/10/2021 11:06  ? ?CT ABDOMEN PELVIS W CONTRAST ? ?Result Date: 12/08/2021 ?CLINICAL DATA:  Acute, diffuse abdominal pain. Urinary frequency for 1 month. Pain radiates to the left arm EXAM: CT ABDOMEN AND PELVIS WITH CONTRAST TECHNIQUE: Multidetector CT imaging of the abdomen and pelvis was performed using the standard protocol following bolus administration of intravenous contrast. RADIATION DOSE REDUCTION: This exam was performed according to the departmental dose-optimization program which includes automated exposure control, adjustment of the mA and/or kV according to patient size and/or use of iterative reconstruction technique. CONTRAST:  145m OMNIPAQUE IOHEXOL 300 MG/ML  SOLN COMPARISON:  03/01/2017 FINDINGS: Lower chest: No acute finding. Coronary atherosclerosis and mitral annular calcification. Mild subpleural scarring or atelectasis at the bases. Hepatobiliary: Cholecystectomy with chronic biliary dilatation, although progressed in the liver.No focal liver finding. Pancreas: Unremarkable for age Spleen: Unremarkable. Adrenals/Urinary Tract: Negative adrenals. No hydronephrosis or stone. Right renal cystic densities. Unremarkable bladder. Stomach/Bowel:  Dilated proximal small bowel and stomach with multiple fecalized segments of dilated small bowel.  Transition point appears to be in the deep left upper quadrant where there is an angulated loop of bowel transitioning to decompressed distal small bowel. An internal hernia is considered given this appearance of the transition poin

## 2022-01-09 ENCOUNTER — Ambulatory Visit: Payer: Medicare Other

## 2022-03-06 ENCOUNTER — Encounter: Payer: Self-pay | Admitting: Student in an Organized Health Care Education/Training Program

## 2022-03-06 ENCOUNTER — Ambulatory Visit
Payer: Medicare Other | Attending: Student in an Organized Health Care Education/Training Program | Admitting: Student in an Organized Health Care Education/Training Program

## 2022-03-06 VITALS — BP 155/79 | HR 80 | Temp 97.1°F | Ht 65.0 in | Wt 150.0 lb

## 2022-03-06 DIAGNOSIS — M533 Sacrococcygeal disorders, not elsewhere classified: Secondary | ICD-10-CM | POA: Insufficient documentation

## 2022-03-06 DIAGNOSIS — G894 Chronic pain syndrome: Secondary | ICD-10-CM | POA: Diagnosis present

## 2022-03-06 DIAGNOSIS — T8484XS Pain due to internal orthopedic prosthetic devices, implants and grafts, sequela: Secondary | ICD-10-CM | POA: Insufficient documentation

## 2022-03-06 DIAGNOSIS — M47816 Spondylosis without myelopathy or radiculopathy, lumbar region: Secondary | ICD-10-CM | POA: Insufficient documentation

## 2022-03-06 DIAGNOSIS — M5416 Radiculopathy, lumbar region: Secondary | ICD-10-CM | POA: Insufficient documentation

## 2022-03-06 DIAGNOSIS — Z96649 Presence of unspecified artificial hip joint: Secondary | ICD-10-CM | POA: Insufficient documentation

## 2022-03-06 DIAGNOSIS — Z0289 Encounter for other administrative examinations: Secondary | ICD-10-CM | POA: Insufficient documentation

## 2022-03-06 MED ORDER — TRAMADOL HCL 50 MG PO TABS: 50.0000 mg | ORAL_TABLET | Freq: Two times a day (BID) | ORAL | 2 refills | Status: AC | PRN

## 2022-03-06 NOTE — Progress Notes (Signed)
PROVIDER NOTE: Information contained herein reflects review and annotations entered in association with encounter. Interpretation of such information and data should be left to medically-trained personnel. Information provided to patient can be located elsewhere in the medical record under "Patient Instructions". Document created using STT-dictation technology, any transcriptional errors that may result from process are unintentional.    Patient: Marilyn Romero  Service Category: E/M  Provider: Gillis Santa, MD  DOB: 05-03-1934  DOS: 03/06/2022  Specialty: Interventional Pain Management  MRN: [id number]  Setting: Ambulatory outpatient  PCP: Gayland Curry, MD  Type: Established Patient    Referring Provider: Gayland Curry, MD  Location: Office  Delivery: Face-to-face     HPI  [name], a 88 y.o. year old female, is here today because of her Pain due to total hip replacement, sequela [T84.84XS, Z96.649]. [name] primary complain today is Back Pain (Right side)  Last encounter: My last encounter with her was on 11/30/21  Pertinent problems: [name] has DDD (degenerative disc disease), lumbar; Facet syndrome, lumbar; Sacroiliac joint dysfunction of both sides; Musculoskeletal pain; Lumbar radiculopathy; Status post total replacement of right hip; Primary osteoarthritis of one hip; Pain due to total hip replacement ([place]); and Chronic pain syndrome on their pertinent problem list. Pain Assessment: Severity of Chronic pain is reported as a 7 /10. Location: Back Right/Denies. Onset: More than a month ago. Quality: Aching, Constant, Discomfort, Throbbing, Nagging. Timing: Constant. Modifying factor(s): Meds, ice. Vitals:  height is _0  (1.651 m) and weight is 150 lb (68 kg). Her temperature is 97.1 F (36.2 C) (abnormal). Her blood pressure is 155/79 (abnormal) and her pulse is 80. Her oxygen saturation is 99%.   Reason for encounter: medication management.    No change in medical  history since last visit.  Patient's pain is at baseline. Here for Tramadol refill, takes 50 mg BID, and very seldomly when her pain is very severe she will take 100 mg.  No recent falls.  History of right hip replacement.  Persistent and severe right hip pain. Renew UDS today.   03/28/21 No change in patient's medical history.  UDS appropriate.  We will have patient sign pain contract and take over her chronic pain regimen.  01/10/21 [name] is a pleasant 88 year old female who presents with a chief complaint of right hip pain.  She is status post right hip replacement surgery with subsequent revision.  Her initial surgery was in [place] and her revision was done a couple of years later at [facility].  She has tried steroid injections in her right hip.  She endorses a knot in her right hip and groin region that is painful when she is going from supine to sitting and standing.  She is on tramadol 50 mg twice daily which has been managed by orthopedics.  She is being referred here for chronic pain management, primarily management of her tramadol.  She is accompanied today by her daughter.  She has done physical therapy in the past.  Pharmacotherapy Assessment  Analgesic: Tramadol 50 mg BID prn   Monitoring: [place] PMP: PDMP reviewed during this encounter.       Pharmacotherapy: No side-effects or adverse reactions reported. Compliance: No problems identified. Effectiveness: Clinically acceptable.  Chauncey Fischer, RN  03/06/2022  2:15 PM  Sign when Signing Visit Nursing Pain Medication Assessment:  Safety precautions to be maintained throughout the outpatient stay will include: orient to surroundings, keep bed in low position, maintain call bell within reach at  all times, provide assistance with transfer out of bed and ambulation.  Nursing Pain Medication Assessment:  Safety precautions to be maintained throughout the outpatient stay will include: orient to surroundings, keep bed in low position, maintain  call bell within reach at all times, provide assistance with transfer out of bed and ambulation.  Medication Inspection Compliance: [name] did not comply with our request to bring her pills to be counted. She was reminded that bringing the medication bottles, even when empty, is a requirement.  Medication: None brought in. Pill/Patch Count: None available to be counted. Bottle Appearance: No container available. Did not bring bottle(s) to appointment. Filled Date: N/A Last Medication intake:  Ran out of medicine more than 48 hours ago   UDS:  Summary  Date Value Ref Range Status  01/10/2021 Note  Final    Comment:    ==================================================================== Compliance Drug Analysis, Ur ==================================================================== Test                             Result       Flag       Units  Drug Present and Declared for Prescription Verification   Tramadol                       >9804        EXPECTED   ng/mg creat   O-Desmethyltramadol            5612         EXPECTED   ng/mg creat   N-Desmethyltramadol            >9804        EXPECTED   ng/mg creat    Source of tramadol is a prescription medication. O-desmethyltramadol    and N-desmethyltramadol are expected metabolites of tramadol.    Ibuprofen                      PRESENT      EXPECTED  Drug Present not Declared for Prescription Verification   Diphenhydramine                PRESENT      UNEXPECTED ==================================================================== Test                      Result    Flag   Units      Ref Range   Creatinine              51               mg/dL      >=20 ==================================================================== Declared Medications:  The flagging and interpretation on this report are based on the  following declared medications.  Unexpected results may arise from  inaccuracies in the declared medications.   **Note: The testing  scope of this panel includes these medications:   Tramadol (Ultram)   **Note: The testing scope of this panel does not include small to  moderate amounts of these reported medications:   Ibuprofen (Advil)   **Note: The testing scope of this panel does not include the  following reported medications:   Atorvastatin (Lipitor) ==================================================================== For clinical consultation, please call [phone]. ====================================================================      ROS  Constitutional: Denies any fever or chills Gastrointestinal: No reported hemesis, hematochezia, vomiting, or acute GI distress Musculoskeletal:  Right hip pain Neurological: No reported episodes of acute onset  apraxia, aphasia, dysarthria, agnosia, amnesia, paralysis, loss of coordination, or loss of consciousness  Medication Review  atorvastatin, ibuprofen, and traMADol  History Review  Allergy: Ms. Abboud has No Known Allergies. Drug: Ms. Riedlinger  reports no history of drug use. Alcohol:  reports no history of alcohol use. Tobacco:  reports that she has never smoked. She has never used smokeless tobacco. Social: Ms. Hornsby  reports that she has never smoked. She has never used smokeless tobacco. She reports that she does not drink alcohol and does not use drugs. Medical:  has a past medical history of Cancer of sigmoid colon ([place]) (12/08/2014), Colonic mass (10/07/2012), DDD (degenerative disc disease), lumbar (12/16/2014), Diverticulitis (06/27/2011), Facet syndrome, lumbar (12/16/2014), GERD (gastroesophageal reflux disease) (05/26/2012), Insomnia (06/18/2016), Lesion of bladder (04/29/2012), Lumbar radiculopathy (10/04/2015), Sacroiliac joint dysfunction of both sides (12/16/2014), and Status post total replacement of right hip (08/15/2015). Surgical: Ms. Derrick  has a past surgical history that includes Colon resection; Hip surgery (Right); Lung surgery; Colonoscopy with  propofol (N/A, 07/02/2016); and Esophagogastroduodenoscopy (egd) with propofol (N/A, 07/02/2016). Family: family history includes Arthritis in her father; Breast cancer in an other family member; Early death in her mother; Hypertension in her father.  Laboratory Chemistry Profile   Renal Lab Results  Component Value Date   BUN 11 12/09/2021   CREATININE 0.64 12/09/2021   GFRAA >60 05/16/2019   GFRNONAA >60 12/09/2021    Hepatic Lab Results  Component Value Date   AST 23 12/08/2021   ALT 19 12/08/2021   ALBUMIN 4.5 12/08/2021   ALKPHOS 62 12/08/2021   LIPASE 114 (H) 12/08/2021    Electrolytes Lab Results  Component Value Date   NA 137 12/09/2021   K 3.6 12/09/2021   CL 111 12/09/2021   CALCIUM 8.3 (L) 12/09/2021    Bone No results found for: "VD25OH", "VD125OH2TOT", "WJ1914NW2", "NF6213YQ6", "25OHVITD1", "25OHVITD2", "25OHVITD3", "TESTOFREE", "TESTOSTERONE"  Inflammation (CRP: Acute Phase) (ESR: Chronic Phase) No results found for: "CRP", "ESRSEDRATE", "LATICACIDVEN"       Note: Above Lab results reviewed.  Recent Imaging Review  US Venous Img Lower Unilateral Left (DVT) CLINICAL DATA:  Left lower extremity pain  EXAM: LEFT LOWER EXTREMITY VENOUS DOPPLER ULTRASOUND  TECHNIQUE: Gray-scale sonography with compression, as well as color and duplex ultrasound, were performed to evaluate the deep venous system(s) from the level of the common femoral vein through the popliteal and proximal calf veins.  COMPARISON:  None Available.  FINDINGS: VENOUS  Normal compressibility of the common femoral, superficial femoral, and popliteal veins, as well as the visualized calf veins. Visualized portions of profunda femoral vein and great saphenous vein unremarkable. No filling defects to suggest DVT on grayscale or color Doppler imaging. Doppler waveforms show normal direction of venous flow, normal respiratory plasticity and response to augmentation.  Limited views of the  contralateral common femoral vein are unremarkable.  OTHER  Fluid collection in the medial popliteal fossa measures 6.2 x 2.5 x 3.8 cm. The collection is anechoic with a mildly thickened wall. No significant internal complexity. Location and appearance are most consistent with that of a Baker's cyst.  Limitations: none  IMPRESSION: 1. No evidence of deep venous thrombosis. 2. Moderately large Baker's cyst.  Electronically Signed   By: Jacqulynn Cadet M.D.   On: 12/10/2021 16:13 DG Ankle 2 Views Left CLINICAL DATA:  Provided indication is possible DVT.  EXAM: LEFT ANKLE - 2 VIEW  COMPARISON:  None Available.  FINDINGS: No fracture or bone lesion.  Ankle joint normally  aligned. Skeletal structures are demineralized.  Nonspecific soft tissue swelling.  Small dorsal and plantar calcaneal spurs.  IMPRESSION: 1. No fracture, dislocation or bone lesion.  Electronically Signed   By: Lajean Manes M.D.   On: 12/10/2021 11:06 DG Foot 2 Views Left CLINICAL DATA:  Provided indication is possible DVT.  EXAM: LEFT FOOT - 2 VIEW  COMPARISON:  None Available.  FINDINGS: No fracture or bone lesion.  Joints normally spaced and aligned. Skeletal structures are diffusely demineralized.  Soft tissue edema of the lower leg and ankle, nonspecific.  IMPRESSION: No fracture or dislocation.  No bone lesion.  Electronically Signed   By: Lajean Manes M.D.   On: 12/10/2021 11:05 DG Tibia/Fibula Left CLINICAL DATA:  Provided indication is possible DVT.  EXAM: LEFT TIBIA AND FIBULA - 2 VIEW  COMPARISON:  None Available.  FINDINGS: No fracture or bone lesion.  Knee and ankle joints are normally aligned.  Subcutaneous soft tissue edema noted along the lower leg and ankle.  IMPRESSION: No fracture, bone lesion or significant joint abnormality.  Electronically Signed   By: Lajean Manes M.D.   On: 12/10/2021 11:04  Note: Reviewed        Physical Exam  General  appearance: Well nourished, well developed, and well hydrated. In no apparent acute distress Mental status: Alert, oriented x 3 (person, place, & time)       Respiratory: No evidence of acute respiratory distress Eyes: PERLA Vitals: BP (!) 155/79   Pulse 80   Temp (!) 97.1 F (36.2 C)   Ht _0  (1.651 m)   Wt 150 lb (68 kg)   SpO2 99%   BMI 24.96 kg/m  BMI: Estimated body mass index is 24.96 kg/m as calculated from the following:   Height as of this encounter: _1  (1.651 m).   Weight as of this encounter: 150 lb (68 kg). Ideal: Ideal body weight: 57 kg (125 lb 10.6 oz) Adjusted ideal body weight: 61.4 kg (135 lb 6.4 oz)      Lumbar Spine Area Exam  Skin & Axial Inspection: No masses, redness, or swelling Alignment: Symmetrical Functional ROM: Pain restricted ROM       Stability: No instability detected Muscle Tone/Strength: Functionally intact. No obvious neuro-muscular anomalies detected. Sensory (Neurological): Musculoskeletal pain pattern   Ambulation: Limited Gait: Antalgic Posture: WNL  Lower Extremity Exam      Side: Right lower extremity   Side: Left lower extremity  Stability: No instability observed           Stability: No instability observed          Skin & Extremity Inspection: Evidence of prior arthroplastic surgery   Skin & Extremity Inspection: Skin color, temperature, and hair growth are WNL. No peripheral edema or cyanosis. No masses, redness, swelling, asymmetry, or associated skin lesions. No contractures.  Functional ROM: Pain restricted ROM hip                    Functional ROM: Unrestricted ROM                  Muscle Tone/Strength: Functionally intact. No obvious neuro-muscular anomalies detected.   Muscle Tone/Strength: Functionally intact. No obvious neuro-muscular anomalies detected.  Sensory (Neurological): Musculoskeletal pain pattern         Sensory (Neurological): Unimpaired        DTR: Patellar: deferred today Achilles: deferred  today Plantar: deferred today   DTR: Patellar: deferred today Achilles: deferred today  Plantar: deferred today  Palpation: No palpable anomalies   Palpation: No palpable anomalies       Assessment   Status Diagnosis  Controlled Controlled Controlled 1. 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 Sacroiliac joint dysfunction of both sides   3. Facet syndrome, lumbar   4. Lumbar radiculopathy   5. Pain management contract signed   6. Chronic pain syndrome          Plan of Care   Requested Prescriptions   Signed Prescriptions Disp Refills   traMADol (ULTRAM) 50 MG tablet 60 tablet 2    Sig: Take 1 tablet (50 mg total) by mouth every 12 (twelve) hours as needed.   Orders Placed This Encounter  Procedures   ToxASSURE Select 13 (MW), Urine    Volume: 30 ml(s). Minimum 3 ml of urine is needed. Document temperature of fresh sample. Indications: Long term (current) use of opiate analgesic [phone])    Order Specific Question:   Release to patient    Answer:   Immediate     Follow-up plan:   Return in about 15 weeks (around 06/19/2022) for Medication Management, in person.    Recent Visits No visits were found meeting these conditions. Showing recent visits within past 90 days and meeting all other requirements Today's Visits Date Type Provider Dept  03/06/22 Office Visit Gillis Santa, MD [facility]  Showing today's visits and meeting all other requirements Future Appointments No visits were found meeting these conditions. Showing future appointments within next 90 days and meeting all other requirements  I discussed the assessment and treatment plan with the patient. The patient was provided an opportunity to ask questions and all were answered. The patient agreed with the plan and demonstrated an understanding of the instructions.  Patient advised to call back or seek an in-person evaluation if the symptoms or condition worsens.  Duration of  encounter: 7mnutes.  Note by: BGillis Santa MD Date: 03/06/2022; Time: 2:19 PM

## 2022-03-06 NOTE — Progress Notes (Signed)
Nursing Pain Medication Assessment:  Safety precautions to be maintained throughout the outpatient stay will include: orient to surroundings, keep bed in low position, maintain call bell within reach at all times, provide assistance with transfer out of bed and ambulation.  Nursing Pain Medication Assessment:  Safety precautions to be maintained throughout the outpatient stay will include: orient to surroundings, keep bed in low position, maintain call bell within reach at all times, provide assistance with transfer out of bed and ambulation.  Medication Inspection Compliance: Ms. Polkowski did not comply with our request to bring her pills to be counted. She was reminded that bringing the medication bottles, even when empty, is a requirement.  Medication: None brought in. Pill/Patch Count: None available to be counted. Bottle Appearance: No container available. Did not bring bottle(s) to appointment. Filled Date: N/A Last Medication intake:  Ran out of medicine more than 48 hours ago

## 2022-03-10 LAB — TOXASSURE SELECT 13 (MW), URINE

## 2022-06-06 ENCOUNTER — Ambulatory Visit: Payer: Medicare Other | Admitting: Urology

## 2022-06-12 ENCOUNTER — Encounter: Payer: Self-pay | Admitting: Student in an Organized Health Care Education/Training Program

## 2022-06-12 ENCOUNTER — Ambulatory Visit
Payer: Medicare Other | Attending: Student in an Organized Health Care Education/Training Program | Admitting: Student in an Organized Health Care Education/Training Program

## 2022-06-12 VITALS — BP 161/91 | HR 76 | Temp 97.4°F | Ht 65.0 in | Wt 150.0 lb

## 2022-06-12 DIAGNOSIS — M7061 Trochanteric bursitis, right hip: Secondary | ICD-10-CM | POA: Insufficient documentation

## 2022-06-12 DIAGNOSIS — T8484XS Pain due to internal orthopedic prosthetic devices, implants and grafts, sequela: Secondary | ICD-10-CM | POA: Insufficient documentation

## 2022-06-12 DIAGNOSIS — Z96649 Presence of unspecified artificial hip joint: Secondary | ICD-10-CM | POA: Diagnosis present

## 2022-06-12 DIAGNOSIS — G894 Chronic pain syndrome: Secondary | ICD-10-CM | POA: Insufficient documentation

## 2022-06-12 DIAGNOSIS — M533 Sacrococcygeal disorders, not elsewhere classified: Secondary | ICD-10-CM | POA: Insufficient documentation

## 2022-06-12 DIAGNOSIS — M47816 Spondylosis without myelopathy or radiculopathy, lumbar region: Secondary | ICD-10-CM | POA: Insufficient documentation

## 2022-06-12 DIAGNOSIS — M5416 Radiculopathy, lumbar region: Secondary | ICD-10-CM | POA: Insufficient documentation

## 2022-06-12 MED ORDER — TRAMADOL HCL 50 MG PO TABS: 50.0000 mg | ORAL_TABLET | Freq: Three times a day (TID) | ORAL | 2 refills | Status: AC | PRN

## 2022-06-12 NOTE — Progress Notes (Signed)
Nursing Pain Medication Assessment:  Safety precautions to be maintained throughout the outpatient stay will include: orient to surroundings, keep bed in low position, maintain call bell within reach at all times, provide assistance with transfer out of bed and ambulation.  Medication Inspection Compliance: Marilyn Romero did not comply with our request to bring her pills to be counted. She was reminded that bringing the medication bottles, even when empty, is a requirement.  Medication: None brought in. Pill/Patch Count: None available to be counted. Bottle Appearance: No container available. Did not bring bottle(s) to appointment. Filled Date: N/A Last Medication intake:  YesterdaySafety precautions to be maintained throughout the outpatient stay will include: orient to surroundings, keep bed in low position, maintain call bell within reach at all times, provide assistance with transfer out of bed and ambulation.

## 2022-06-12 NOTE — Progress Notes (Signed)
PROVIDER NOTE: Information contained herein reflects review and annotations entered in association with encounter. Interpretation of such information and data should be left to medically-trained personnel. Information provided to patient can be located elsewhere in the medical record under "Patient Instructions". Document created using STT-dictation technology, any transcriptional errors that may result from process are unintentional.    Patient: Marilyn Romero  Service Category: E/M  Provider: Gillis Santa, MD  DOB: 1934-01-26  DOS: 06/12/2022  Specialty: Interventional Pain Management  MRN: [id number]  Setting: Ambulatory outpatient  PCP: Gayland Curry, MD  Type: Established Patient    Referring Provider: Gayland Curry, MD  Location: Office  Delivery: Face-to-face     HPI  [name], a 88 y.o. year old female, is here today because of her Pain due to total hip replacement, sequela [T84.84XS, Z96.649]. Marilyn Romero primary complain today is Back Pain (Right side)  Last encounter: My last encounter with her was on 03/06/22  Pertinent problems: Marilyn Romero has DDD (degenerative disc disease), lumbar; Facet syndrome, lumbar; Sacroiliac joint dysfunction of both sides; Musculoskeletal pain; Lumbar radiculopathy; Status post total replacement of right hip; Primary osteoarthritis of one hip; Pain due to total hip replacement ([place]); and Chronic pain syndrome on their pertinent problem list. Pain Assessment: Severity of Chronic pain is reported as a 8 /10. Location: Back Right/pain radiaities down her right leg. Onset: More than a month ago. Quality: Constant, Aching, Sharp. Timing: Constant. Modifying factor(s): Meds ice. Vitals:  height is 5' 5" (1.651 m) and weight is 150 lb (68 kg). Her temperature is 97.4 F (36.3 C) (abnormal). Her blood pressure is 161/91 (abnormal) and her pulse is 76. Her oxygen saturation is 100%.   Reason for encounter: medication management.    No change in medical  history since last visit.  Patient's pain has increased especially overlying her right greater trochanter.  There is tenderness to palpation overlying her right trochanteric bursa.  We discussed a right hip greater trochanteric bursa injection under fluoroscopy.  We also discussed modifying her tramadol so that she is taking 50 mg in the morning as needed and 50 to 100 mg in the evening prior to bed depending upon her pain.  Patient's daughter was present with her for the duration of the encounter.  She states that her mother underwent a to a place that is more senior friendly and that her increased right hip pain may be related to the move as well.   03/28/21 No change in patient's medical history.  UDS appropriate.  We will have patient sign pain contract and take over her chronic pain regimen.  01/10/21 Aaleyah Witherow is a pleasant 88 year old female who presents with a chief complaint of right hip pain.  She is status post right hip replacement surgery with subsequent revision.  Her initial surgery was in [place] and her revision was done a couple of years later at [facility].  She has tried steroid injections in her right hip.  She endorses a knot in her right hip and groin region that is painful when she is going from supine to sitting and standing.  She is on tramadol 50 mg twice daily which has been managed by orthopedics.  She is being referred here for chronic pain management, primarily management of her tramadol.  She is accompanied today by her daughter.  She has done physical therapy in the past.  Pharmacotherapy Assessment  Analgesic: Tramadol 50 mg TID prn   Monitoring: [place] PMP: PDMP reviewed during  this encounter.       Pharmacotherapy: No side-effects or adverse reactions reported. Compliance: No problems identified. Effectiveness: Clinically acceptable.  Brown, Sheila A, RN  06/12/2022  2:52 PM  Sign when Signing Visit Nursing Pain Medication Assessment:  Safety precautions to be maintained  throughout the outpatient stay will include: orient to surroundings, keep bed in low position, maintain call bell within reach at all times, provide assistance with transfer out of bed and ambulation.  Medication Inspection Compliance: Marilyn Romero did not comply with our request to bring her pills to be counted. She was reminded that bringing the medication bottles, even when empty, is a requirement.  Medication: None brought in. Pill/Patch Count: None available to be counted. Bottle Appearance: No container available. Did not bring bottle(s) to appointment. Filled Date: N/A Last Medication intake:  YesterdaySafety precautions to be maintained throughout the outpatient stay will include: orient to surroundings, keep bed in low position, maintain call bell within reach at all times, provide assistance with transfer out of bed and ambulation.      UDS:  Summary  Date Value Ref Range Status  03/06/2022 Note  Final    Comment:    ==================================================================== [employer] Select 13 (MW) ==================================================================== Test                             Result       Flag       Units  Drug Present and Declared for Prescription Verification   N-Desmethyltramadol            1081         EXPECTED   ng/mg creat    N-desmethyltramadol is an expected metabolite of tramadol. Source of    tramadol is a prescription medication.  Drug Absent but Declared for Prescription Verification   Tramadol                       Not Detected UNEXPECTED ng/mg creat    Tramadol is almost always present in patients taking this drug    consistently.  Absence of tramadol could be due to lapse of time    since the last dose or unusual pharmacokinetics (rapid metabolism).  ==================================================================== Test                      Result    Flag   Units      Ref Range   Creatinine              95               mg/dL       >=20 ==================================================================== Declared Medications:  The flagging and interpretation on this report are based on the  following declared medications.  Unexpected results may arise from  inaccuracies in the declared medications.   **Note: The testing scope of this panel includes these medications:   Tramadol (Ultram)   **Note: The testing scope of this panel does not include the  following reported medications:   Atorvastatin (Lipitor)  Ibuprofen (Advil) ==================================================================== For clinical consultation, please call [phone]. ====================================================================      ROS  Constitutional: Denies any fever or chills Gastrointestinal: No reported hemesis, hematochezia, vomiting, or acute GI distress Musculoskeletal:  Right hip pain Neurological: No reported episodes of acute onset apraxia, aphasia, dysarthria, agnosia, amnesia, paralysis, loss of coordination, or loss of consciousness  Medication Review  atorvastatin, ibuprofen,   and traMADol  History Review  Allergy: Marilyn Romero has No Known Allergies. Drug: Marilyn Romero  reports no history of drug use. Alcohol:  reports no history of alcohol use. Tobacco:  reports that she has never smoked. She has never used smokeless tobacco. Social: Marilyn Romero  reports that she has never smoked. She has never used smokeless tobacco. She reports that she does not drink alcohol and does not use drugs. Medical:  has a past medical history of Cancer of sigmoid colon (HCC) (12/08/2014), Colonic mass (10/07/2012), DDD (degenerative disc disease), lumbar (12/16/2014), Diverticulitis (06/27/2011), Facet syndrome, lumbar (12/16/2014), GERD (gastroesophageal reflux disease) (05/26/2012), Insomnia (06/18/2016), Lesion of bladder (04/29/2012), Lumbar radiculopathy (10/04/2015), Sacroiliac joint dysfunction of both sides (12/16/2014), and  Status post total replacement of right hip (08/15/2015). Surgical: Marilyn Romero  has a past surgical history that includes Colon resection; Hip surgery (Right); Lung surgery; Colonoscopy with propofol (N/A, 07/02/2016); and Esophagogastroduodenoscopy (egd) with propofol (N/A, 07/02/2016). Family: family history includes Arthritis in her father; Breast cancer in an other family member; Early death in her mother; Hypertension in her father.  Laboratory Chemistry Profile   Renal Lab Results  Component Value Date   BUN 11 12/09/2021   CREATININE 0.64 12/09/2021   GFRAA >60 05/16/2019   GFRNONAA >60 12/09/2021    Hepatic Lab Results  Component Value Date   AST 23 12/08/2021   ALT 19 12/08/2021   ALBUMIN 4.5 12/08/2021   ALKPHOS 62 12/08/2021   LIPASE 114 (H) 12/08/2021    Electrolytes Lab Results  Component Value Date   NA 137 12/09/2021   K 3.6 12/09/2021   CL 111 12/09/2021   CALCIUM 8.3 (L) 12/09/2021    Bone No results found for: "VD25OH", "VD125OH2TOT", "VD3125OH2", "VD2125OH2", "25OHVITD1", "25OHVITD2", "25OHVITD3", "TESTOFREE", "TESTOSTERONE"  Inflammation (CRP: Acute Phase) (ESR: Chronic Phase) No results found for: "CRP", "ESRSEDRATE", "LATICACIDVEN"       Note: Above Lab results reviewed.  Recent Imaging Review  US Venous Img Lower Unilateral Left (DVT) CLINICAL DATA:  Left lower extremity pain  EXAM: LEFT LOWER EXTREMITY VENOUS DOPPLER ULTRASOUND  TECHNIQUE: Gray-scale sonography with compression, as well as color and duplex ultrasound, were performed to evaluate the deep venous system(s) from the level of the common femoral vein through the popliteal and proximal calf veins.  COMPARISON:  None Available.  FINDINGS: VENOUS  Normal compressibility of the common femoral, superficial femoral, and popliteal veins, as well as the visualized calf veins. Visualized portions of profunda femoral vein and great saphenous vein unremarkable. No filling defects to suggest  DVT on grayscale or color Doppler imaging. Doppler waveforms show normal direction of venous flow, normal respiratory plasticity and response to augmentation.  Limited views of the contralateral common femoral vein are unremarkable.  OTHER  Fluid collection in the medial popliteal fossa measures 6.2 x 2.5 x 3.8 cm. The collection is anechoic with a mildly thickened wall. No significant internal complexity. Location and appearance are most consistent with that of a Baker's cyst.  Limitations: none  IMPRESSION: 1. No evidence of deep venous thrombosis. 2. Moderately large Baker's cyst.  Electronically Signed   By: Heath  McCullough M.D.   On: 12/10/2021 16:13 DG Ankle 2 Views Left CLINICAL DATA:  Provided indication is possible DVT.  EXAM: LEFT ANKLE - 2 VIEW  COMPARISON:  None Available.  FINDINGS: No fracture or bone lesion.  Ankle joint normally aligned. Skeletal structures are demineralized.  Nonspecific soft tissue swelling.  Small dorsal and plantar calcaneal spurs.  IMPRESSION:   1. No fracture, dislocation or bone lesion.  Electronically Signed   By: David  Ormond M.D.   On: 12/10/2021 11:06 DG Foot 2 Views Left CLINICAL DATA:  Provided indication is possible DVT.  EXAM: LEFT FOOT - 2 VIEW  COMPARISON:  None Available.  FINDINGS: No fracture or bone lesion.  Joints normally spaced and aligned. Skeletal structures are diffusely demineralized.  Soft tissue edema of the lower leg and ankle, nonspecific.  IMPRESSION: No fracture or dislocation.  No bone lesion.  Electronically Signed   By: David  Ormond M.D.   On: 12/10/2021 11:05 DG Tibia/Fibula Left CLINICAL DATA:  Provided indication is possible DVT.  EXAM: LEFT TIBIA AND FIBULA - 2 VIEW  COMPARISON:  None Available.  FINDINGS: No fracture or bone lesion.  Knee and ankle joints are normally aligned.  Subcutaneous soft tissue edema noted along the lower leg and  ankle.  IMPRESSION: No fracture, bone lesion or significant joint abnormality.  Electronically Signed   By: David  Ormond M.D.   On: 12/10/2021 11:04  Note: Reviewed        Physical Exam  General appearance: Well nourished, well developed, and well hydrated. In no apparent acute distress Mental status: Alert, oriented x 3 (person, place, & time)       Respiratory: No evidence of acute respiratory distress Eyes: PERLA Vitals: BP (!) 161/91   Pulse 76   Temp (!) 97.4 F (36.3 C)   Ht 5' 5" (1.651 m)   Wt 150 lb (68 kg)   SpO2 100%   BMI 24.96 kg/m  BMI: Estimated body mass index is 24.96 kg/m as calculated from the following:   Height as of this encounter: 5' 5" (1.651 m).   Weight as of this encounter: 150 lb (68 kg). Ideal: Ideal body weight: 57 kg (125 lb 10.6 oz) Adjusted ideal body weight: 61.4 kg (135 lb 6.4 oz)      Lumbar Spine Area Exam  Skin & Axial Inspection: No masses, redness, or swelling Alignment: Symmetrical Functional ROM: Pain restricted ROM       Stability: No instability detected Muscle Tone/Strength: Functionally intact. No obvious neuro-muscular anomalies detected. Sensory (Neurological): Musculoskeletal pain pattern   Ambulation: Limited Gait: Antalgic Posture: WNL  Lower Extremity Exam      Side: Right lower extremity   Side: Left lower extremity  Stability: No instability observed           Stability: No instability observed          Skin & Extremity Inspection: Evidence of prior arthroplastic surgery   Skin & Extremity Inspection: Skin color, temperature, and hair growth are WNL. No peripheral edema or cyanosis. No masses, redness, swelling, asymmetry, or associated skin lesions. No contractures.  Functional ROM: Pain restricted ROM hip                    Functional ROM: Unrestricted ROM                  Muscle Tone/Strength: Functionally intact. No obvious neuro-muscular anomalies detected.   Muscle Tone/Strength: Functionally intact. No  obvious neuro-muscular anomalies detected.  Sensory (Neurological): Musculoskeletal pain pattern      right hip bursitis   Sensory (Neurological): Unimpaired        DTR: Patellar: deferred today Achilles: deferred today Plantar: deferred today   DTR: Patellar: deferred today Achilles: deferred today Plantar: deferred today  Palpation: No palpable anomalies   Palpation: No palpable anomalies         Assessment   Status Diagnosis  Having a Flare-up Controlled Controlled 1. 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 Sacroiliac joint dysfunction of both sides   3. Facet syndrome, lumbar   4. Lumbar radiculopathy   5. Chronic pain syndrome   6. Trochanteric bursitis of right hip           Plan of Care   Requested Prescriptions   Signed Prescriptions Disp Refills   traMADol (ULTRAM) 50 MG tablet 90 tablet 2    Sig: Take 1 tablet (50 mg total) by mouth every 8 (eight) hours as needed.   Orders Placed This Encounter  Procedures   HIP INJECTION    Standing Status:   Future    Standing Expiration Date:   09/12/2022    Scheduling Instructions:     Side: RIGHT hip bursa     Sedation: without     Timeframe: As soon as schedule allows     Follow-up plan:   Return in about 13 days (around 06/25/2022) for Right hip bursa injection, in clinic [facility].    Recent Visits No visits were found meeting these conditions. Showing recent visits within past 90 days and meeting all other requirements Today's Visits Date Type Provider Dept  06/12/22 Office Visit Gillis Santa, MD [facility]  Showing today's visits and meeting all other requirements Future Appointments No visits were found meeting these conditions. Showing future appointments within next 90 days and meeting all other requirements  I discussed the assessment and treatment plan with the patient. The patient was provided an opportunity to ask questions and all were answered. The patient agreed with the plan  and demonstrated an understanding of the instructions.  Patient advised to call back or seek an in-person evaluation if the symptoms or condition worsens.  Duration of encounter: 13mnutes.  Note by: BGillis Santa MD Date: 06/12/2022; Time: 3:23 PM

## 2022-06-13 ENCOUNTER — Other Ambulatory Visit: Payer: Self-pay | Admitting: Student in an Organized Health Care Education/Training Program

## 2022-06-13 DIAGNOSIS — M47816 Spondylosis without myelopathy or radiculopathy, lumbar region: Secondary | ICD-10-CM

## 2022-06-13 DIAGNOSIS — M5416 Radiculopathy, lumbar region: Secondary | ICD-10-CM

## 2022-06-13 DIAGNOSIS — Z96649 Presence of unspecified artificial hip joint: Secondary | ICD-10-CM

## 2022-06-13 DIAGNOSIS — G894 Chronic pain syndrome: Secondary | ICD-10-CM

## 2022-06-13 DIAGNOSIS — M533 Sacrococcygeal disorders, not elsewhere classified: Secondary | ICD-10-CM

## 2022-06-27 ENCOUNTER — Ambulatory Visit
Payer: Medicare Other | Attending: Student in an Organized Health Care Education/Training Program | Admitting: Student in an Organized Health Care Education/Training Program

## 2022-06-27 ENCOUNTER — Encounter: Payer: Self-pay | Admitting: Student in an Organized Health Care Education/Training Program

## 2022-06-27 ENCOUNTER — Ambulatory Visit
Admission: RE | Admit: 2022-06-27 | Discharge: 2022-06-27 | Disposition: A | Discharge status: Home / Self Care | Payer: Medicare Other | Source: Ambulatory Visit | Attending: Student in an Organized Health Care Education/Training Program | Admitting: Student in an Organized Health Care Education/Training Program

## 2022-06-27 VITALS — BP 165/74 | HR 89 | Temp 97.4°F | Resp 18 | Ht 65.0 in | Wt 155.0 lb

## 2022-06-27 DIAGNOSIS — M7061 Trochanteric bursitis, right hip: Secondary | ICD-10-CM | POA: Diagnosis present

## 2022-06-27 DIAGNOSIS — Z96649 Presence of unspecified artificial hip joint: Secondary | ICD-10-CM | POA: Diagnosis present

## 2022-06-27 DIAGNOSIS — T8484XS Pain due to internal orthopedic prosthetic devices, implants and grafts, sequela: Secondary | ICD-10-CM | POA: Insufficient documentation

## 2022-06-27 MED ORDER — ROPIVACAINE HCL 2 MG/ML IJ SOLN
INTRAMUSCULAR | Status: AC
  Filled 2022-06-27: qty 20

## 2022-06-27 MED ORDER — LIDOCAINE HCL 2 % IJ SOLN
20.0000 mL | Freq: Once | INTRAMUSCULAR | Status: AC
  Administered 2022-06-27: 400 mg

## 2022-06-27 MED ORDER — METHYLPREDNISOLONE ACETATE 40 MG/ML IJ SUSP
INTRAMUSCULAR | Status: AC
  Filled 2022-06-27: qty 1

## 2022-06-27 MED ORDER — LIDOCAINE HCL 2 % IJ SOLN
INTRAMUSCULAR | Status: AC
  Filled 2022-06-27: qty 20

## 2022-06-27 MED ORDER — IOHEXOL 180 MG/ML  SOLN
10.0000 mL | Freq: Once | INTRAMUSCULAR | Status: AC
  Administered 2022-06-27: 10 mL via INTRATHECAL

## 2022-06-27 MED ORDER — ROPIVACAINE HCL 2 MG/ML IJ SOLN
4.0000 mL | Freq: Once | INTRAMUSCULAR | Status: AC
  Administered 2022-06-27: 4 mL via INTRA_ARTICULAR

## 2022-06-27 MED ORDER — METHYLPREDNISOLONE ACETATE 40 MG/ML IJ SUSP
40.0000 mg | Freq: Once | INTRAMUSCULAR | Status: AC
  Administered 2022-06-27: 40 mg via INTRA_ARTICULAR

## 2022-06-27 MED ORDER — IOHEXOL 180 MG/ML  SOLN
INTRAMUSCULAR | Status: AC
  Filled 2022-06-27: qty 20

## 2022-06-27 NOTE — Patient Instructions (Signed)

## 2022-06-27 NOTE — Progress Notes (Signed)
Safety precautions to be maintained throughout the outpatient stay will include: orient to surroundings, keep bed in low position, maintain call bell within reach at all times, provide assistance with transfer out of bed and ambulation.  

## 2022-06-27 NOTE — Progress Notes (Signed)
PROVIDER NOTE: Interpretation of information contained herein should be left to medically-trained personnel. Specific patient instructions are provided elsewhere under "Patient Instructions" section of medical record. This document was created in part using STT-dictation technology, any transcriptional errors that may result from this process are unintentional.  Patient: Marilyn Romero Type: Established DOB: [birth year] MRN: [id number] PCP: Gayland Curry, MD  Service: Procedure DOS: 06/27/2022 Setting: Ambulatory Location: Ambulatory outpatient facility Delivery: Face-to-face Provider: Gillis Santa, MD Specialty: Interventional Pain Management Specialty designation: 09 Location: Outpatient facility Ref. Prov.: Gayland Curry, MD    Primary Reason for Visit: Interventional Pain Management Treatment. CC: Hip Pain (right)    Procedure #1:  Anesthesia, Analgesia, Anxiolysis:  Type: Hip bursa injection #1  Primary Purpose: Diagnostic Region: Upper (proximal) Femoral Region Level: Hip Joint Target Area: Trochanteric Bursa Approach: Anterior approach Laterality: Right  Type: Local Anesthesia Local Anesthetic: Lidocaine 1-2%   Position: Supine   1. 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 Trochanteric bursitis of right hip    NAS-11 Pain score:   Pre-procedure: 8 /10   Post-procedure: 0-No pain/10     Pre-op H&P Assessment:  Marilyn Romero is a 88 y.o. (year old), female patient, seen today for interventional treatment. She  has a past surgical history that includes Colon resection; Hip surgery (Right); Lung surgery; Colonoscopy with propofol (N/A, 07/02/2016); and Esophagogastroduodenoscopy (egd) with propofol (N/A, 07/02/2016). Marilyn Romero has a current medication list which includes the following prescription(s): atorvastatin, ibuprofen, and [START ON 06/29/2022] tramadol. Her primarily concern today is the Hip Pain (right)  Initial Vital Signs:  Pulse/HCG Rate: 89ECG Heart  Rate: 79 Temp: (!) 97.4 F (36.3 C) Resp: 18 BP: (!) 170/84 SpO2: 96 %  BMI: Estimated body mass index is 25.79 kg/m as calculated from the following:   Height as of this encounter: '5\' 5"'$  (1.651 m).   Weight as of this encounter: 155 lb (70.3 kg).  Risk Assessment: Allergies: Reviewed. She has No Known Allergies.  Allergy Precautions: None required Coagulopathies: Reviewed. None identified.  Blood-thinner therapy: None at this time Active Infection(s): Reviewed. None identified. Marilyn Romero is afebrile  Site Confirmation: Marilyn Romero was asked to confirm the procedure and laterality before marking the site Procedure checklist: Completed Consent: Before the procedure and under the influence of no sedative(s), amnesic(s), or anxiolytics, the patient was informed of the treatment options, risks and possible complications. To fulfill our ethical and legal obligations, as recommended by the American Medical Association's Code of Ethics, I have informed the patient of my clinical impression; the nature and purpose of the treatment or procedure; the risks, benefits, and possible complications of the intervention; the alternatives, including doing nothing; the risk(s) and benefit(s) of the alternative treatment(s) or procedure(s); and the risk(s) and benefit(s) of doing nothing. The patient was provided information about the general risks and possible complications associated with the procedure. These may include, but are not limited to: failure to achieve desired goals, infection, bleeding, organ or nerve damage, allergic reactions, paralysis, and death. In addition, the patient was informed of those risks and complications associated to the procedure, such as failure to decrease pain; infection; bleeding; organ or nerve damage with subsequent damage to sensory, motor, and/or autonomic systems, resulting in permanent pain, numbness, and/or weakness of one or several areas of the body; allergic reactions;  (i.e.: anaphylactic reaction); and/or death. Furthermore, the patient was informed of those risks and complications associated with the medications. These include, but are not limited to: allergic  reactions (i.e.: anaphylactic or anaphylactoid reaction(s)); adrenal axis suppression; blood sugar elevation that in diabetics may result in ketoacidosis or comma; water retention that in patients with history of congestive heart failure may result in shortness of breath, pulmonary edema, and decompensation with resultant heart failure; weight gain; swelling or edema; medication-induced neural toxicity; particulate matter embolism and blood vessel occlusion with resultant organ, and/or nervous system infarction; and/or aseptic necrosis of one or more joints. Finally, the patient was informed that Medicine is not an exact science; therefore, there is also the possibility of unforeseen or unpredictable risks and/or possible complications that may result in a catastrophic outcome. The patient indicated having understood very clearly. We have given the patient no guarantees and we have made no promises. Enough time was given to the patient to ask questions, all of which were answered to the patient's satisfaction. Marilyn Romero has indicated that she wanted to continue with the procedure. Attestation: I, the ordering provider, attest that I have discussed with the patient the benefits, risks, side-effects, alternatives, likelihood of achieving goals, and potential problems during recovery for the procedure that I have provided informed consent. Date  Time: 06/27/2022 11:02 AM  Pre-Procedure Preparation:  Monitoring: As per clinic protocol. Respiration, ETCO2, SpO2, BP, heart rate and rhythm monitor placed and checked for adequate function Safety Precautions: Patient was assessed for positional comfort and pressure points before starting the procedure. Time-out: I initiated and conducted the "Time-out" before starting the  procedure, as per protocol. The patient was asked to participate by confirming the accuracy of the "Time Out" information. Verification of the correct person, site, and procedure were performed and confirmed by me, the nursing staff, and the patient. "Time-out" conducted as per Joint Commission's Universal Protocol (UP.01.01.01). Time: 1147  Description of Procedure:          Area Prepped: Entire Anterolateral hip area. DuraPrep (Iodine Povacrylex [0.7% available iodine] and Isopropyl Alcohol, 74% w/w) Safety Precautions: Aspiration looking for blood return was conducted prior to all injections. At no point did we inject any substances, as a needle was being advanced. No attempts were made at seeking any paresthesias. Safe injection practices and needle disposal techniques used. Medications properly checked for expiration dates. SDV (single dose vial) medications used. Description of the Procedure: Protocol guidelines were followed. The patient was placed in position over the procedure table. The target area was identified and the area prepped in the usual manner. Skin & deeper tissues infiltrated with local anesthetic. Appropriate amount of time allowed to pass for local anesthetics to take effect. The procedure needles were then advanced to the target area. Proper needle placement secured. Negative aspiration confirmed. Solution injected in intermittent fashion, asking for systemic symptoms every 0.5cc of injectate. The needles were then removed and the area cleansed, making sure to leave some of the prepping solution back to take advantage of its long term bactericidal properties. Vitals:   06/27/22 1102 06/27/22 1147 06/27/22 1151  BP: (!) 170/84 (!) 168/74 (!) 165/74  Pulse: 89    Resp:  18 18  Temp: (!) 97.4 F (36.3 C)    TempSrc: Temporal    SpO2: 96% 100% 100%  Weight: 155 lb (70.3 kg)    Height: '5\' 5"'$  (1.651 m)      Start Time: 1147 hrs. End Time: 1151 hrs.           Materials:   Needle(s) Type: Spinal Needle Gauge: 22G Length: 3.5-in Medication(s): Please see orders for medications and dosing details.  5 cc solution made of 4cc of 0.2% ropivacaine, 1 cc of methylprednisolone (40 mg/cc), injected after contrast spread   Imaging Guidance (Non-Spinal):          Type of Imaging Technique: Fluoroscopy Guidance (Non-Spinal) Indication(s): Assistance in needle guidance and placement for procedures requiring needle placement in or near specific anatomical locations not easily accessible without such assistance. Exposure Time: Please see nurses notes. Contrast: Before injecting any contrast, we confirmed that the patient did not have an allergy to iodine, shellfish, or radiological contrast. Once satisfactory needle placement was completed at the desired level, radiological contrast was injected. Contrast injected under live fluoroscopy. No contrast complications. See chart for type and volume of contrast used. Fluoroscopic Guidance: I was personally present during the use of fluoroscopy. "Tunnel Vision Technique" used to obtain the best possible view of the target area. Parallax error corrected before commencing the procedure. "Direction-depth-direction" technique used to introduce the needle under continuous pulsed fluoroscopy. Once target was reached, antero-posterior, oblique, and lateral fluoroscopic projection used confirm needle placement in all planes. Images permanently stored in EMR. Interpretation: I personally interpreted the imaging intraoperatively. Adequate needle placement confirmed in multiple planes. Appropriate spread of contrast into desired area was observed. No evidence of afferent or efferent intravascular uptake. Permanent images saved into the patient's record.  Antibiotic Prophylaxis:   Anti-infectives (From admission, onward)    None      Indication(s): None identified  Post-operative Assessment:  Post-procedure Vital Signs:  Pulse/HCG Rate:  8978 Temp: (!) 97.4 F (36.3 C) Resp: 18 BP: (!) 165/74 SpO2: 100 %  EBL: None  Complications: No immediate post-treatment complications observed by team, or reported by patient.  Note: The patient tolerated the entire procedure well. A repeat set of vitals were taken after the procedure and the patient was kept under observation following institutional policy, for this type of procedure. Post-procedural neurological assessment was performed, showing return to baseline, prior to discharge. The patient was provided with post-procedure discharge instructions, including a section on how to identify potential problems. Should any problems arise concerning this procedure, the patient was given instructions to immediately contact us, at any time, without hesitation. In any case, we plan to contact the patient by telephone for a follow-up status report regarding this interventional procedure.  Comments:  No additional relevant information.  Plan of Care  Orders:  Orders Placed This Encounter  Procedures   DG PAIN CLINIC C-ARM 1-60 MIN NO REPORT    Intraoperative interpretation by procedural physician at [place].    Standing Status:   Standing    Number of Occurrences:   1    Order Specific Question:   Reason for exam:    Answer:   Assistance in needle guidance and placement for procedures requiring needle placement in or near specific anatomical locations not easily accessible without such assistance.   Chronic Opioid Analgesic:  Tramadol 50 mg TID prn   Medications ordered for procedure: Meds ordered this encounter  Medications   iohexol (OMNIPAQUE) 180 MG/ML injection 10 mL    Must be Myelogram-compatible. If not available, you may substitute with a water-soluble, non-ionic, hypoallergenic, myelogram-compatible radiological contrast medium.   lidocaine (XYLOCAINE) 2 % (with pres) injection 400 mg   methylPREDNISolone acetate (DEPO-MEDROL) injection 40 mg   ropivacaine  (PF) 2 mg/mL (0.2%) (NAROPIN) injection 4 mL   Medications administered: We administered iohexol, lidocaine, methylPREDNISolone acetate, and ropivacaine (PF) 2 mg/mL (0.2%).  See the medical record for exact dosing, route,  and time of administration.  Follow-up plan:   Return in about 5 weeks (around 08/01/2022), or PPE virtual.       Right hip bursa injection 06/27/22   Recent Visits Date Type Provider Dept  06/12/22 Office Visit Gillis Santa, MD [facility]  Showing recent visits within past 90 days and meeting all other requirements Today's Visits Date Type Provider Dept  06/27/22 Procedure visit Gillis Santa, MD [facility]  Showing today's visits and meeting all other requirements Future Appointments Date Type Provider Dept  09/18/22 Appointment Gillis Santa, MD [facility]  Showing future appointments within next 90 days and meeting all other requirements  Disposition: Discharge home  Discharge (Date  Time): 06/27/2022; 1200 hrs.   Primary Care Physician: Gayland Curry, MD Location: [facility] Note by: Gillis Santa, MD Date: 06/27/2022; Time: 11:56 AM  Disclaimer:  Medicine is not an exact science. The only guarantee in medicine is that nothing is guaranteed. It is important to note that the decision to proceed with this intervention was based on the information collected from the patient. The Data and conclusions were drawn from the patient's questionnaire, the interview, and the physical examination. Because the information was provided in large part by the patient, it cannot be guaranteed that it has not been purposely or unconsciously manipulated. Every effort has been made to obtain as much relevant data as possible for this evaluation. It is important to note that the conclusions that lead to this procedure are derived in large part from the available data. Always take into account that the treatment will  also be dependent on availability of resources and existing treatment guidelines, considered by other Pain Management Practitioners as being common knowledge and practice, at the time of the intervention. For Medico-Legal purposes, it is also important to point out that variation in procedural techniques and pharmacological choices are the acceptable norm. The indications, contraindications, technique, and results of the above procedure should only be interpreted and judged by a Board-Certified Interventional Pain Specialist with extensive familiarity and expertise in the same exact procedure and technique.

## 2022-06-28 ENCOUNTER — Telehealth: Payer: Self-pay

## 2022-06-28 NOTE — Telephone Encounter (Signed)
Post procedure phone call.  LM 

## 2022-07-31 ENCOUNTER — Ambulatory Visit
Payer: Medicare Other | Attending: Student in an Organized Health Care Education/Training Program | Admitting: Student in an Organized Health Care Education/Training Program

## 2022-07-31 DIAGNOSIS — Z9889 Other specified postprocedural states: Secondary | ICD-10-CM

## 2022-07-31 NOTE — Progress Notes (Signed)
I attempted to call the patient however no response. Voicemail left instructing patient to call front desk office at [phone] to reschedule appointment. -Dr Holley Raring   HPI  Today, she is being contacted for a post-procedure assessment.   Post-procedure evaluation    Procedure #1:  Anesthesia, Analgesia, Anxiolysis:  Type: Hip bursa injection #1  Primary Purpose: Diagnostic Region: Upper (proximal) Femoral Region Level: Hip Joint Target Area: Trochanteric Bursa Approach: Anterior approach Laterality: Right  Type: Local Anesthesia Local Anesthetic: Lidocaine 1-2%   Position: Supine   1. 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 Trochanteric bursitis of right hip    NAS-11 Pain score:   Pre-procedure: 8 /10   Post-procedure: 0-No pain/10      Effectiveness:  Initial hour after procedure: 100 %  Subsequent 4-6 hours post-procedure: 50 %  Analgesia past initial 6 hours: 0 %  Ongoing improvement:  Analgesic:  0% Function: No benefit ROM: No benefit

## 2022-08-29 DIAGNOSIS — R531 Weakness: Secondary | ICD-10-CM | POA: Insufficient documentation

## 2022-08-29 DIAGNOSIS — E782 Mixed hyperlipidemia: Secondary | ICD-10-CM | POA: Insufficient documentation

## 2022-09-18 ENCOUNTER — Encounter: Payer: Medicare Other | Admitting: Student in an Organized Health Care Education/Training Program

## 2022-10-30 ENCOUNTER — Emergency Department: Payer: 59

## 2022-10-30 ENCOUNTER — Emergency Department
Admission: EM | Admit: 2022-10-30 | Discharge: 2022-10-31 | Disposition: A | Discharge status: Home / Self Care | Payer: 59 | Attending: Emergency Medicine | Admitting: Emergency Medicine

## 2022-10-30 ENCOUNTER — Other Ambulatory Visit: Payer: Self-pay

## 2022-10-30 DIAGNOSIS — Z96641 Presence of right artificial hip joint: Secondary | ICD-10-CM | POA: Insufficient documentation

## 2022-10-30 DIAGNOSIS — M25532 Pain in left wrist: Secondary | ICD-10-CM | POA: Diagnosis not present

## 2022-10-30 DIAGNOSIS — S0990XA Unspecified injury of head, initial encounter: Secondary | ICD-10-CM | POA: Insufficient documentation

## 2022-10-30 DIAGNOSIS — S73004A Unspecified dislocation of right hip, initial encounter: Secondary | ICD-10-CM

## 2022-10-30 DIAGNOSIS — T84020A Dislocation of internal right hip prosthesis, initial encounter: Secondary | ICD-10-CM | POA: Insufficient documentation

## 2022-10-30 DIAGNOSIS — Y828 Other medical devices associated with adverse incidents: Secondary | ICD-10-CM | POA: Diagnosis not present

## 2022-10-30 DIAGNOSIS — S79911A Unspecified injury of right hip, initial encounter: Secondary | ICD-10-CM | POA: Diagnosis present

## 2022-10-30 LAB — BASIC METABOLIC PANEL
Anion gap: 8 (ref 5–15)
BUN: 10 mg/dL (ref 8–23)
CO2: 27 mmol/L (ref 22–32)
Calcium: 9.3 mg/dL (ref 8.9–10.3)
Chloride: 103 mmol/L (ref 98–111)
Creatinine, Ser: 0.62 mg/dL (ref 0.44–1.00)
GFR, Estimated: >60 mL/min (ref >60)
Glucose, Bld: 118 mg/dL — ABNORMAL HIGH (ref 70–99)
Potassium: 3.1 mmol/L — ABNORMAL LOW (ref 3.5–5.1)
Sodium: 138 mmol/L (ref 135–145)

## 2022-10-30 LAB — CBC WITH DIFFERENTIAL/PLATELET
Abs Immature Granulocytes: 0.02 10*3/uL (ref 0.00–0.07)
Basophils Absolute: 0.1 10*3/uL (ref 0.0–0.1)
Basophils Relative: 1 %
Eosinophils Absolute: 0.2 10*3/uL (ref 0.0–0.5)
Eosinophils Relative: 3 %
HCT: 37.7 % (ref 36.0–46.0)
Hemoglobin: 12.1 g/dL (ref 12.0–15.0)
Immature Granulocytes: 0 %
Lymphocytes Relative: 15 %
Lymphs Abs: 1.2 10*3/uL (ref 0.7–4.0)
MCH: 28.4 pg (ref 26.0–34.0)
MCHC: 32.1 g/dL (ref 30.0–36.0)
MCV: 88.5 fL (ref 80.0–100.0)
Monocytes Absolute: 1 10*3/uL (ref 0.1–1.0)
Monocytes Relative: 12 %
Neutro Abs: 5.5 10*3/uL (ref 1.7–7.7)
Neutrophils Relative %: 69 %
Platelets: 441 10*3/uL — ABNORMAL HIGH (ref 150–400)
RBC: 4.26 MIL/uL (ref 3.87–5.11)
RDW: 14.4 % (ref 11.5–15.5)
WBC: 8 10*3/uL (ref 4.0–10.5)
nRBC: 0 % (ref 0.0–0.2)

## 2022-10-30 MED ORDER — SODIUM CHLORIDE 0.9 % IV BOLUS
1000.0000 mL | Freq: Once | INTRAVENOUS | Status: AC
  Administered 2022-10-30: 1000 mL via INTRAVENOUS

## 2022-10-30 MED ORDER — TRAMADOL HCL 50 MG PO TABS
50.0000 mg | ORAL_TABLET | Freq: Once | ORAL | Status: AC
  Administered 2022-10-30: 50 mg via ORAL
  Filled 2022-10-30: qty 1

## 2022-10-30 MED ORDER — PROPOFOL 10 MG/ML IV BOLUS
70.0000 mg | Freq: Once | INTRAVENOUS | Status: AC
  Administered 2022-10-30: 70 mg via INTRAVENOUS
  Filled 2022-10-30: qty 20

## 2022-10-30 MED ORDER — POTASSIUM CHLORIDE CRYS ER 20 MEQ PO TBCR
40.0000 meq | EXTENDED_RELEASE_TABLET | Freq: Once | ORAL | Status: AC
  Administered 2022-10-30: 40 meq via ORAL
  Filled 2022-10-30: qty 2

## 2022-10-30 MED ORDER — MORPHINE SULFATE (PF) 2 MG/ML IV SOLN
2.0000 mg | Freq: Once | INTRAVENOUS | Status: AC
  Administered 2022-10-30: 2 mg via INTRAVENOUS
  Filled 2022-10-30: qty 1

## 2022-10-30 MED ORDER — PROPOFOL 10 MG/ML IV BOLUS
20.0000 mg | Freq: Once | INTRAVENOUS | Status: AC
  Administered 2022-10-30: 20 mg via INTRAVENOUS

## 2022-10-30 MED ORDER — ONDANSETRON HCL 4 MG/2ML IJ SOLN
4.0000 mg | Freq: Once | INTRAMUSCULAR | Status: AC
  Administered 2022-10-30: 4 mg via INTRAVENOUS
  Filled 2022-10-30: qty 2

## 2022-10-30 NOTE — ED Notes (Signed)
At this time daughter of patient called back and MD Issacs is getting consent from daughter for sedation.

## 2022-10-30 NOTE — ED Provider Notes (Signed)
[facility] Provider Note    Event Date/Time   First MD Initiated Contact with Patient 10/30/22 2020     (approximate)   History   Right Hip Dislocation   HPI  Marilyn Romero is a 89 y.o. female  here with hip pain. Pt has a h/o recurrent hip dislocations. She was recently placed in an orthotic brace and just took it off a month ago per report. She reportedly didn't show up for dinner tonight and was found with hip deformity. She also c/o left wrist pain. She says she didn't fall but is slightly confused at baseline. No other complaints. Denies any numbness or weakness.      Physical Exam   Triage Vital Signs: ED Triage Vitals  Enc Vitals Group     BP 10/30/22 2044 (!) 147/82     Pulse Rate 10/30/22 2044 91     Resp 10/30/22 2044 17     Temp 10/30/22 2044 98.4 F (36.9 C)     Temp Source 10/30/22 2044 Oral     SpO2 10/30/22 2044 97 %     Weight 10/30/22 2025 154 lb 15.7 oz (70.3 kg)     Height 10/30/22 2025 5\' 5"  (1.651 m)     Head Circumference --      Peak Flow --      Pain Score 10/30/22 2024 7     Pain Loc --      Pain Edu? --      Excl. in [place]? --     Most recent vital signs: Vitals:   10/31/22 0025 10/31/22 0030  BP: (!) 177/91 (!) 177/91  Pulse:  78  Resp:  (!) 22  Temp:    SpO2:  98%     General: Awake, no distress.  CV:  Good peripheral perfusion.  Resp:  Normal work of breathing.  Abd:  No distention.  Other:  Obvious deformity to the R hip with shortening and internal rotation. Pain with any ROM. Distal sensation and strength intact RLE.   ED Results / Procedures / Treatments   Labs (all labs ordered are listed, but only abnormal results are displayed) Labs Reviewed  CBC WITH DIFFERENTIAL/PLATELET - Abnormal; Notable for the following components:      Result Value   Platelets 441 (*)    All other components within normal limits  BASIC METABOLIC PANEL - Abnormal; Notable for the following components:   Potassium  3.1 (*)    Glucose, Bld 118 (*)    All other components within normal limits     EKG    RADIOLOGY DG Pelvis: R hip dislocation R Wrist: Degen arthritis DG Forearm R: Negative CT Head/C Spine: no acute abnormality, chornic changes noted   I also independently reviewed and agree with radiologist interpretations.   PROCEDURES:  Critical Care performed: No  .Sedation  Date/Time: 10/30/2022 10:32 PM  Performed by: Duffy Bruce, MD Authorized by: Duffy Bruce, MD   Consent:    Consent obtained:  Emergent situation   Alternatives discussed:  Analgesia without sedation Universal protocol:    Immediately prior to procedure, a time out was called: yes   Indications:    Procedure necessitating sedation performed by:  Physician performing sedation Pre-sedation assessment:    Time since last food or drink:  4   NPO status caution: urgency dictates proceeding with non-ideal NPO status     ASA classification: class 2 - patient with mild systemic disease  Mallampati score:  I - soft palate, uvula, fauces, pillars visible   Neck mobility: normal     Pre-sedation assessments completed and reviewed: airway patency, cardiovascular function, hydration status, mental status, nausea/vomiting, pain level, respiratory function and temperature   Immediate pre-procedure details:    Reassessment: Patient reassessed immediately prior to procedure     Reviewed: vital signs     Verified: bag valve mask available, emergency equipment available, intubation equipment available, IV patency confirmed, oxygen available, reversal medications available and suction available   Procedure details (see MAR for exact dosages):    Preoxygenation:  Nasal cannula   Sedation:  Propofol   Intended level of sedation: deep   Intra-procedure monitoring:  Blood pressure monitoring, cardiac monitor, frequent vital sign checks, frequent LOC assessments, continuous pulse oximetry and continuous capnometry    Total Provider sedation time (minutes):  15 Post-procedure details:    Attendance: Constant attendance by certified staff until patient recovered     Post-sedation assessments completed and reviewed: airway patency, cardiovascular function, hydration status, mental status, nausea/vomiting, pain level, respiratory function and temperature     Patient is stable for discharge or admission: yes     Procedure completion:  Tolerated well, no immediate complications .Ortho Injury Treatment  Date/Time: 10/30/2022 10:33 PM  Performed by: Duffy Bruce, MD Authorized by: Duffy Bruce, MD   Consent:    Consent obtained:  Emergent situation   Risks discussed:  Fracture, irreducible dislocation, restricted joint movement, stiffness, recurrent dislocation and nerve damageInjury location: hip Location details: right hip Injury type: dislocation Dislocation type: posterior Spontaneous dislocation: yes Prosthesis: yes Pre-procedure neurovascular assessment: neurovascularly intact Pre-procedure distal perfusion: normal Pre-procedure neurological function: normal Pre-procedure range of motion: reduced Manipulation performed: yes Reduction successful: yes X-ray confirmed reduction: yes Immobilization: splint, cast, sling, brace, interdental fixation, tape and crutches Splint Applied by: ED Provider Post-procedure neurovascular assessment: post-procedure neurovascularly intact Post-procedure distal perfusion: normal Post-procedure neurological function: normal Post-procedure range of motion: normal       MEDICATIONS ORDERED IN ED: Medications  sodium chloride 0.9 % bolus 1,000 mL (0 mLs Intravenous Stopped 10/30/22 2244)  ondansetron (ZOFRAN) injection 4 mg (4 mg Intravenous Given 10/30/22 2144)  morphine (PF) 2 MG/ML injection 2 mg (2 mg Intravenous Given 10/30/22 2148)  propofol (DIPRIVAN) 10 mg/mL bolus/IV push 70 mg (70 mg Intravenous Given 10/30/22 2327)  traMADol (ULTRAM) tablet 50 mg (50 mg  Oral Given 10/30/22 2352)  propofol (DIPRIVAN) 10 mg/mL bolus/IV push 20 mg (20 mg Intravenous Given 10/30/22 2311)  potassium chloride SA (KLOR-CON M) CR tablet 40 mEq (40 mEq Oral Given 10/30/22 2352)     IMPRESSION / MDM / [place] / ED COURSE  I reviewed the triage vital signs and the nursing notes.                              Differential diagnosis includes, but is not limited to, hip dislocation, fracture  Patient's presentation is most consistent with acute presentation with potential threat to life or bodily function.  The patient is on the cardiac monitor to evaluate for evidence of arrhythmia and/or significant heart rate changes  89 yo F here with spontaneous dislocation of R hip. Pt has a h/o recurrent dislocation and just took off her abduction splint this week. She is NV intact distally. There was a ? Of whether she fell so CT imaging, plain films of any areas of pain obtained, reviewed, and  are negative. Screening labs unremarkable. Discussed dislocation with pt and pt's daughter via telephone, and pt sedated and reduced as above. Tolerated well. She was placed back into her abduction splint. Will have her keep this on until ortho f/u. Will schedule her tramadol at her facility and advise OTC tylenol prn. No other apparent emergent pathology.  FINAL CLINICAL IMPRESSION(S) / ED DIAGNOSES   Final diagnoses:  Hip dislocation, right, initial encounter     Rx / DC Orders   ED Discharge Orders          Ordered    traMADol (ULTRAM) 50 MG tablet        10/31/22 0001    potassium chloride SA (KLOR-CON M) 20 MEQ tablet  Daily        10/31/22 0001             Note:  This document was prepared using Dragon voice recognition software and may include unintentional dictation errors.   Duffy Bruce, MD 10/31/22 [id number]

## 2022-10-30 NOTE — Sedation Documentation (Signed)
20mg propofol IV given 

## 2022-10-30 NOTE — Sedation Documentation (Addendum)
20 mg Propofol IV given

## 2022-10-30 NOTE — Sedation Documentation (Signed)
20 mg propofol IV

## 2022-10-30 NOTE — ED Notes (Signed)
MD Issacs and this RN both attempted at separate times to call this patients daughter to get consent to do sedation procedure for R hip dislocation.

## 2022-10-30 NOTE — Sedation Documentation (Addendum)
Portable Xray at bedside at this time.  

## 2022-10-30 NOTE — ED Triage Notes (Addendum)
Pt came in via [facility] from [facility] [place]. Pt recently had R hip dislocation and was put in a brace. Pt, family and facility were told to have the patient wear the brace for approximately two weeks and then they would be able to remove it. They removed the hip brace this past weekend and pt was walking around today. When patient did not show up to dinner tonight staff checked on the patient and found her with a R hip dislocation and leg turned inwards. Patient denies any falls and staff is unaware of any falls as well. Daughter was notified and will be coming to hospital. EMS BP 126/63. On 08/29/22 had hip dislocation at [facility] and had a successful reduction at that time.

## 2022-10-31 MED ORDER — TRAMADOL HCL 50 MG PO TABS: ORAL_TABLET | ORAL | 0 refills | Status: DC

## 2022-10-31 MED ORDER — POTASSIUM CHLORIDE CRYS ER 20 MEQ PO TBCR: 20.0000 meq | EXTENDED_RELEASE_TABLET | Freq: Every day | ORAL | 0 refills | Status: DC

## 2022-10-31 NOTE — Discharge Instructions (Addendum)
Keep the brace on AT ALL TIMES until follow-up with Orthopedics in the next week  Take your TRAMADOL twice a day SCHEDULED for the next several days for pain  Take Tylenol and/or Ibuprofen if able for mild pain

## 2023-01-02 ENCOUNTER — Emergency Department: Payer: 59

## 2023-01-02 ENCOUNTER — Emergency Department
Admission: EM | Admit: 2023-01-02 | Discharge: 2023-01-02 | Disposition: A | Discharge status: Home / Self Care | Payer: 59 | Attending: Emergency Medicine | Admitting: Emergency Medicine

## 2023-01-02 DIAGNOSIS — S73004A Unspecified dislocation of right hip, initial encounter: Secondary | ICD-10-CM | POA: Diagnosis not present

## 2023-01-02 DIAGNOSIS — X58XXXA Exposure to other specified factors, initial encounter: Secondary | ICD-10-CM | POA: Diagnosis not present

## 2023-01-02 DIAGNOSIS — S73004S Unspecified dislocation of right hip, sequela: Secondary | ICD-10-CM

## 2023-01-02 DIAGNOSIS — M25551 Pain in right hip: Secondary | ICD-10-CM | POA: Diagnosis present

## 2023-01-02 LAB — COMPREHENSIVE METABOLIC PANEL
ALT: 11 U/L (ref 0–44)
AST: 17 U/L (ref 15–41)
Albumin: 3.4 g/dL — ABNORMAL LOW (ref 3.5–5.0)
Alkaline Phosphatase: 72 U/L (ref 38–126)
Anion gap: 8 (ref 5–15)
BUN: 11 mg/dL (ref 8–23)
CO2: 25 mmol/L (ref 22–32)
Calcium: 9.1 mg/dL (ref 8.9–10.3)
Chloride: 107 mmol/L (ref 98–111)
Creatinine, Ser: 0.61 mg/dL (ref 0.44–1.00)
GFR, Estimated: >60 mL/min (ref >60)
Glucose, Bld: 112 mg/dL — ABNORMAL HIGH (ref 70–99)
Potassium: 3.3 mmol/L — ABNORMAL LOW (ref 3.5–5.1)
Sodium: 140 mmol/L (ref 135–145)
Total Bilirubin: 1.1 mg/dL (ref 0.3–1.2)
Total Protein: 6.5 g/dL (ref 6.5–8.1)

## 2023-01-02 LAB — CBC
HCT: 41.3 % (ref 36.0–46.0)
Hemoglobin: 13.2 g/dL (ref 12.0–15.0)
MCH: 28.1 pg (ref 26.0–34.0)
MCHC: 32 g/dL (ref 30.0–36.0)
MCV: 88.1 fL (ref 80.0–100.0)
Platelets: 366 10*3/uL (ref 150–400)
RBC: 4.69 MIL/uL (ref 3.87–5.11)
RDW: 14.6 % (ref 11.5–15.5)
WBC: 7.2 10*3/uL (ref 4.0–10.5)
nRBC: 0 % (ref 0.0–0.2)

## 2023-01-02 MED ORDER — FENTANYL CITRATE PF 50 MCG/ML IJ SOSY
50.0000 ug | PREFILLED_SYRINGE | Freq: Once | INTRAMUSCULAR | Status: AC
  Administered 2023-01-02: 50 ug via INTRAVENOUS
  Filled 2023-01-02: qty 1

## 2023-01-02 MED ORDER — SODIUM CHLORIDE 0.9 % IV BOLUS
1000.0000 mL | Freq: Once | INTRAVENOUS | Status: AC
  Administered 2023-01-02: 1000 mL via INTRAVENOUS

## 2023-01-02 MED ORDER — PROPOFOL 10 MG/ML IV BOLUS
INTRAVENOUS | Status: AC | PRN
  Administered 2023-01-02: 15 mg via INTRAVENOUS
  Administered 2023-01-02 (×2): 26.6 mg via INTRAVENOUS

## 2023-01-02 MED ORDER — PROPOFOL 10 MG/ML IV BOLUS
0.5000 mg/kg | Freq: Once | INTRAVENOUS | Status: DC
  Filled 2023-01-02: qty 20

## 2023-01-02 NOTE — ED Triage Notes (Signed)
Patient arriving via EMS from [facility] C/O right leg pain. Patient states she has chronic pain in that leg, denies injury, but reports increased pain.

## 2023-01-02 NOTE — Discharge Instructions (Addendum)
Please keep your knee immobilizer in place to help prevent another hip dislocation.  Please call the number for orthopedic next to arrange an appointment for recheck/reevaluation.  Return to the emergency department for any return of or worsening pain.  Please use Tylenol as needed for discomfort.

## 2023-01-02 NOTE — ED Provider Notes (Signed)
[facility] Provider Note    Event Date/Time   First MD Initiated Contact with Patient 01/02/23 [phone]     (approximate)  History   Chief Complaint: Leg Pain  HPI  Marilyn Romero is a 89 y.o. female with a past medical history of gastric reflux, status post right hip replacement, SI joint dysfunction, presents to the emergency department for right hip pain.  According to the patient she has been experiencing right hip pain for "quite some time."  Patient states the pain has worsened recently which is what prompted today's ER visit.  Patient denies any falls.  Denies any other pain.  Physical Exam   Triage Vital Signs: ED Triage Vitals  Enc Vitals Group     BP 01/02/23 0656 (!) 176/83     Pulse Rate 01/02/23 0656 83     Resp 01/02/23 0656 18     Temp 01/02/23 0658 97.9 F (36.6 C)     Temp Source 01/02/23 0658 Oral     SpO2 01/02/23 0656 100 %     Weight 01/02/23 0655 117 lb 1 oz (53.1 kg)     Height --      Head Circumference --      Peak Flow --      Pain Score --      Pain Loc --      Pain Edu? --      Excl. in GC? --     Most recent vital signs: Vitals:   01/02/23 0656 01/02/23 0658  BP: (!) 176/83   Pulse: 83   Resp: 18   Temp:  97.9 F (36.6 C)  SpO2: 100%     General: Awake, no distress.  CV:  Good peripheral perfusion.  Regular rate and rhythm  Resp:  Normal effort.  Equal breath sounds bilaterally.  Abd:  No distention.  Soft, nontender.  No rebound or guarding. Other:  Mild tenderness to palpation of the right hip.  Moderate pain with attempted range of motion of the right hip.  Neurovascular intact distally.  Normal-appearing left lower extremity.   ED Results / Procedures / Treatments   RADIOLOGY  I reviewed and interpreted the right hip images.  Patient appears to have a dislocation of her prior hip replacement. Radiology confirms lateral dislocation of right hip prosthesis. Chest x-ray is negative  MEDICATIONS ORDERED  IN ED: Medications - No data to display   IMPRESSION / MDM / ASSESSMENT AND PLAN / ED COURSE  I reviewed the triage vital signs and the nursing notes.  Patient's presentation is most consistent with acute illness / injury with system symptoms.  Patient presents to the emergency department for right hip pain.  No injury.  Patient states chronic pain to the right hip but has been worsening.  Does have pain with range of motion mild tenderness to palpation.  Will obtain x-ray images of the right hip to further evaluate.  Patient denies any falls or trauma.  Patient's right hip x-ray is consistent with a right hip dislocation.  I reviewed the patient's chart she has a history of recurrent dislocations.  It appears to have last been reduced in April.  Patient appeared to do well with propofol and ED sedation and reduction during her last visit.  Attempted to contact the daughter but have been so far unsuccessful in reaching her.  I did discuss the dislocation and the need for sedation and reduction with the patient.  The patient is agreeable.  I was able to speak to the daughter on the phone after multiple attempts.  Consented for sedation and reduction.  Sedation performed with propofol, times we are finally able to reduce the right hip.  Confirmed with x-ray.  Knee immobilizer placed.  Will discharge back to the patient's nursing facility.  We have now monitored for several hours status post sedation she appears well she is awake alert answering questions.  We will leave the knee immobilizer in place until the patient follows up with orthopedics.  .Sedation  Date/Time: 01/02/2023 10:07 AM  Performed by: Minna Antis, MD Authorized by: Minna Antis, MD   Consent:    Consent obtained:  Verbal (verbal with daughter on phone, also discussed with patient)   Consent given by:  Patient (and daughter)   Risks discussed:  Prolonged hypoxia resulting in organ damage, respiratory compromise  necessitating ventilatory assistance and intubation, vomiting and nausea Universal protocol:    Immediately prior to procedure, a time out was called: yes     Patient identity confirmed:  Arm band and verbally with patient Indications:    Procedure performed:  Dislocation reduction   Procedure necessitating sedation performed by:  Physician performing sedation Pre-sedation assessment:    Time since last food or drink:  >6 hours per pt   ASA classification: class 2 - patient with mild systemic disease     Mallampati score:  II - soft palate, uvula, fauces visible   Neck mobility: reduced     Pre-sedation assessments completed and reviewed: airway patency, cardiovascular function, hydration status, mental status, pain level and respiratory function     Pre-sedation assessment completed:  01/02/2023 9:40 AM Immediate pre-procedure details:    Reassessment: Patient reassessed immediately prior to procedure     Reviewed: vital signs     Verified: bag valve mask available, emergency equipment available, intubation equipment available, IV patency confirmed, oxygen available and suction available   Procedure details (see MAR for exact dosages):    Preoxygenation:  Nasal cannula   Sedation:  Propofol   Intended level of sedation: deep   Analgesia:  Fentanyl   Intra-procedure monitoring:  Blood pressure monitoring, cardiac monitor, continuous pulse oximetry, continuous capnometry, frequent LOC assessments and frequent vital sign checks   Intra-procedure events: none     Total Provider sedation time (minutes):  35 Post-procedure details:    Post-sedation assessment completed:  01/02/2023 10:27 AM   Attendance: Constant attendance by certified staff until patient recovered     Recovery: Patient returned to pre-procedure baseline     Patient is stable for discharge or admission: yes     Procedure completion:  Tolerated well, no immediate complications  Reduction of dislocation Date/Time: 10:10  AM Performed by: Minna Antis Authorized by: Minna Antis Consent: Verbal consent obtained. Risks and benefits: risks, benefits and alternatives were discussed Consent given by: patient and daughter Required items: required blood products, implants, devices, and special equipment available Time out: Immediately prior to procedure a "time out" was called to verify the correct patient, procedure, equipment, support staff and site/side marked as required.  Patient sedated: Yes, propofol Vitals: Vital signs were monitored during sedation. Patient tolerance: Patient tolerated the procedure well with no immediate complications. Joint: Right hip Reduction technique: Darrel Reach, and traction countertraction    FINAL CLINICAL IMPRESSION(S) / ED DIAGNOSES   Right hip pain Right hip dislocation   Note:  This document was prepared using Dragon voice recognition software and may include unintentional dictation errors.   Everleigh Colclasure,  Caryn Bee, MD 01/02/23 1327

## 2023-01-02 NOTE — Sedation Documentation (Signed)
Patient denies pain and is resting comfortably.  

## 2023-01-02 NOTE — ED Notes (Signed)
Attempted to call [facility] to give report. HIPAA compliant voicemail left

## 2023-01-17 ENCOUNTER — Ambulatory Visit
Admission: EM | Admit: 2023-01-17 | Discharge: 2023-01-17 | Disposition: A | Discharge status: Skilled Nursing Facility | Payer: 59 | Attending: Emergency Medicine | Admitting: Emergency Medicine

## 2023-01-17 ENCOUNTER — Emergency Department: Payer: 59

## 2023-01-17 ENCOUNTER — Emergency Department: Payer: 59 | Admitting: Certified Registered"

## 2023-01-17 ENCOUNTER — Encounter: Payer: Self-pay | Admitting: Anesthesiology

## 2023-01-17 ENCOUNTER — Encounter
Admission: EM | Disposition: A | Discharge status: Skilled Nursing Facility | Payer: Self-pay | Source: Home / Self Care | Attending: Emergency Medicine

## 2023-01-17 DIAGNOSIS — S73004A Unspecified dislocation of right hip, initial encounter: Secondary | ICD-10-CM

## 2023-01-17 DIAGNOSIS — G709 Myoneural disorder, unspecified: Secondary | ICD-10-CM | POA: Insufficient documentation

## 2023-01-17 DIAGNOSIS — Y792 Prosthetic and other implants, materials and accessory orthopedic devices associated with adverse incidents: Secondary | ICD-10-CM | POA: Diagnosis not present

## 2023-01-17 DIAGNOSIS — Z8711 Personal history of peptic ulcer disease: Secondary | ICD-10-CM | POA: Insufficient documentation

## 2023-01-17 DIAGNOSIS — Y9301 Activity, walking, marching and hiking: Secondary | ICD-10-CM | POA: Diagnosis not present

## 2023-01-17 DIAGNOSIS — I10 Essential (primary) hypertension: Secondary | ICD-10-CM | POA: Insufficient documentation

## 2023-01-17 DIAGNOSIS — T84020A Dislocation of internal right hip prosthesis, initial encounter: Secondary | ICD-10-CM | POA: Diagnosis present

## 2023-01-17 DIAGNOSIS — W01190A Fall on same level from slipping, tripping and stumbling with subsequent striking against furniture, initial encounter: Secondary | ICD-10-CM | POA: Insufficient documentation

## 2023-01-17 HISTORY — PX: HIP CLOSED REDUCTION: SHX983

## 2023-01-17 SURGERY — CLOSED REDUCTION, HIP
Anesthesia: General | Site: Hip | Laterality: Right

## 2023-01-17 MED ORDER — LACTATED RINGERS IV SOLN: INTRAVENOUS | Status: DC | PRN

## 2023-01-17 MED ORDER — SUCCINYLCHOLINE CHLORIDE 200 MG/10ML IV SOSY
PREFILLED_SYRINGE | INTRAVENOUS | Status: DC | PRN
  Administered 2023-01-17: 100 mg via INTRAVENOUS

## 2023-01-17 MED ORDER — PROPOFOL 10 MG/ML IV BOLUS
INTRAVENOUS | Status: DC | PRN
  Administered 2023-01-17 (×5): 20 mg via INTRAVENOUS
  Administered 2023-01-17: 60 mg via INTRAVENOUS

## 2023-01-17 MED ORDER — PROPOFOL 10 MG/ML IV BOLUS: 30.0000 mg | Freq: Once | INTRAVENOUS | Status: DC

## 2023-01-17 MED ORDER — FENTANYL CITRATE PF 50 MCG/ML IJ SOSY
25.0000 ug | PREFILLED_SYRINGE | Freq: Once | INTRAMUSCULAR | Status: AC
  Administered 2023-01-17: 25 ug via INTRAVENOUS
  Filled 2023-01-17: qty 1

## 2023-01-17 MED ORDER — PROPOFOL 10 MG/ML IV BOLUS
INTRAVENOUS | Status: AC
  Filled 2023-01-17: qty 20

## 2023-01-17 MED ORDER — PROPOFOL 10 MG/ML IV BOLUS
INTRAVENOUS | Status: AC | PRN
  Administered 2023-01-17: 27.5 mg via INTRAVENOUS

## 2023-01-17 MED ORDER — LACTATED RINGERS IV BOLUS
1000.0000 mL | Freq: Once | INTRAVENOUS | Status: AC
  Administered 2023-01-17: 1000 mL via INTRAVENOUS

## 2023-01-17 MED ORDER — PROPOFOL 10 MG/ML IV BOLUS
30.0000 mg | Freq: Once | INTRAVENOUS | Status: DC
  Filled 2023-01-17: qty 20

## 2023-01-17 MED ORDER — PROPOFOL 10 MG/ML IV BOLUS
30.0000 mg | Freq: Once | INTRAVENOUS | Status: AC
  Administered 2023-01-17: 30 mg via INTRAVENOUS
  Filled 2023-01-17: qty 20

## 2023-01-17 SURGICAL SUPPLY — 15 items
BRACE KNEE POST OP SHORT (BRACE) IMPLANT
GAUZE 4X4 16PLY ~~LOC~~+RFID DBL (SPONGE) ×1 IMPLANT
GLOVE BIO SURGEON STRL SZ8 (GLOVE) ×2 IMPLANT
GOWN STRL REUS W/ TWL LRG LVL3 (GOWN DISPOSABLE) ×1 IMPLANT
GOWN STRL REUS W/ TWL XL LVL3 (GOWN DISPOSABLE) ×1 IMPLANT
GOWN STRL REUS W/TWL LRG LVL3 (GOWN DISPOSABLE)
GOWN STRL REUS W/TWL XL LVL3 (GOWN DISPOSABLE)
HOLSTER ELECTROSUGICAL PENCIL (MISCELLANEOUS) ×1 IMPLANT
KIT TURNOVER KIT A (KITS) ×1 IMPLANT
MANIFOLD NEPTUNE II (INSTRUMENTS) ×1 IMPLANT
PACK HIP PROSTHESIS (MISCELLANEOUS) ×1 IMPLANT
PILLOW ABDUCTION FOAM SM (MISCELLANEOUS) IMPLANT
SPONGE T-LAP 18X18 ~~LOC~~+RFID (SPONGE) ×4 IMPLANT
TRAP FLUID SMOKE EVACUATOR (MISCELLANEOUS) ×1 IMPLANT
WATER STERILE IRR 500ML POUR (IV SOLUTION) ×1 IMPLANT

## 2023-01-17 NOTE — ED Notes (Signed)
Pt to Xray.

## 2023-01-17 NOTE — Op Note (Signed)
01/17/2023  10:09 PM  Patient:   Marilyn Romero  Pre-Op Diagnosis:   Closed posterior right prothesic hip dislocation.  Post-Op Diagnosis:   Same.  Procedure:   Closed reduction of right prosthetic hip dislocation.  Surgeon:   Maryagnes Amos, MD  Assistant:   None  Anesthesia:   IV sedation  Findings:   As above.  Complications:   None  EBL:   None  Fluids:   100 cc crystalloid  TT:   None  Drains:   None  Closure:   None  Implants:   None  Brief Clinical Note:   The patient is a 89 year old female who is now 7.5 years status post a right total hip arthroplasty. Over the past few years, she has had numerous prosthetic hip dislocations, including several over the past 3 months. The patient was in her usual state of health this evening when she apparently tripped and fell, dislocating her hip. She was brought to the emergency room where an attempted reduction was unsuccessful under sedation by the ER provider. Therefore, the patient is brought to the operating room at this time for closed reduction under general anesthesia of this dislocation.  Procedure:   The patient was brought into the operating room and lain in the supine position.  After adequate IV sedation was achieved, a timeout was performed to verify the appropriate surgical site.  Several attempts at a reduction maneuver was performed using longitudinal traction with internal rotation and flexion while countertraction was applied to the pelvis.  Unfortunately, the hip was not able to be reduced.  Therefore, fluoroscopy was used to help guide the head into the cup while applying traction in conjunction with internal rotation and flexion.  The hip then was felt to clunk back into place.  The leg lengths were now equal and leg rotation was symmetric to the contralateral side. The hip was stable to flexion to 90 with internal rotation to 10.  The adequacy of reduction was confirmed by fluoroscopic imaging before the  patient's right lower extremity was placed into a hinged knee brace with the hinges locked in extension.  The patient was then awakened and returned to the recovery room in satisfactory condition after tolerating the procedure well.

## 2023-01-17 NOTE — ED Triage Notes (Signed)
Pt BIBA from [facility] [address] Pt reports a fall and complaining of right hip pain. Pt has history of hip dislocation. Pt given of fentanyl en route. Pt recently diagnosed with a UTI and is scheduled to start Cipro tomorrow.

## 2023-01-17 NOTE — Transfer of Care (Signed)
Immediate Anesthesia Transfer of Care Note  Patient: Marilyn Romero  Procedure(s) Performed: CLOSED REDUCTION HIP (Right: Hip)  Patient Location: PACU  Anesthesia Type:General  Level of Consciousness: awake, drowsy, and patient cooperative  Airway & Oxygen Therapy: Patient Spontanous Breathing and Patient connected to face mask oxygen  Post-op Assessment: Report given to RN and Post -op Vital signs reviewed and stable  Post vital signs: Reviewed and stable  Last Vitals:  Vitals Value Taken Time  BP 137/108 01/17/23 2221  Temp 36.7 C 01/17/23 2205  Pulse 73 01/17/23 2232  Resp 26 01/17/23 2221  SpO2 98 % 01/17/23 2232  Vitals shown include unvalidated device data.  Last Pain:  Vitals:   01/17/23 2205  TempSrc:   PainSc: Asleep         Complications: No notable events documented.

## 2023-01-17 NOTE — ED Notes (Signed)
Pt to OR.

## 2023-01-17 NOTE — Anesthesia Procedure Notes (Signed)
Procedure Name: General with mask airway Date/Time: 01/17/2023 9:40 PM  Performed by: Elmarie Mainland, CRNAPre-anesthesia Checklist: Patient identified, Emergency Drugs available, Suction available and Patient being monitored Patient Re-evaluated:Patient Re-evaluated prior to induction Oxygen Delivery Method: Circle system utilized Preoxygenation: Pre-oxygenation with 100% oxygen Induction Type: IV induction Ventilation: Mask ventilation without difficulty

## 2023-01-17 NOTE — H&P (Signed)
Subjective:  Chief complaint: Right hip pain.  The patient is a 89 y.o. female with multiple medical problems who lives in an assisted living facility and is status post a right total hip arthroplasty performed 7.5 years ago.  The patient has had numerous prosthetic hip dislocations over the past few years, including one 2 weeks ago and another nearly 3 months ago.  In each case, the hip was able to be reduced in the emergency room.  Apparently the patient was in her usual state of health today when she lost her balance and fell, re-dislocating her right hip.  Despite several attempts, the ER providers were unable to relocate her hip, so orthopedic consultation was requested.  The patient denies any associated injury resulting from the fall.  She did not strike her head or lose consciousness.  The patient also denies any light-headedness, dizziness, chest pain, or shortness of breath which might have contributed to the injury.  Patient Active Problem List   Diagnosis Date Noted   Baker's cyst of knee, left 12/10/2021   SBO (small bowel obstruction) (HCC) 12/08/2021   UTI (urinary tract infection) 12/08/2021   Essential hypertension 12/08/2021   Dysphagia 12/08/2021   Chronic pain syndrome 01/10/2021   Personal history of colon cancer    Abdominal pain, epigastric    Acute peptic ulcer of stomach    Gastritis without bleeding    Insomnia 06/18/2016   Musculoskeletal pain 10/04/2015   Lumbar radiculopathy 10/04/2015   Status post total replacement of right hip 08/15/2015   Primary osteoarthritis of one hip 08/15/2015   Pain due to total hip replacement (HCC) 08/15/2015   DDD (degenerative disc disease), lumbar 12/16/2014   Facet syndrome, lumbar 12/16/2014   Sacroiliac joint dysfunction of both sides 12/16/2014   Cancer of sigmoid colon (HCC) 12/08/2014   Pure hypercholesterolemia 08/10/2013   Colonic mass 10/07/2012   GERD (gastroesophageal reflux disease) 05/26/2012   Lesion of bladder  04/29/2012   Atrophic vaginitis 04/29/2012   Dysuria 04/17/2012   Diverticulitis 06/27/2011   Past Medical History:  Diagnosis Date   Cancer of sigmoid colon (HCC) 12/08/2014   Colonic mass 10/07/2012   Overview:  Gets routine colonoscopies. Dx was 2013.   DDD (degenerative disc disease), lumbar 12/16/2014   Diverticulitis 06/27/2011   Facet syndrome, lumbar 12/16/2014   GERD (gastroesophageal reflux disease) 05/26/2012   Insomnia 06/18/2016   Last Assessment & Plan:  Relevant Hx: Course: Daily Update: Today's Plan:   Lesion of bladder 04/29/2012   Lumbar radiculopathy 10/04/2015   Sacroiliac joint dysfunction of both sides 12/16/2014   Status post total replacement of right hip 08/15/2015    Past Surgical History:  Procedure Laterality Date   COLON RESECTION     COLONOSCOPY WITH PROPOFOL N/A 07/02/2016   Procedure: COLONOSCOPY WITH PROPOFOL;  Surgeon: Midge Minium, MD;  Location: [facility];  Service: Endoscopy;  Laterality: N/A;   ESOPHAGOGASTRODUODENOSCOPY (EGD) WITH PROPOFOL N/A 07/02/2016   Procedure: ESOPHAGOGASTRODUODENOSCOPY (EGD) WITH PROPOFOL;  Surgeon: Midge Minium, MD;  Location: [facility];  Service: Endoscopy;  Laterality: N/A;   HIP SURGERY Right    LUNG SURGERY     non-malignant mass on lung    Medications Prior to Admission  Medication Sig Dispense Refill Last Dose   atorvastatin (LIPITOR) 20 MG tablet Take 20 mg by mouth daily.      ibuprofen (ADVIL,MOTRIN) 200 MG tablet Take 200 mg by mouth every 6 (six) hours as needed. Reported on 10/31/2015  lisinopril (ZESTRIL) 5 MG tablet Take 5 mg by mouth daily.      potassium chloride SA (KLOR-CON M) 20 MEQ tablet Take 1 tablet (20 mEq total) by mouth daily for 5 doses. 5 tablet 0    traMADol (ULTRAM) 50 MG tablet Take 1 tablet twice daily for 5 days then every 12 hours as needed for severe pain. 20 tablet 0    No Known Allergies  Social History   Tobacco Use   Smoking status: Never   Smokeless tobacco:  Never  Substance Use Topics   Alcohol use: No    Alcohol/week: 0.0 standard drinks of alcohol    Family History  Problem Relation Age of Onset   Early death Mother    Arthritis Father    Hypertension Father    Breast cancer Other      Review of Systems: As noted above. The patient denies any chest pain, shortness of breath, nausea, vomiting, diarrhea, constipation, belly pain, blood in his/her stool, or burning with urination.  Objective: Temp:  [98.2 F (36.8 C)] 98.2 F (36.8 C) (06/20 1806) Pulse Rate:  [70-76] 72 (06/20 2030) Resp:  [18-36] 31 (06/20 2030) BP: (151-183)/(69-88) 177/77 (06/20 2030) SpO2:  [100 %] 100 % (06/20 2030) Weight:  [55 kg] 55 kg (06/20 1807)  Physical Exam: General:  Alert, no acute distress Psychiatric:  Patient is competent for consent with normal mood and affect  Cardiovascular:  RRR  Respiratory:  Clear to auscultation. No wheezing. Non-labored breathing GI:  Abdomen is soft and non-tender Skin:  No lesions in the area of chief complaint Neurologic:  Sensation intact distally Lymphatic:  No axillary or cervical lymphadenopathy  Orthopedic Exam:  Orthopedic examination is limited to the right hip and lower extremity.  The right lower extremity is somewhat shortened and internally rotated as compared to the left.  Skin inspection around the right hip is notable for a well-healed surgical incision, but otherwise is unremarkable.  No swelling, erythema, ecchymosis, abrasions, or other skin abnormalities are identified.  Imaging Review: Recent x-rays of the pelvis and right hip are available for review.  These films demonstrate a posterior superior dislocation of the right total hip arthroplasty.  The femoral and acetabular components appear to be in satisfactory position and without evidence of loosening.  No fractures or other acute bony abnormalities are identified.  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  Plan: The treatment options, including both surgical and nonsurgical choices, have been discussed in detail with the patient and her daughter.  They both would like to proceed with surgical intervention to include a closed reduction of her prosthetic right hip dislocation.  The risks (including bleeding, infection, nerve and/or blood vessel injury, persistent or recurrent pain, loosening or failure of the components, recurrent dislocation, need for further surgery, blood clots, strokes, heart attacks or arrhythmias, pneumonia, etc.) and benefits of the surgical procedure were discussed.  The patient and her daughter state their understanding and agree to proceed.  A formal written consent will be obtained by the nursing staff.

## 2023-01-17 NOTE — Anesthesia Preprocedure Evaluation (Signed)
Anesthesia Evaluation  Patient identified by MRN, date of birth, ID band Patient awake    Reviewed: Allergy & Precautions, H&P , NPO status , Patient's Chart, lab work & pertinent test results, reviewed documented beta blocker date and time   History of Anesthesia Complications Negative for: history of anesthetic complications  Airway Mallampati: II  TM Distance: >3 FB Neck ROM: full    Dental  (+) Dental Advidsory Given, Chipped, Poor Dentition   Pulmonary neg pulmonary ROS   Pulmonary exam normal breath sounds clear to auscultation       Cardiovascular Exercise Tolerance: Good hypertension, (-) angina (-) Past MI and (-) Cardiac Stents Normal cardiovascular exam(-) dysrhythmias (-) Valvular Problems/Murmurs Rhythm:regular Rate:Normal     Neuro/Psych neg Seizures  Neuromuscular disease  negative psych ROS   GI/Hepatic Neg liver ROS, PUD,GERD  ,,  Endo/Other  negative endocrine ROS    Renal/GU negative Renal ROS  negative genitourinary   Musculoskeletal   Abdominal   Peds  Hematology negative hematology ROS (+)   Anesthesia Other Findings Past Medical History: 12/08/2014: Cancer of sigmoid colon (HCC) 10/07/2012: Colonic mass     Comment:  Overview:  Gets routine colonoscopies. Dx was 2013. 12/16/2014: DDD (degenerative disc disease), lumbar 06/27/2011: Diverticulitis 12/16/2014: Facet syndrome, lumbar 05/26/2012: GERD (gastroesophageal reflux disease) 06/18/2016: Insomnia     Comment:  Last Assessment & Plan:  Relevant Hx: Course: Daily               Update: Today's Plan: 04/29/2012: Lesion of bladder 10/04/2015: Lumbar radiculopathy 12/16/2014: Sacroiliac joint dysfunction of both sides 08/15/2015: Status post total replacement of right hip   Reproductive/Obstetrics negative OB ROS                             Anesthesia Physical Anesthesia Plan  ASA: 2  Anesthesia Plan: General    Post-op Pain Management:    Induction: Intravenous  PONV Risk Score and Plan: 3 and Propofol infusion and TIVA  Airway Management Planned: Natural Airway and Mask  Additional Equipment:   Intra-op Plan:   Post-operative Plan:   Informed Consent: I have reviewed the patients History and Physical, chart, labs and discussed the procedure including the risks, benefits and alternatives for the proposed anesthesia with the patient or authorized representative who has indicated his/her understanding and acceptance.     Dental Advisory Given  Plan Discussed with: Anesthesiologist, CRNA and Surgeon  Anesthesia Plan Comments:         Anesthesia Quick Evaluation

## 2023-01-17 NOTE — Discharge Instructions (Addendum)
Orthopedic discharge instructions: May sponge bathe for hygienic purposes. Apply ice frequently to hip. Take ibuprofen and/or ES Tylenol when needed.  May weight-bear as tolerated in hinged knee brace locked in extension - use crutches or walker as needed. Follow-up in 2 to 3 weeks or as scheduled. AMBULATORY SURGERY  DISCHARGE INSTRUCTIONS   The drugs that you were given will stay in your system until tomorrow so for the next 24 hours you should not:  Drive an automobile Make any legal decisions Drink any alcoholic beverage   You may resume regular meals tomorrow.  Today it is better to start with liquids and gradually work up to solid foods.  You may eat anything you prefer, but it is better to start with liquids, then soup and crackers, and gradually work up to solid foods.   Please notify your doctor immediately if you have any unusual bleeding, trouble breathing, redness and pain at the surgery site, drainage, fever, or pain not relieved by medication.    Additional Instructions:     Please contact your physician with any problems or Same Day Surgery at [phone], Monday through Friday 6 am to 4 pm, or [place] [facility] number at [phone].

## 2023-01-17 NOTE — ED Provider Notes (Signed)
[facility] Provider Note    Event Date/Time   First MD Initiated Contact with Patient 01/17/23 1805     (approximate)   History   Fall and Hip Pain   HPI  Marilyn Romero is a 89 y.o. female past medical history of right hip replacement with history of prior dislocation who presents because of a fall.  Was walking when her leg got caught on a piece of furniture and then she fell onto the right hip.  Did not hit her head.  She endorses severe right hip pain.  Denies numbness tingling weakness.  Denies head pain or neck pain no upper extremity numbness or weakness.  Patient was last seen in the ED on 6/5 for dislocation and underwent a propofol sedation.  She followed up with Ortho on 6/12 and they recommended she wear the knee immobilizer for 6 weeks.  They did discuss possibility of revision but patient and daughter wanted to avoid any further surgical intervention.     Past Medical History:  Diagnosis Date   Cancer of sigmoid colon (HCC) 12/08/2014   Colonic mass 10/07/2012   Overview:  Gets routine colonoscopies. Dx was 2013.   DDD (degenerative disc disease), lumbar 12/16/2014   Diverticulitis 06/27/2011   Facet syndrome, lumbar 12/16/2014   GERD (gastroesophageal reflux disease) 05/26/2012   Insomnia 06/18/2016   Last Assessment & Plan:  Relevant Hx: Course: Daily Update: Today's Plan:   Lesion of bladder 04/29/2012   Lumbar radiculopathy 10/04/2015   Sacroiliac joint dysfunction of both sides 12/16/2014   Status post total replacement of right hip 08/15/2015    Patient Active Problem List   Diagnosis Date Noted   Baker's cyst of knee, left 12/10/2021   SBO (small bowel obstruction) (HCC) 12/08/2021   UTI (urinary tract infection) 12/08/2021   Essential hypertension 12/08/2021   Dysphagia 12/08/2021   Chronic pain syndrome 01/10/2021   Personal history of colon cancer    Abdominal pain, epigastric    Acute peptic ulcer of stomach    Gastritis  without bleeding    Insomnia 06/18/2016   Musculoskeletal pain 10/04/2015   Lumbar radiculopathy 10/04/2015   Status post total replacement of right hip 08/15/2015   Primary osteoarthritis of one hip 08/15/2015   Pain due to total hip replacement (HCC) 08/15/2015   DDD (degenerative disc disease), lumbar 12/16/2014   Facet syndrome, lumbar 12/16/2014   Sacroiliac joint dysfunction of both sides 12/16/2014   Cancer of sigmoid colon (HCC) 12/08/2014   Pure hypercholesterolemia 08/10/2013   Colonic mass 10/07/2012   GERD (gastroesophageal reflux disease) 05/26/2012   Lesion of bladder 04/29/2012   Atrophic vaginitis 04/29/2012   Dysuria 04/17/2012   Diverticulitis 06/27/2011     Physical Exam  Triage Vital Signs: ED Triage Vitals  Enc Vitals Group     BP 01/17/23 1806 (!) 173/74     Pulse Rate 01/17/23 1806 73     Resp 01/17/23 1806 18     Temp 01/17/23 1806 98.2 F (36.8 C)     Temp Source 01/17/23 1806 Oral     SpO2 01/17/23 1800 100 %     Weight 01/17/23 1807 121 lb 4.1 oz (55 kg)     Height 01/17/23 1807 5\' 7"  (1.702 m)     Head Circumference --      Peak Flow --      Pain Score 01/17/23 1807 8     Pain Loc --      Pain  Edu? --      Excl. in GC? --     Most recent vital signs: Vitals:   01/17/23 2231 01/17/23 2232  BP:    Pulse: 73 73  Resp:    Temp:    SpO2: 100% 98%     General: Awake, frail-appearing, uncomfortable CV:  Good peripheral perfusion.  Resp:  Normal effort.  Abd:  No distention.  Neuro:             Awake, Alert, Oriented x 3  Other:  Right hip is shortened and internally rotated Plus DP pulse Able to plantarflex and dorsiflex the ankle No chest wall tenderness  C-spine tenderness trauma to head or neck or face   ED Results / Procedures / Treatments  Labs (all labs ordered are listed, but only abnormal results are displayed) Labs Reviewed - No data to display   EKG    RADIOLOGY I reviewed and interpreted patient's right hip  x-ray which shows a posterior dislocation   PROCEDURES:  Critical Care performed: No  .Sedation  Date/Time: 01/19/2023 4:39 PM  Performed by: Georga Hacking, MD Authorized by: Georga Hacking, MD   Consent:    Consent obtained:  Verbal   Consent given by:  Guardian   Risks discussed:  Dysrhythmia, inadequate sedation, respiratory compromise necessitating ventilatory assistance and intubation, prolonged sedation necessitating reversal and prolonged hypoxia resulting in organ damage   Alternatives discussed:  Analgesia without sedation Universal protocol:    Procedure explained and questions answered to patient or proxy's satisfaction: yes     Relevant documents present and verified: yes     Test results available: yes     Imaging studies available: yes     Required blood products, implants, devices, and special equipment available: yes     Site/side marked: yes     Immediately prior to procedure, a time out was called: yes     Patient identity confirmed:  Arm band Pre-sedation assessment:    Time since last food or drink:  > 6 hrs   ASA classification: class 2 - patient with mild systemic disease     Mouth opening:  3 or more finger widths   Mallampati score:  I - soft palate, uvula, fauces, pillars visible   Neck mobility: normal     Pre-sedation assessments completed and reviewed: airway patency, cardiovascular function, hydration status, mental status, nausea/vomiting, pain level, respiratory function and temperature   Immediate pre-procedure details:    Reassessment: Patient reassessed immediately prior to procedure     Reviewed: vital signs     Verified: bag valve mask available, emergency equipment available, intubation equipment available, IV patency confirmed, oxygen available and suction available   Procedure details (see MAR for exact dosages):    Preoxygenation:  Nonrebreather mask   Sedation:  Propofol   Analgesia:  None   Intra-procedure monitoring:  Blood  pressure monitoring, cardiac monitor, continuous pulse oximetry, continuous capnometry, frequent LOC assessments and frequent vital sign checks   Intra-procedure events: none     Intra-procedure management:  Airway repositioning   Total Provider sedation time (minutes):  12 Post-procedure details:    Attendance: Constant attendance by certified staff until patient recovered     Recovery: Patient returned to pre-procedure baseline     Post-sedation assessments completed and reviewed: airway patency, cardiovascular function, hydration status, mental status, nausea/vomiting, pain level and respiratory function     Patient is stable for discharge or admission: yes     Procedure  completion:  Tolerated well, no immediate complications .Ortho Injury Treatment  Date/Time: 01/19/2023 4:42 PM  Performed by: Georga Hacking, MD Authorized by: Georga Hacking, MD   Consent:    Consent obtained:  Verbal   Risks discussed:  Recurrent dislocation, irreducible dislocation, fracture, nerve damage and restricted joint movement   Alternatives discussed:  No treatment and alternative treatmentInjury location: hip Location details: right hip Injury type: dislocation Pre-procedure neurovascular assessment: neurovascularly intact Pre-procedure distal perfusion: normal Pre-procedure neurological function: normal Pre-procedure range of motion: reduced  Anesthesia: Local anesthesia used: no  Patient sedated: Yes. Refer to sedation procedure documentation for details of sedation. Manipulation performed: yes Reduction method: external rotation and Allis maneuver Reduction successful: no Post-procedure neurovascular assessment: post-procedure neurovascularly intact Post-procedure distal perfusion: normal Post-procedure neurological function: normal Post-procedure range of motion: unchanged     The patient is on the cardiac monitor to evaluate for evidence of arrhythmia and/or significant heart rate  changes.   MEDICATIONS ORDERED IN ED: Medications  propofol (DIPRIVAN) 10 mg/mL bolus/IV push 30 mg (30 mg Intravenous Given 01/17/23 1949)  fentaNYL (SUBLIMAZE) injection 25 mcg (25 mcg Intravenous Given 01/17/23 1854)  lactated ringers bolus 1,000 mL (0 mLs Intravenous Stopped 01/17/23 2023)  propofol (DIPRIVAN) 10 mg/mL bolus/IV push (27.5 mg Intravenous Given 01/17/23 1951)     IMPRESSION / MDM / ASSESSMENT AND PLAN / ED COURSE  I reviewed the triage vital signs and the nursing notes.                              Patient's presentation is most consistent with acute complicated illness / injury requiring diagnostic workup.  Differential diagnosis includes, but is not limited to, hip dislocation, periprosthetic fracture, femur fracture  89 year old female with prior right hemiarthroplasty who presents with recurrent hip dislocation.  She has had this multiple times in the past.  She had a ground-level fall today mechanical in nature the right leg caught on a piece of furniture when she fell to the ground hitting the hip.  Did not hit her head or lose consciousness.  Only complaint is significant right hip pain and she has a shortened and internally rotated leg on exam.  She is however neurovascular intact.  X-ray confirms a posterior hip dislocation.  Of note the knee immobilizer that patient has been wearing is around her ankle currently. Did speak with both patient and patient's daughter over the phone who gave verbal consent for the procedure.  Risks and benefits discussed.  Patient also consenting and understands risk and benefits although she is perseverating about her pain which is why wanted to talk to her daughter.  Attempted reduction with the help of Dr. Erma Heritage.  Patient was deeply sedated with propofol but unfortunately were not able to get the hip back in place.  I did discuss with Dr. Reginia Naas who is coming to take the patient to the OR.        FINAL CLINICAL IMPRESSION(S) / ED  DIAGNOSES   Final diagnoses:  Dislocation of right hip, initial encounter [facility])     Rx / DC Orders   ED Discharge Orders     None        Note:  This document was prepared using Dragon voice recognition software and may include unintentional dictation errors.   Georga Hacking, MD 01/18/23 0011    Georga Hacking, MD 01/19/23 [phone]

## 2023-01-18 ENCOUNTER — Encounter: Payer: Self-pay | Admitting: Surgery

## 2023-01-21 NOTE — Anesthesia Postprocedure Evaluation (Signed)
Anesthesia Post Note  Patient: Marilyn Romero  Procedure(s) Performed: CLOSED REDUCTION HIP (Right: Hip)  Patient location during evaluation: PACU Anesthesia Type: General Level of consciousness: awake and alert Pain management: pain level controlled Vital Signs Assessment: post-procedure vital signs reviewed and stable Respiratory status: spontaneous breathing, nonlabored ventilation, respiratory function stable and patient connected to nasal cannula oxygen Cardiovascular status: blood pressure returned to baseline and stable Postop Assessment: no apparent nausea or vomiting Anesthetic complications: no   No notable events documented.   Last Vitals:  Vitals:   01/17/23 2231 01/17/23 2232  BP:    Pulse: 73 73  Resp:    Temp:    SpO2: 100% 98%    Last Pain:  Vitals:   01/17/23 2222  TempSrc:   PainSc: 0-No pain                 Lenard Simmer

## 2023-01-23 ENCOUNTER — Observation Stay
Admission: EM | Admit: 2023-01-23 | Discharge: 2023-01-30 | Disposition: A | Discharge status: Skilled Nursing Facility | Payer: 59 | Attending: Internal Medicine | Admitting: Internal Medicine

## 2023-01-23 ENCOUNTER — Emergency Department: Payer: 59

## 2023-01-23 ENCOUNTER — Other Ambulatory Visit: Payer: Self-pay

## 2023-01-23 DIAGNOSIS — R2689 Other abnormalities of gait and mobility: Secondary | ICD-10-CM | POA: Insufficient documentation

## 2023-01-23 DIAGNOSIS — Z79899 Other long term (current) drug therapy: Secondary | ICD-10-CM | POA: Diagnosis not present

## 2023-01-23 DIAGNOSIS — Y828 Other medical devices associated with adverse incidents: Secondary | ICD-10-CM | POA: Diagnosis not present

## 2023-01-23 DIAGNOSIS — S73004D Unspecified dislocation of right hip, subsequent encounter: Secondary | ICD-10-CM

## 2023-01-23 DIAGNOSIS — M25551 Pain in right hip: Secondary | ICD-10-CM

## 2023-01-23 DIAGNOSIS — E876 Hypokalemia: Secondary | ICD-10-CM | POA: Insufficient documentation

## 2023-01-23 DIAGNOSIS — I10 Essential (primary) hypertension: Secondary | ICD-10-CM | POA: Diagnosis not present

## 2023-01-23 DIAGNOSIS — M6281 Muscle weakness (generalized): Secondary | ICD-10-CM | POA: Diagnosis not present

## 2023-01-23 DIAGNOSIS — K219 Gastro-esophageal reflux disease without esophagitis: Secondary | ICD-10-CM | POA: Diagnosis present

## 2023-01-23 DIAGNOSIS — S73004A Unspecified dislocation of right hip, initial encounter: Secondary | ICD-10-CM

## 2023-01-23 DIAGNOSIS — T84020A Dislocation of internal right hip prosthesis, initial encounter: Principal | ICD-10-CM | POA: Insufficient documentation

## 2023-01-23 DIAGNOSIS — D649 Anemia, unspecified: Secondary | ICD-10-CM | POA: Insufficient documentation

## 2023-01-23 DIAGNOSIS — R2681 Unsteadiness on feet: Secondary | ICD-10-CM | POA: Insufficient documentation

## 2023-01-23 LAB — COMPREHENSIVE METABOLIC PANEL
ALT: 10 U/L (ref 0–44)
AST: 16 U/L (ref 15–41)
Albumin: 3.4 g/dL — ABNORMAL LOW (ref 3.5–5.0)
Alkaline Phosphatase: 64 U/L (ref 38–126)
Anion gap: 7 (ref 5–15)
BUN: 13 mg/dL (ref 8–23)
CO2: 24 mmol/L (ref 22–32)
Calcium: 9 mg/dL (ref 8.9–10.3)
Chloride: 108 mmol/L (ref 98–111)
Creatinine, Ser: 0.71 mg/dL (ref 0.44–1.00)
GFR, Estimated: >60 mL/min (ref >60)
Glucose, Bld: 116 mg/dL — ABNORMAL HIGH (ref 70–99)
Potassium: 3.5 mmol/L (ref 3.5–5.1)
Sodium: 139 mmol/L (ref 135–145)
Total Bilirubin: 0.6 mg/dL (ref 0.3–1.2)
Total Protein: 6.4 g/dL — ABNORMAL LOW (ref 6.5–8.1)

## 2023-01-23 LAB — CBC WITH DIFFERENTIAL/PLATELET
Abs Immature Granulocytes: 0.02 10*3/uL (ref 0.00–0.07)
Basophils Absolute: 0.1 10*3/uL (ref 0.0–0.1)
Basophils Relative: 1 %
Eosinophils Absolute: 0.3 10*3/uL (ref 0.0–0.5)
Eosinophils Relative: 4 %
HCT: 35.8 % — ABNORMAL LOW (ref 36.0–46.0)
Hemoglobin: 11.5 g/dL — ABNORMAL LOW (ref 12.0–15.0)
Immature Granulocytes: 0 %
Lymphocytes Relative: 20 %
Lymphs Abs: 1.7 10*3/uL (ref 0.7–4.0)
MCH: 28.8 pg (ref 26.0–34.0)
MCHC: 32.1 g/dL (ref 30.0–36.0)
MCV: 89.5 fL (ref 80.0–100.0)
Monocytes Absolute: 1 10*3/uL (ref 0.1–1.0)
Monocytes Relative: 11 %
Neutro Abs: 5.5 10*3/uL (ref 1.7–7.7)
Neutrophils Relative %: 64 %
Platelets: 371 10*3/uL (ref 150–400)
RBC: 4 MIL/uL (ref 3.87–5.11)
RDW: 14.6 % (ref 11.5–15.5)
WBC: 8.6 10*3/uL (ref 4.0–10.5)
nRBC: 0 % (ref 0.0–0.2)

## 2023-01-23 MED ORDER — HYDROMORPHONE HCL 1 MG/ML IJ SOLN
0.5000 mg | Freq: Once | INTRAMUSCULAR | Status: AC
  Administered 2023-01-23: 0.5 mg via INTRAVENOUS
  Filled 2023-01-23: qty 0.5

## 2023-01-23 MED ORDER — ENOXAPARIN SODIUM 40 MG/0.4ML IJ SOSY: 40.0000 mg | PREFILLED_SYRINGE | INTRAMUSCULAR | Status: DC

## 2023-01-23 MED ORDER — SENNA 8.6 MG PO TABS
1.0000 | ORAL_TABLET | Freq: Two times a day (BID) | ORAL | Status: DC
  Administered 2023-01-23 – 2023-01-30 (×13): 8.6 mg via ORAL
  Filled 2023-01-23 (×13): qty 1

## 2023-01-23 MED ORDER — ALBUTEROL SULFATE (2.5 MG/3ML) 0.083% IN NEBU
2.5000 mg | INHALATION_SOLUTION | Freq: Four times a day (QID) | RESPIRATORY_TRACT | Status: DC
  Administered 2023-01-23 – 2023-01-24 (×4): 2.5 mg via RESPIRATORY_TRACT
  Filled 2023-01-23 (×4): qty 3

## 2023-01-23 MED ORDER — ACETAMINOPHEN 650 MG RE SUPP: 650.0000 mg | Freq: Four times a day (QID) | RECTAL | Status: DC | PRN

## 2023-01-23 MED ORDER — OXYCODONE HCL 5 MG PO TABS
5.0000 mg | ORAL_TABLET | ORAL | Status: DC | PRN
  Administered 2023-01-24 (×2): 5 mg via ORAL
  Filled 2023-01-23 (×2): qty 1

## 2023-01-23 MED ORDER — ATORVASTATIN CALCIUM 20 MG PO TABS
20.0000 mg | ORAL_TABLET | Freq: Every day | ORAL | Status: DC
  Administered 2023-01-24 – 2023-01-29 (×6): 20 mg via ORAL
  Filled 2023-01-23 (×6): qty 1

## 2023-01-23 MED ORDER — POLYETHYLENE GLYCOL 3350 17 G PO PACK: 17.0000 g | PACK | Freq: Every day | ORAL | Status: DC | PRN

## 2023-01-23 MED ORDER — OXYCODONE-ACETAMINOPHEN 5-325 MG PO TABS
1.0000 | ORAL_TABLET | Freq: Once | ORAL | Status: AC
  Administered 2023-01-23: 1 via ORAL
  Filled 2023-01-23: qty 1

## 2023-01-23 MED ORDER — HYDRALAZINE HCL 20 MG/ML IJ SOLN
10.0000 mg | Freq: Four times a day (QID) | INTRAMUSCULAR | Status: DC | PRN
  Administered 2023-01-28: 10 mg via INTRAVENOUS
  Filled 2023-01-23: qty 1

## 2023-01-23 MED ORDER — LISINOPRIL 5 MG PO TABS
5.0000 mg | ORAL_TABLET | Freq: Every day | ORAL | Status: DC
  Administered 2023-01-24 – 2023-01-30 (×7): 5 mg via ORAL
  Filled 2023-01-23 (×7): qty 1

## 2023-01-23 MED ORDER — ACETAMINOPHEN 325 MG PO TABS: 650.0000 mg | ORAL_TABLET | Freq: Four times a day (QID) | ORAL | Status: DC | PRN

## 2023-01-23 NOTE — ED Notes (Signed)
Pt's brief wet- changed and cleaned- pt tolerated well.

## 2023-01-23 NOTE — ED Provider Notes (Signed)
[facility] Provider Note    Event Date/Time   First MD Initiated Contact with Patient 01/23/23 2013     (approximate)   History   Hip Pain   HPI  Marilyn Romero is a 89 y.o. female past medical history of total hip arthroplasty with recurrent dislocations who presents with hip pain.  Patient was recently admitted to [facility] for dislocation that required the OR as it was difficult to reduce under propofol sedation in the ER.  She has been wearing hinged knee brace.  Today she was walking around apparently is not supposed to be ambulating started complaining of right hip pain.  Denies any fall.  Patient complains of pain that is "worse than ever".     Past Medical History:  Diagnosis Date   Cancer of sigmoid colon (HCC) 12/08/2014   Colonic mass 10/07/2012   Overview:  Gets routine colonoscopies. Dx was 2013.   DDD (degenerative disc disease), lumbar 12/16/2014   Diverticulitis 06/27/2011   Facet syndrome, lumbar 12/16/2014   GERD (gastroesophageal reflux disease) 05/26/2012   Insomnia 06/18/2016   Last Assessment & Plan:  Relevant Hx: Course: Daily Update: Today's Plan:   Lesion of bladder 04/29/2012   Lumbar radiculopathy 10/04/2015   Sacroiliac joint dysfunction of both sides 12/16/2014   Status post total replacement of right hip 08/15/2015    Patient Active Problem List   Diagnosis Date Noted   Hip dislocation, right (HCC) 01/23/2023   Baker's cyst of knee, left 12/10/2021   SBO (small bowel obstruction) (HCC) 12/08/2021   UTI (urinary tract infection) 12/08/2021   Essential hypertension 12/08/2021   Dysphagia 12/08/2021   Chronic pain syndrome 01/10/2021   Personal history of colon cancer    Abdominal pain, epigastric    Acute peptic ulcer of stomach    Gastritis without bleeding    Insomnia 06/18/2016   Musculoskeletal pain 10/04/2015   Lumbar radiculopathy 10/04/2015   Status post total replacement of right hip 08/15/2015   Primary  osteoarthritis of one hip 08/15/2015   Pain due to total hip replacement (HCC) 08/15/2015   DDD (degenerative disc disease), lumbar 12/16/2014   Facet syndrome, lumbar 12/16/2014   Sacroiliac joint dysfunction of both sides 12/16/2014   Cancer of sigmoid colon (HCC) 12/08/2014   Pure hypercholesterolemia 08/10/2013   Colonic mass 10/07/2012   GERD (gastroesophageal reflux disease) 05/26/2012   Lesion of bladder 04/29/2012   Atrophic vaginitis 04/29/2012   Dysuria 04/17/2012   Diverticulitis 06/27/2011     Physical Exam  Triage Vital Signs: ED Triage Vitals  Enc Vitals Group     BP 01/23/23 2006 (!) 152/86     Pulse Rate 01/23/23 2006 88     Resp 01/23/23 2006 (!) 21     Temp 01/23/23 2006 98.2 F (36.8 C)     Temp Source 01/23/23 2006 Oral     SpO2 01/23/23 2006 99 %     Weight 01/23/23 2011 126 lb (57.2 kg)     Height 01/23/23 2011 5\' 7"  (1.702 m)     Head Circumference --      Peak Flow --      Pain Score 01/23/23 2010 8     Pain Loc --      Pain Edu? --      Excl. in GC? --     Most recent vital signs: Vitals:   01/23/23 2130 01/23/23 2200  BP: (!) 157/76 (!) 155/98  Pulse: 84 83  Resp:  Temp:    SpO2: 99% 100%     General: Awake, no distress.  CV:  Good peripheral perfusion.  Resp:  Normal effort.  Abd:  No distention.  Neuro:             Awake, Alert, Oriented x 3  Other:  No significant shortening of the leg, 2+ DP pulses, tender to palpation over the greater trochanter, decreased range of motion   ED Results / Procedures / Treatments  Labs (all labs ordered are listed, but only abnormal results are displayed) Labs Reviewed  COMPREHENSIVE METABOLIC PANEL - Abnormal; Notable for the following components:      Result Value   Glucose, Bld 116 (*)    Total Protein 6.4 (*)    Albumin 3.4 (*)    All other components within normal limits  CBC WITH DIFFERENTIAL/PLATELET - Abnormal; Notable for the following components:   Hemoglobin 11.5 (*)    HCT  35.8 (*)    All other components within normal limits  BASIC METABOLIC PANEL  CBC     EKG     RADIOLOGY    PROCEDURES:  Critical Care performed: No  Procedures    MEDICATIONS ORDERED IN ED: Medications  acetaminophen (TYLENOL) tablet 650 mg (has no administration in time range)    Or  acetaminophen (TYLENOL) suppository 650 mg (has no administration in time range)  albuterol (PROVENTIL) (2.5 MG/3ML) 0.083% nebulizer solution 2.5 mg (2.5 mg Nebulization Given 01/23/23 2218)  hydrALAZINE (APRESOLINE) injection 10 mg (has no administration in time range)  enoxaparin (LOVENOX) injection 40 mg (has no administration in time range)  oxyCODONE (Oxy IR/ROXICODONE) immediate release tablet 5 mg (has no administration in time range)  senna (SENOKOT) tablet 8.6 mg (8.6 mg Oral Given 01/23/23 2227)  polyethylene glycol (MIRALAX / GLYCOLAX) packet 17 g (has no administration in time range)  oxyCODONE-acetaminophen (PERCOCET/ROXICET) 5-325 MG per tablet 1 tablet (1 tablet Oral Given 01/23/23 2052)  HYDROmorphone (DILAUDID) injection 0.5 mg (0.5 mg Intravenous Given 01/23/23 2158)     IMPRESSION / MDM / ASSESSMENT AND PLAN / ED COURSE  I reviewed the triage vital signs and the nursing notes.                              Patient's presentation is most consistent with acute complicated illness / injury requiring diagnostic workup.  Differential diagnosis includes, but is not limited to, recurrent dislocation, osteoarthritis, musculoskeletal pain  The patient is an 89 year old female with recurrent right hip dislocations who presents with hip pain.  I actually saw the patient last time she was here after a ground-level fall resulting in a hip dislocation.  It was quite difficult and we are ultimately not able to get the hip back and placed under propofol sedation and she went to the OR with Dr. Joice Lofts and was also a difficult reduction in the OR.  Today patient was ambulating-Per EMS report  is not supposed to ambulating?  Did not have a fall but started complaining of hip pain.  Thus she presents to the ED.  On exam the leg actually does not appear significantly shortened but she is complaining of significant pain.  Neurovascular exam is intact.  Unfortunately x-ray demonstrates a posterior dislocation yet again.  Has similar morphology to the prior x-ray.  Discussed with Dr. Joice Lofts on-call who recommends admitting the patient to the hospital to have reduction in the OR tomorrow.  I have  discussed with the hospitalist to admit the patient.       FINAL CLINICAL IMPRESSION(S) / ED DIAGNOSES   Final diagnoses:  Dislocation of right hip, initial encounter [facility])     Rx / DC Orders   ED Discharge Orders     None        Note:  This document was prepared using Dragon voice recognition software and may include unintentional dictation errors.   Georga Hacking, MD 01/23/23 [phone]

## 2023-01-23 NOTE — ED Notes (Signed)
Patient is resting comfortably. 

## 2023-01-23 NOTE — ED Notes (Signed)
Pt changed

## 2023-01-23 NOTE — ED Triage Notes (Signed)
Patient dislocated her hip on on 6/20, and was seen at this hospital.  Patient was not supposed to be ambulatory, but staff at the facility told EMS that the patient was up walking around today.  She is complaining of right hip pain.  Staff and patient deny any falls today, but attribute pain to overuse today.

## 2023-01-23 NOTE — H&P (Signed)
[facility] History and Physical  Marilyn Romero WUJ:[id number] DOB: 07/16/1934 DOA: 01/23/2023 PCP: Leim Fabry, MD  Admitted from: Long-term nursing facility Chief Complaint: Right hip pain  History of Present Illness: Marilyn Romero is a 89 y.o. female with PMH significant for HTN, osteoarthritis, lumbar radiculopathy, GERD, impaired mobility, multiple falls and recurrent hip dislocations.  She is a long-term resident at [facility] nursing facility.   Over the past few years, patient has had numerous prosthetic hip dislocation including several over the past 3 months, most recent one was on 6/20 requiring closed reduction in OR.  6/5, patient was seen in ED after a mechanical fall onto the right hip.  Imaging showed dislocation of right hip.  She underwent closed reduction in the ED under propofol sedation. 6/12, she saw orthopedics as an outpatient.  They recommended she wear the knee immobilizer for 6 weeks 6/20, patient was brought back to ED after ground-level mechanical fall leading to significant right hip pain.  X-ray showed posterior hip dislocation.  EDP attempted closed reduction under propofol sedation but was unsuccessful. Orthopedics Dr. Joice Lofts took her to the OR and did a closed reduction of right prosthetic hip. Patient was apparently supposed to be nonambulatory until cleared by orthopedics.  However the staff at the facility noted that patient was walking around today.  She did not have a fall at started complaining of right hip pain after that.  EMS was called and patient was sent to the ED.  In the ED, patient was afebrile and hemodynamically stable Labs unremarkable X-ray pelvis showed superolateral dislocation of the right hip prosthesis femoral component.  EDP discussed with orthopedics on-call who recommended not to try reduction in the ED. Orthopedics plans for close reduction in OR tomorrow.   Hospitalist service consulted for overnight  observation.  At the time of my evaluation, patient was sleeping comfortably in bed.  Able to wake up on calling her name.  Not in pain.  Notes she is in the hospital.  No family at bedside.  Review of Systems:  All systems were reviewed and were negative unless otherwise mentioned in the HPI   Past medical history: Past Medical History:  Diagnosis Date   Cancer of sigmoid colon (HCC) 12/08/2014   Colonic mass 10/07/2012   Overview:  Gets routine colonoscopies. Dx was 2013.   DDD (degenerative disc disease), lumbar 12/16/2014   Diverticulitis 06/27/2011   Facet syndrome, lumbar 12/16/2014   GERD (gastroesophageal reflux disease) 05/26/2012   Insomnia 06/18/2016   Last Assessment & Plan:  Relevant Hx: Course: Daily Update: Today's Plan:   Lesion of bladder 04/29/2012   Lumbar radiculopathy 10/04/2015   Sacroiliac joint dysfunction of both sides 12/16/2014   Status post total replacement of right hip 08/15/2015    Past surgical history: Past Surgical History:  Procedure Laterality Date   COLON RESECTION     COLONOSCOPY WITH PROPOFOL N/A 07/02/2016   Procedure: COLONOSCOPY WITH PROPOFOL;  Surgeon: Midge Minium, MD;  Location: [facility];  Service: Endoscopy;  Laterality: N/A;   ESOPHAGOGASTRODUODENOSCOPY (EGD) WITH PROPOFOL N/A 07/02/2016   Procedure: ESOPHAGOGASTRODUODENOSCOPY (EGD) WITH PROPOFOL;  Surgeon: Midge Minium, MD;  Location: [facility];  Service: Endoscopy;  Laterality: N/A;   HIP CLOSED REDUCTION Right 01/17/2023   Procedure: CLOSED REDUCTION HIP;  Surgeon: Christena Flake, MD;  Location: [facility];  Service: Orthopedics;  Laterality: Right;   HIP SURGERY Right    LUNG SURGERY     non-malignant mass  on lung    Social History:  reports that she has never smoked. She has never used smokeless tobacco. She reports that she does not drink alcohol and does not use drugs.  Allergies:  No Known Allergies Patient has no known allergies.   Family history:  Family  History  Problem Relation Age of Onset   Early death Mother    Arthritis Father    Hypertension Father    Breast cancer Other      Home Meds: Prior to Admission medications   Medication Sig Start Date End Date Taking? Authorizing Provider  atorvastatin (LIPITOR) 20 MG tablet Take 20 mg by mouth daily.    [provider]  ibuprofen (ADVIL,MOTRIN) 200 MG tablet Take 200 mg by mouth every 6 (six) hours as needed. Reported on 10/31/2015    [provider]  lisinopril (ZESTRIL) 5 MG tablet Take 5 mg by mouth daily.    [provider]  potassium chloride SA (KLOR-CON M) 20 MEQ tablet Take 1 tablet (20 mEq total) by mouth daily for 5 doses. 10/30/22 11/04/22  Shaune Pollack, MD  traMADol (ULTRAM) 50 MG tablet Take 1 tablet twice daily for 5 days then every 12 hours as needed for severe pain. 10/30/22   Shaune Pollack, MD    Physical Exam: Vitals:   01/23/23 2010 01/23/23 2011 01/23/23 2130 01/23/23 2200  BP: (!) 152/86  (!) 157/76 (!) 155/98  Pulse: 88  84 83  Resp: (!) 21     Temp: 98.2 F (36.8 C)     TempSrc: Oral     SpO2: 99%  99% 100%  Weight:  57.2 kg    Height:  5\' 7"  (1.702 m)     Wt Readings from Last 3 Encounters:  01/23/23 57.2 kg  01/17/23 55 kg  01/02/23 53.1 kg   Body mass index is 19.73 kg/m.  General exam: Pleasant elderly Caucasian female.  Not in pain currently Skin: No rashes, lesions or ulcers. HEENT: Atraumatic, normocephalic, no obvious bleeding Lungs: Clear to auscultation bilaterally CVS: Regular rate and rhythm, mild systolic murmur GI/Abd soft, nontender, nondistended, bowels are present CNS: Alert, awake, oriented to place and person Psychiatry: Mood appropriate Extremities: No pedal edema, no calf tenderness.  Shortening and internal rotation of right lower extremity noted   ------------------------------------------------------------------------------------------------------ Assessment/Plan: Principal Problem:   Hip  dislocation, right (HCC) Active Problems:   GERD (gastroesophageal reflux disease)   Essential hypertension  Right prosthetic hip dislocation History of recurrent dislocations Sent from nursing facility with complaint of right hip pain.  Did not have a fall.  Apparently she was not supposed to be ambulatory until cleared by orthopedics but she got up by self causing recurrent hip dislocation. X-ray pelvis showed superolateral dislocation of the right hip prosthesis femoral component. Orthopedics to try reduction in OR tomorrow. As needed pain meds ordered. Regular diet for now.  N.p.o. after midnight. PT eval after the procedure  Hypertension Continue lisinopril  HLD Continue Lipitor  Goals of care   Code Status: Full Code    DVT prophylaxis:  enoxaparin (LOVENOX) injection 40 mg Start: 01/24/23 2000   Antimicrobials: None Fluid: None Consultants: Orthopedics Family Communication: None at bedside  Dispo: The patient is from:  Long-term nursing facility              Anticipated d/c is to:  Back to long-term nursing facility after or  Diet: Diet Order  Diet NPO time specified Except for: Sips with Meds  Diet effective midnight                    ------------------------------------------------------------------------------------- Severity of Illness: The appropriate patient status for this patient is OBSERVATION. Observation status is judged to be reasonable and necessary in order to provide the required intensity of service to ensure the patient's safety. The patient's presenting symptoms, physical exam findings, and initial radiographic and laboratory data in the context of their medical condition is felt to place them at decreased risk for further clinical deterioration. Furthermore, it is anticipated that the patient will be medically stable for discharge from the hospital within 2 midnights of admission.   Labs on Admission:   CBC: Recent Labs   Lab 01/23/23 2201  WBC 8.6  NEUTROABS 5.5  HGB 11.5*  HCT 35.8*  MCV 89.5  PLT 371    Basic Metabolic Panel: Recent Labs  Lab 01/23/23 2201  NA 139  K 3.5  CL 108  CO2 24  GLUCOSE 116*  BUN 13  CREATININE 0.71  CALCIUM 9.0    Liver Function Tests: Recent Labs  Lab 01/23/23 2201  AST 16  ALT 10  ALKPHOS 64  BILITOT 0.6  PROT 6.4*  ALBUMIN 3.4*   No results for input(s): "LIPASE", "AMYLASE" in the last 168 hours. No results for input(s): "AMMONIA" in the last 168 hours.  Cardiac Enzymes: No results for input(s): "CKTOTAL", "CKMB", "CKMBINDEX", "TROPONINI" in the last 168 hours.  BNP (last 3 results) No results for input(s): "BNP" in the last 8760 hours.  ProBNP (last 3 results) No results for input(s): "PROBNP" in the last 8760 hours.  CBG: No results for input(s): "GLUCAP" in the last 168 hours.  Lipase     Component Value Date/Time   LIPASE 114 (H) 12/08/2021 0536     Urinalysis    Component Value Date/Time   COLORURINE YELLOW (A) 12/08/2021 0722   APPEARANCEUR CLEAR (A) 12/08/2021 0722   APPEARANCEUR Cloudy 10/10/2011 0144   LABSPEC >1.046 (H) 12/08/2021 0722   LABSPEC 1.012 10/10/2011 0144   PHURINE 6.0 12/08/2021 0722   GLUCOSEU NEGATIVE 12/08/2021 0722   GLUCOSEU Negative 10/10/2011 0144   HGBUR MODERATE (A) 12/08/2021 0722   BILIRUBINUR NEGATIVE 12/08/2021 0722   BILIRUBINUR Negative 10/10/2011 0144   KETONESUR 5 (A) 12/08/2021 0722   PROTEINUR NEGATIVE 12/08/2021 0722   NITRITE NEGATIVE 12/08/2021 0722   LEUKOCYTESUR LARGE (A) 12/08/2021 0722   LEUKOCYTESUR 3+ 10/10/2011 0144     Drugs of Abuse  No results found for: "LABOPIA", "COCAINSCRNUR", "LABBENZ", "AMPHETMU", "THCU", "LABBARB"    Radiological Exams on Admission: DG Pelvis 1-2 Views  Result Date: 01/23/2023 CLINICAL DATA:  Right hip prosthesis with recurrent dislocation. EXAM: PELVIS - 1-2 VIEW COMPARISON:  Similar study 01/17/2023 and subsequent postreduction spot  fluoroscopic view. FINDINGS: Generalized osteopenia. Right hip arthroplasty is again noted with superolateral dislocation of the femoral component. No pelvic fracture or diastasis is seen AP. There is mild degenerative arthrosis of the SI joints, symphysis pubis and left hip. Tubular calcific plaques in the superficial femoral arteries are again shown. Degenerative change of the visualized lower lumbar spine. IMPRESSION: 1. Superolateral dislocation of the right hip prosthesis femoral component. 2. Osteopenia and degenerative change. 3. Peripheral vascular disease. Electronically Signed   By: Almira Bar M.D.   On: 01/23/2023 20:59     Signed, Lorin Glass, MD [facility] 01/23/2023

## 2023-01-24 ENCOUNTER — Observation Stay: Payer: 59

## 2023-01-24 ENCOUNTER — Observation Stay: Payer: 59 | Admitting: Anesthesiology

## 2023-01-24 ENCOUNTER — Encounter: Payer: Self-pay | Admitting: Internal Medicine

## 2023-01-24 ENCOUNTER — Encounter
Admission: EM | Disposition: A | Discharge status: Skilled Nursing Facility | Payer: Self-pay | Source: Home / Self Care | Attending: Emergency Medicine

## 2023-01-24 DIAGNOSIS — I10 Essential (primary) hypertension: Secondary | ICD-10-CM

## 2023-01-24 DIAGNOSIS — T84020A Dislocation of internal right hip prosthesis, initial encounter: Secondary | ICD-10-CM | POA: Diagnosis not present

## 2023-01-24 DIAGNOSIS — S73004A Unspecified dislocation of right hip, initial encounter: Secondary | ICD-10-CM | POA: Diagnosis not present

## 2023-01-24 HISTORY — PX: HIP CLOSED REDUCTION: SHX983

## 2023-01-24 LAB — BASIC METABOLIC PANEL
Anion gap: 6 (ref 5–15)
BUN: 12 mg/dL (ref 8–23)
CO2: 25 mmol/L (ref 22–32)
Calcium: 9 mg/dL (ref 8.9–10.3)
Chloride: 109 mmol/L (ref 98–111)
Creatinine, Ser: 0.69 mg/dL (ref 0.44–1.00)
GFR, Estimated: >60 mL/min (ref >60)
Glucose, Bld: 115 mg/dL — ABNORMAL HIGH (ref 70–99)
Potassium: 3.6 mmol/L (ref 3.5–5.1)
Sodium: 140 mmol/L (ref 135–145)

## 2023-01-24 LAB — CBC
HCT: 34.3 % — ABNORMAL LOW (ref 36.0–46.0)
Hemoglobin: 10.7 g/dL — ABNORMAL LOW (ref 12.0–15.0)
MCH: 28.5 pg (ref 26.0–34.0)
MCHC: 31.2 g/dL (ref 30.0–36.0)
MCV: 91.2 fL (ref 80.0–100.0)
Platelets: 327 10*3/uL (ref 150–400)
RBC: 3.76 MIL/uL — ABNORMAL LOW (ref 3.87–5.11)
RDW: 14.8 % (ref 11.5–15.5)
WBC: 7.7 10*3/uL (ref 4.0–10.5)
nRBC: 0 % (ref 0.0–0.2)

## 2023-01-24 SURGERY — CLOSED REDUCTION, HIP
Anesthesia: General | Site: Hip | Laterality: Right

## 2023-01-24 MED ORDER — KETOROLAC TROMETHAMINE 15 MG/ML IJ SOLN
15.0000 mg | Freq: Once | INTRAMUSCULAR | Status: AC
  Administered 2023-01-24: 15 mg via INTRAVENOUS

## 2023-01-24 MED ORDER — DIPHENHYDRAMINE HCL 12.5 MG/5ML PO ELIX: 12.5000 mg | ORAL_SOLUTION | ORAL | Status: DC | PRN

## 2023-01-24 MED ORDER — METOCLOPRAMIDE HCL 5 MG PO TABS: 5.0000 mg | ORAL_TABLET | Freq: Three times a day (TID) | ORAL | Status: DC | PRN

## 2023-01-24 MED ORDER — ACETAMINOPHEN 325 MG PO TABS
650.0000 mg | ORAL_TABLET | Freq: Once | ORAL | Status: AC
  Administered 2023-01-24: 650 mg via ORAL

## 2023-01-24 MED ORDER — PHENYLEPHRINE 80 MCG/ML (10ML) SYRINGE FOR IV PUSH (FOR BLOOD PRESSURE SUPPORT)
PREFILLED_SYRINGE | INTRAVENOUS | Status: AC
  Filled 2023-01-24: qty 30

## 2023-01-24 MED ORDER — ALBUTEROL SULFATE (2.5 MG/3ML) 0.083% IN NEBU
2.5000 mg | INHALATION_SOLUTION | Freq: Two times a day (BID) | RESPIRATORY_TRACT | Status: DC
  Administered 2023-01-25: 2.5 mg via RESPIRATORY_TRACT
  Filled 2023-01-24: qty 3

## 2023-01-24 MED ORDER — FLEET ENEMA 7-19 GM/118ML RE ENEM: 1.0000 | ENEMA | Freq: Once | RECTAL | Status: DC | PRN

## 2023-01-24 MED ORDER — ONDANSETRON HCL 4 MG PO TABS: 4.0000 mg | ORAL_TABLET | Freq: Four times a day (QID) | ORAL | Status: DC | PRN

## 2023-01-24 MED ORDER — HYDROCODONE-ACETAMINOPHEN 5-325 MG PO TABS: 1.0000 | ORAL_TABLET | Freq: Four times a day (QID) | ORAL | Status: DC | PRN

## 2023-01-24 MED ORDER — HYDROCODONE-ACETAMINOPHEN 5-325 MG PO TABS
1.0000 | ORAL_TABLET | ORAL | Status: DC | PRN
  Administered 2023-01-25 – 2023-01-27 (×4): 1 via ORAL
  Filled 2023-01-24 (×4): qty 1

## 2023-01-24 MED ORDER — SUCCINYLCHOLINE CHLORIDE 200 MG/10ML IV SOSY
PREFILLED_SYRINGE | INTRAVENOUS | Status: DC | PRN
  Administered 2023-01-24: 100 mg via INTRAVENOUS

## 2023-01-24 MED ORDER — ENOXAPARIN SODIUM 40 MG/0.4ML IJ SOSY
40.0000 mg | PREFILLED_SYRINGE | INTRAMUSCULAR | Status: DC
  Administered 2023-01-25 – 2023-01-30 (×6): 40 mg via SUBCUTANEOUS
  Filled 2023-01-24 (×6): qty 0.4

## 2023-01-24 MED ORDER — LACTATED RINGERS IV SOLN: INTRAVENOUS | Status: DC | PRN

## 2023-01-24 MED ORDER — ONDANSETRON HCL 4 MG/2ML IJ SOLN: 4.0000 mg | Freq: Four times a day (QID) | INTRAMUSCULAR | Status: DC | PRN

## 2023-01-24 MED ORDER — DOCUSATE SODIUM 100 MG PO CAPS
100.0000 mg | ORAL_CAPSULE | Freq: Two times a day (BID) | ORAL | Status: DC
  Administered 2023-01-24 – 2023-01-30 (×12): 100 mg via ORAL
  Filled 2023-01-24 (×12): qty 1

## 2023-01-24 MED ORDER — BISACODYL 10 MG RE SUPP: 10.0000 mg | Freq: Every day | RECTAL | Status: DC | PRN

## 2023-01-24 MED ORDER — KETOROLAC TROMETHAMINE 15 MG/ML IJ SOLN
INTRAMUSCULAR | Status: AC
  Filled 2023-01-24: qty 1

## 2023-01-24 MED ORDER — HYDROCODONE-ACETAMINOPHEN 5-325 MG PO TABS: 1.0000 | ORAL_TABLET | ORAL | Status: DC | PRN

## 2023-01-24 MED ORDER — LIDOCAINE HCL (CARDIAC) PF 100 MG/5ML IV SOSY
PREFILLED_SYRINGE | INTRAVENOUS | Status: DC | PRN
  Administered 2023-01-24: 60 mg via INTRAVENOUS

## 2023-01-24 MED ORDER — SODIUM CHLORIDE 0.9 % IV SOLN: INTRAVENOUS | Status: DC

## 2023-01-24 MED ORDER — HYDROMORPHONE HCL 1 MG/ML IJ SOLN
0.5000 mg | INTRAMUSCULAR | Status: DC | PRN
  Administered 2023-01-24: 0.5 mg via INTRAVENOUS
  Filled 2023-01-24: qty 0.5

## 2023-01-24 MED ORDER — EPHEDRINE 5 MG/ML INJ
INTRAVENOUS | Status: AC
  Filled 2023-01-24: qty 15

## 2023-01-24 MED ORDER — METOCLOPRAMIDE HCL 5 MG/ML IJ SOLN: 5.0000 mg | Freq: Three times a day (TID) | INTRAMUSCULAR | Status: DC | PRN

## 2023-01-24 MED ORDER — ACETAMINOPHEN 325 MG PO TABS
ORAL_TABLET | ORAL | Status: AC
  Filled 2023-01-24: qty 2

## 2023-01-24 MED ORDER — ACETAMINOPHEN 325 MG PO TABS
325.0000 mg | ORAL_TABLET | Freq: Four times a day (QID) | ORAL | Status: DC | PRN
  Administered 2023-01-28: 650 mg via ORAL
  Filled 2023-01-24: qty 2

## 2023-01-24 MED ORDER — MAGNESIUM HYDROXIDE 400 MG/5ML PO SUSP: 30.0000 mL | Freq: Every day | ORAL | Status: DC | PRN

## 2023-01-24 MED ORDER — PROPOFOL 10 MG/ML IV BOLUS
INTRAVENOUS | Status: AC
  Filled 2023-01-24: qty 40

## 2023-01-24 MED ORDER — PROPOFOL 500 MG/50ML IV EMUL
INTRAVENOUS | Status: DC | PRN
  Administered 2023-01-24: 100 mg via INTRAVENOUS

## 2023-01-24 SURGICAL SUPPLY — 1 items: KIT TURNOVER KIT A (KITS) ×1 IMPLANT

## 2023-01-24 NOTE — Anesthesia Postprocedure Evaluation (Signed)
Anesthesia Post Note  Patient: JAMEKIA GANNETT  Procedure(s) Performed: CLOSED REDUCTION HIP (Right: Hip)  Patient location during evaluation: PACU Anesthesia Type: General Level of consciousness: awake and alert Pain management: pain level controlled Vital Signs Assessment: post-procedure vital signs reviewed and stable Respiratory status: spontaneous breathing, nonlabored ventilation and respiratory function stable Cardiovascular status: blood pressure returned to baseline and stable Postop Assessment: no apparent nausea or vomiting Anesthetic complications: no   No notable events documented.   Last Vitals:  Vitals:   01/24/23 1430 01/24/23 1443  BP: 117/60 125/61  Pulse: 69 66  Resp: 19 20  Temp: 36.6 C (!) 36.4 C  SpO2: 100% 98%    Last Pain:  Vitals:   01/24/23 1443  TempSrc:   PainSc: 0-No pain                 Foye Deer

## 2023-01-24 NOTE — Hospital Course (Addendum)
Marilyn Romero is a 89 y.o. female with PMH significant for HTN, osteoarthritis, lumbar radiculopathy, GERD, impaired mobility, multiple falls and recurrent hip dislocations.  Came to the hospital from long-term care facility with hip pain, was found to have right hip dislocation again. OR closed reduction is performed 6/27. Patient was ready for discharge, but required nursing placement per PT.

## 2023-01-24 NOTE — Progress Notes (Signed)
  Progress Note   Patient: Marilyn Romero:[id number] DOB: [birth year] DOA: 01/23/2023     0 DOS: the patient was seen and examined on 01/24/2023   Brief hospital course: Marilyn Romero is a 89 y.o. female with PMH significant for HTN, osteoarthritis, lumbar radiculopathy, GERD, impaired mobility, multiple falls and recurrent hip dislocations.  Came to the hospital from long-term care facility with hip pain, was found to have right hip dislocation again. OR closed reduction is performed 6/27.   Principal Problem:   Hip dislocation, right (HCC) Active Problems:   GERD (gastroesophageal reflux disease)   Essential hypertension   Assessment and Plan: Right prosthetic hip dislocation History of recurrent dislocations Sent from nursing facility with complaint of right hip pain.  Did not have a fall.  Apparently she was not supposed to be ambulatory until cleared by orthopedics but she got up by self causing recurrent hip dislocation. X-ray pelvis showed superolateral dislocation of the right hip prosthesis femoral component. Patient is going to OR today for closed reduction.  Continue pain medicine for now.   Hypertension Continue lisinopril   HLD Continue Lipitor       Subjective:  Patient complaining of severe hip pain.  Physical Exam: Vitals:   01/24/23 0730 01/24/23 0930 01/24/23 0952 01/24/23 1254  BP: (!) 113/56 (!) 139/59 (!) 139/59 135/65  Pulse: 66 79 79 73  Resp:   20 16  Temp:   98.1 F (36.7 C) 98 F (36.7 C)  TempSrc:   Oral Temporal  SpO2: 98% 98% 98% 98%  Weight:      Height:       General exam: Appears calm and comfortable  Respiratory system: Clear to auscultation. Respiratory effort normal. Cardiovascular system: S1 & S2 heard, RRR. No JVD, murmurs, rubs, gallops or clicks. No pedal edema. Gastrointestinal system: Abdomen is nondistended, soft and nontender. No organomegaly or masses felt. Normal bowel sounds heard. Central nervous system: Alert and  oriented x2. No focal neurological deficits. Extremities: Symmetric 5 x 5 power. Skin: No rashes, lesions or ulcers Psychiatry: Mood & affect appropriate.    Data Reviewed:  X-ray and lab results reviewed.  Family Communication:   Disposition: Status is: Observation The patient remains OBS appropriate and will d/c before 2 midnights.     Time spent: 35 minutes  Author: Marrion Coy, MD 01/24/2023 12:57 PM  For on call review [email or web address].

## 2023-01-24 NOTE — Op Note (Signed)
01/24/2023  2:32 PM  Patient:   Marilyn Romero  Pre-Op Diagnosis:   Closed recurrent posterior right prothesic hip dislocation.  Post-Op Diagnosis:   Same.  Procedure:   Closed reduction of recurrent posterior right prosthetic hip dislocation.  Surgeon:   Maryagnes Amos, MD  Assistant:   None  Anesthesia:   IV sedation  Findings:   As above.  Complications:   None  EBL:   None  Fluids:   200 cc crystalloid  TT:   None  Drains:   None  Closure:   None  Implants:   None  Brief Clinical Note:   The patient is a 89 year old female who is now 7.5 years status post a right total hip arthroplasty. The patient has had numerous recurrent dislocations over the past few months, including last evening when she apparently got out of her bed without assistance. It is unclear exactly how it happened, but the hip redislocated. She was brought to the emergency room where x-rays confirmed a recurrent dislocation. The patient is brought to the operating room at this time for closed reduction under general anesthesia of this dislocation.  Procedure:   The patient was brought into the operating room and lain in the supine position. After adequate IV sedation was obtained, a timeout was performed to verify the appropriate surgical site. The hip was then reduced under fluoroscopic guidance using longitudinal traction with internal rotation and flexion while countertraction was applied to the pelvis. The hip was observed and felt to slip back into place. The leg lengths were now equal and leg rotation was symmetric to the contralateral side. The hip was stable to flexion to 90 with internal rotation to 10. The adequacy of reduction was confirmed by fluoroscopic imaging before the patient's right lower extremity was placed back into her hinged knee brace.  This time, the hinges were set at 20 degrees to see if this might help reduce the likelihood of recurrent dislocation. The patient was then awakened  and returned to the recovery room in satisfactory condition after tolerating the procedure well.

## 2023-01-24 NOTE — Anesthesia Preprocedure Evaluation (Addendum)
Anesthesia Evaluation  Patient identified by MRN, date of birth, ID band Patient confused    Reviewed: Allergy & Precautions, H&P , NPO status , Patient's Chart, lab work & pertinent test results, reviewed documented beta blocker date and time   History of Anesthesia Complications Negative for: history of anesthetic complications  Airway Mallampati: II  TM Distance: >3 FB Neck ROM: full    Dental  (+) Dental Advidsory Given, Chipped, Poor Dentition   Pulmonary neg pulmonary ROS   Pulmonary exam normal breath sounds clear to auscultation       Cardiovascular Exercise Tolerance: Good hypertension, (-) angina (-) Past MI and (-) Cardiac Stents Normal cardiovascular exam(-) dysrhythmias (-) Valvular Problems/Murmurs Rhythm:regular Rate:Normal     Neuro/Psych neg Seizures  Neuromuscular disease  negative psych ROS   GI/Hepatic Neg liver ROS, PUD,GERD  ,,  Endo/Other  negative endocrine ROS    Renal/GU negative Renal ROS  negative genitourinary   Musculoskeletal  (+) Arthritis ,    Abdominal Normal abdominal exam  (+)   Peds  Hematology negative hematology ROS (+)   Anesthesia Other Findings Past Medical History: 12/08/2014: Cancer of sigmoid colon (HCC) 10/07/2012: Colonic mass     Comment:  Overview:  Gets routine colonoscopies. Dx was 2013. 12/16/2014: DDD (degenerative disc disease), lumbar 06/27/2011: Diverticulitis 12/16/2014: Facet syndrome, lumbar 05/26/2012: GERD (gastroesophageal reflux disease) 06/18/2016: Insomnia     Comment:  Last Assessment & Plan:  Relevant Hx: Course: Daily               Update: Today's Plan: 04/29/2012: Lesion of bladder 10/04/2015: Lumbar radiculopathy 12/16/2014: Sacroiliac joint dysfunction of both sides 08/15/2015: Status post total replacement of right hip   Reproductive/Obstetrics negative OB ROS                             Anesthesia Physical Anesthesia  Plan  ASA: 3  Anesthesia Plan: General   Post-op Pain Management:    Induction: Intravenous  PONV Risk Score and Plan: 3 and Propofol infusion and TIVA  Airway Management Planned: Natural Airway and Mask  Additional Equipment:   Intra-op Plan:   Post-operative Plan:   Informed Consent: I have reviewed the patients History and Physical, chart, labs and discussed the procedure including the risks, benefits and alternatives for the proposed anesthesia with the patient or authorized representative who has indicated his/her understanding and acceptance.   Patient has DNR.  Discussed DNR with power of attorney and Continue DNR.   Dental Advisory Given  Plan Discussed with: Anesthesiologist, CRNA and Surgeon  Anesthesia Plan Comments:         Anesthesia Quick Evaluation

## 2023-01-24 NOTE — ED Notes (Signed)
Called daughter to consent with another nurse for surgery since Pt is AxOx2, but she stated she was not informed of the surgery at all. Informed ortho MD via secure chat, awaiting response

## 2023-01-24 NOTE — Transfer of Care (Cosign Needed)
Immediate Anesthesia Transfer of Care Note  Patient: Marilyn Romero  Procedure(s) Performed: CLOSED REDUCTION HIP (Right: Hip)  Patient Location: PACU  Anesthesia Type:General with native airway  Level of Consciousness: drowsy and patient cooperative  Airway & Oxygen Therapy: Patient Spontanous Breathing and Patient connected to face mask oxygen  Post-op Assessment: Report given to RN and Post -op Vital signs reviewed and stable  Post vital signs: Reviewed and stable  Last Vitals:  Vitals Value Taken Time  BP 117/60 01/24/23 1430  Temp 36.6 C 01/24/23 1430  Pulse 66 01/24/23 1432  Resp 16 01/24/23 1432  SpO2 100 % 01/24/23 1432  Vitals shown include unvalidated device data.  Last Pain:  Vitals:   01/24/23 1430  TempSrc:   PainSc: 0-No pain         Complications: No notable events documented.

## 2023-01-24 NOTE — Consult Note (Signed)
ORTHOPAEDIC CONSULTATION  REQUESTING PHYSICIAN: Marrion Coy, MD  Chief Complaint:   Right hip pain.  History of Present Illness: Marilyn Romero is an 89 y.o. female with multiple medical problems including dementia who lives in an assisted living facility and is status post a right total hip arthroplasty performed approximately 7.5 years ago.  The patient has had numerous prosthetic hip dislocations over the past several months, including 1 last week, one 3 weeks ago, and another about 3 months ago.  Apparently, the patient was in usual state of health last evening.  She had had her dinner and going to bed, but apparently tried to get out of bed and reinjured her right hip.  She was brought to the emergency room where x-rays demonstrated a recurrent posterior superior dislocation of the right hip prosthesis.  The patient does not appear to have sustained any additional injuries.  She has been admitted in preparation for a repeat reduction of the prosthetic right hip dislocation under anesthesia.  Past Medical History:  Diagnosis Date   Cancer of sigmoid colon (HCC) 12/08/2014   Colonic mass 10/07/2012   Overview:  Gets routine colonoscopies. Dx was 2013.   DDD (degenerative disc disease), lumbar 12/16/2014   Diverticulitis 06/27/2011   Facet syndrome, lumbar 12/16/2014   GERD (gastroesophageal reflux disease) 05/26/2012   Insomnia 06/18/2016   Last Assessment & Plan:  Relevant Hx: Course: Daily Update: Today's Plan:   Lesion of bladder 04/29/2012   Lumbar radiculopathy 10/04/2015   Sacroiliac joint dysfunction of both sides 12/16/2014   Status post total replacement of right hip 08/15/2015   Past Surgical History:  Procedure Laterality Date   COLON RESECTION     COLONOSCOPY WITH PROPOFOL N/A 07/02/2016   Procedure: COLONOSCOPY WITH PROPOFOL;  Surgeon: Midge Minium, MD;  Location: [facility];  Service: Endoscopy;  Laterality:  N/A;   ESOPHAGOGASTRODUODENOSCOPY (EGD) WITH PROPOFOL N/A 07/02/2016   Procedure: ESOPHAGOGASTRODUODENOSCOPY (EGD) WITH PROPOFOL;  Surgeon: Midge Minium, MD;  Location: [facility];  Service: Endoscopy;  Laterality: N/A;   HIP CLOSED REDUCTION Right 01/17/2023   Procedure: CLOSED REDUCTION HIP;  Surgeon: Christena Flake, MD;  Location: [facility];  Service: Orthopedics;  Laterality: Right;   HIP SURGERY Right    LUNG SURGERY     non-malignant mass on lung   Social History   Socioeconomic History   Marital status: Divorced    Spouse name: Not on file   Number of children: Not on file   Years of education: Not on file   Highest education level: Not on file  Occupational History   Not on file  Tobacco Use   Smoking status: Never   Smokeless tobacco: Never  Substance and Sexual Activity   Alcohol use: No    Alcohol/week: 0.0 standard drinks of alcohol   Drug use: No   Sexual activity: Never  Other Topics Concern   Not on file  Social History Narrative   Not on file   Social Determinants of Health   Financial Resource Strain: Not on file  Food Insecurity: Patient Unable To Answer (01/24/2023)   Hunger Vital Sign    Worried About Running Out of Food in the Last Year: Patient unable to answer    Ran Out of Food in the Last Year: Patient unable to answer  Transportation Needs: Patient Unable To Answer (01/24/2023)   PRAPARE - Transportation    Lack of Transportation (Medical): Patient unable to answer    Lack of Transportation (Non-Medical):  Patient unable to answer  Physical Activity: Not on file  Stress: Not on file  Social Connections: Not on file   Family History  Problem Relation Age of Onset   Early death Mother    Arthritis Father    Hypertension Father    Breast cancer Other    No Known Allergies Prior to Admission medications   Medication Sig Start Date End Date Taking? Authorizing Provider  acetaminophen (TYLENOL) 325 MG tablet Take 650 mg by mouth every 4  (four) hours as needed for headache, mild pain or fever.   Yes Slade-Hartman, Alvino Chapel, MD  ciprofloxacin (CIPRO) 500 MG tablet Take 500 mg by mouth 2 (two) times daily. 7 day course 01/16/23  Yes [provider]  diclofenac Sodium (VOLTAREN) 1 % GEL Apply 2 g topically 4 (four) times daily. Apply to right hip.   Yes [provider]  ferrous sulfate 325 (65 FE) MG tablet Take 325 mg by mouth daily with breakfast.   Yes Bryson Corona, NP  guaiFENesin (ROBITUSSIN) 100 MG/5ML liquid Take 15 mLs by mouth every 6 (six) hours as needed for cough.   Yes Slade-Hartman, Alvino Chapel, MD  HYDROcodone-acetaminophen (NORCO) 5-325 MG tablet Take 1 tablet by mouth every 6 (six) hours as needed for moderate pain or severe pain. 06/01/19  Yes [provider]  lidocaine (LIDOCAINE PAIN RELIEF) 4 % Place 1 patch onto the skin daily. Apply to right hip for 12 hours in a 24 hour period. 09/13/22  Yes [provider]  lisinopril (ZESTRIL) 10 MG tablet Take 10 mg by mouth daily.   Yes [provider]  magnesium hydroxide (MILK OF MAGNESIA) 400 MG/5ML suspension Take 30 mLs by mouth daily as needed for mild constipation.   Yes [provider]  Multiple Vitamins-Minerals (CEROVITE SENIOR) TABS Take 1 tablet by mouth daily.   Yes Slade-Hartman, Alvino Chapel, MD  atorvastatin (LIPITOR) 20 MG tablet Take 20 mg by mouth daily. Patient not taking: Reported on 01/24/2023    [provider]  ibuprofen (ADVIL,MOTRIN) 200 MG tablet Take 200 mg by mouth every 6 (six) hours as needed. Reported on 10/31/2015 Patient not taking: Reported on 01/24/2023    [provider]  potassium chloride SA (KLOR-CON M) 20 MEQ tablet Take 1 tablet (20 mEq total) by mouth daily for 5 doses. 10/30/22 11/04/22  Shaune Pollack, MD  traMADol (ULTRAM) 50 MG tablet Take 1 tablet twice daily for 5 days then every 12 hours as needed for severe pain. 10/30/22   Shaune Pollack, MD   DG Pelvis 1-2  Views  Result Date: 01/23/2023 CLINICAL DATA:  Right hip prosthesis with recurrent dislocation. EXAM: PELVIS - 1-2 VIEW COMPARISON:  Similar study 01/17/2023 and subsequent postreduction spot fluoroscopic view. FINDINGS: Generalized osteopenia. Right hip arthroplasty is again noted with superolateral dislocation of the femoral component. No pelvic fracture or diastasis is seen AP. There is mild degenerative arthrosis of the SI joints, symphysis pubis and left hip. Tubular calcific plaques in the superficial femoral arteries are again shown. Degenerative change of the visualized lower lumbar spine. IMPRESSION: 1. Superolateral dislocation of the right hip prosthesis femoral component. 2. Osteopenia and degenerative change. 3. Peripheral vascular disease. Electronically Signed   By: Almira Bar M.D.   On: 01/23/2023 20:59    Positive ROS: All other systems have been reviewed and were otherwise negative with the exception of those mentioned in the HPI and as above.  Physical Exam: General:  Alert, no acute distress Psychiatric:  Patient is not competent for consent, but exhibits normal mood and affect   Cardiovascular:  No pedal edema Respiratory:  No wheezing, non-labored breathing GI:  Abdomen is soft and non-tender Skin:  No lesions in the area of chief complaint Neurologic:  Sensation intact distally Lymphatic:  No axillary or cervical lymphadenopathy  Orthopedic Exam:  Orthopedic examination is limited to the right hip and lower extremity.  The right lower extremity is somewhat shortened and internally rotated as compared to the left.  Skin inspection around the right hip is notable for a well-healed surgical incision, but otherwise is unremarkable.  No swelling, erythema, ecchymosis, abrasions, or other skin abnormalities are identified.   X-rays:  Recent x-rays of the pelvis and right hip are available for review.  These films demonstrate a posterior superior dislocation of the right total  hip arthroplasty.  The femoral and acetabular components appear to be in satisfactory position and without evidence of loosening.  No fractures or other acute bony abnormalities are identified.   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  Plan: The treatment options, including both surgical and nonsurgical choices, have been discussed in detail with the patient and her daughter (by phone).  They both would like to proceed with surgical intervention to include a closed reduction of her prosthetic right hip dislocation.  The risks (including bleeding, infection, nerve and/or blood vessel injury, persistent or recurrent pain, loosening or failure of the components, recurrent dislocation, need for further surgery, blood clots, strokes, heart attacks or arrhythmias, pneumonia, etc.) and benefits of the surgical procedure were discussed.  The patient and her daughter state their understanding and agree to proceed.  A formal written consent will be obtained by the nursing staff.   Thank you for asking me to participate in the care of this most pleasant yet unfortunate woman.  I will be happy to follow her with you.   Maryagnes Amos, MD  Beeper #:  [phone]  01/24/2023 1:57 PM

## 2023-01-25 ENCOUNTER — Encounter: Payer: Self-pay | Admitting: Surgery

## 2023-01-25 DIAGNOSIS — S73004A Unspecified dislocation of right hip, initial encounter: Secondary | ICD-10-CM | POA: Diagnosis not present

## 2023-01-25 DIAGNOSIS — E876 Hypokalemia: Secondary | ICD-10-CM | POA: Diagnosis not present

## 2023-01-25 DIAGNOSIS — I10 Essential (primary) hypertension: Secondary | ICD-10-CM | POA: Diagnosis not present

## 2023-01-25 DIAGNOSIS — D649 Anemia, unspecified: Secondary | ICD-10-CM | POA: Insufficient documentation

## 2023-01-25 DIAGNOSIS — T84020A Dislocation of internal right hip prosthesis, initial encounter: Secondary | ICD-10-CM | POA: Diagnosis not present

## 2023-01-25 LAB — MRSA NEXT GEN BY PCR, NASAL: MRSA by PCR Next Gen: NOT DETECTED

## 2023-01-25 MED ORDER — POTASSIUM CHLORIDE CRYS ER 20 MEQ PO TBCR: 20.0000 meq | EXTENDED_RELEASE_TABLET | Freq: Every day | ORAL | 0 refills | Status: DC

## 2023-01-25 MED ORDER — ALBUTEROL SULFATE (2.5 MG/3ML) 0.083% IN NEBU: 2.5000 mg | INHALATION_SOLUTION | Freq: Four times a day (QID) | RESPIRATORY_TRACT | Status: DC | PRN

## 2023-01-25 MED ORDER — HYDROCODONE-ACETAMINOPHEN 5-325 MG PO TABS: 1.0000 | ORAL_TABLET | Freq: Four times a day (QID) | ORAL | 0 refills | Status: DC | PRN

## 2023-01-25 NOTE — TOC CM/SW Note (Addendum)
Per chart review patient is from [facility]. Per MD patient is medically stable to return. Left VM for Dustin at the [facility] to confirm if patient can return today. Awaiting return call.   11:17- Per PT, patient needs 24 hour supervision at ALF and may need rehab. Called Dustin at the [facility]. He states patient will need to go to SNF for STR before returning to [facility]. Will do SNF work up. Updated MD and PT.    Alfonso Ramus, LCSW Transitions of Care Department [phone]

## 2023-01-25 NOTE — Plan of Care (Signed)
  Problem: Activity: Goal: Risk for activity intolerance will decrease Outcome: Progressing   Problem: Safety: Goal: Ability to remain free from injury will improve Outcome: Progressing   Problem: Skin Integrity: Goal: Risk for impaired skin integrity will decrease Outcome: Progressing   

## 2023-01-25 NOTE — TOC Initial Note (Addendum)
Transition of Care [facility]) - Initial/Assessment Note    Patient Details  Name: Marilyn Romero MRN: [id number] Date of Birth: April 29, 1934  Transition of Care [facility]) CM/SW Contact:    Liliana Cline, LCSW Phone Number: 01/25/2023, 1:33 PM  Clinical Narrative:                 CSW spoke to patient's daughter [name] by phone. Patient is from [facility]. [facility] and PT states patient needs to go to SNF for STR before returning. [name] states she agrees to this, prefers [facility] in [place] since patient has been there before. SNF work up started. Asked [name] at [facility] to review. [name] states he will review referral, if accepted they can take on Monday. No weekend admissions.  4:33- Call to [facility] at [facility] to inqiure if he was able to accept patient. Awaiting response.   5:15- [name] with [facility] states he cannot make a bed offer, asked for reason, awaiting response. Notified RN and MD that patient would need to be telesitter and sitter free for SNF.   Expected Discharge Plan: Skilled Nursing Facility Barriers to Discharge: Continued Medical Work up   Patient Goals and CMS Choice Patient states their goals for this hospitalization and ongoing recovery are:: SNF CMS Medicare.gov Compare Post Acute Care list provided to:: Patient Represenative (must comment) Choice offered to / list presented to : Adult Children      Expected Discharge Plan and Services       Living arrangements for the past 2 months: Assisted Living Facility Expected Discharge Date: 01/25/23                                    Prior Living Arrangements/Services Living arrangements for the past 2 months: Assisted Living Facility Lives with:: Facility Resident Patient language and need for interpreter reviewed:: Yes Do you feel safe going back to the place where you live?: Yes            Criminal Activity/Legal Involvement Pertinent to Current Situation/Hospitalization: No - Comment as needed  Activities of  Daily Living Home Assistive Devices/Equipment: Other (Comment) (comes from facility; unable to answer question) ADL Screening (condition at time of admission) Patient's cognitive ability adequate to safely complete daily activities?: Yes Is the patient deaf or have difficulty hearing?: No Does the patient have difficulty seeing, even when wearing glasses/contacts?: No Does the patient have difficulty concentrating, remembering, or making decisions?: Yes Patient able to express need for assistance with ADLs?: Yes Does the patient have difficulty dressing or bathing?: Yes Independently performs ADLs?: Yes (appropriate for developmental age) Does the patient have difficulty walking or climbing stairs?: Yes Weakness of Legs: Right Weakness of Arms/Hands: None  Permission Sought/Granted Permission sought to share information with : Facility Industrial/product designer granted to share information with : Yes, Verbal Permission Granted (by daughter [name])              Emotional Assessment         Alcohol / Substance Use: Not Applicable Psych Involvement: No (comment)  Admission diagnosis:  Hip dislocation, right (HCC) [S73.004A] Dislocation of right hip, initial encounter [facility]) [S73.004A] Patient Active Problem List   Diagnosis Date Noted   Hypokalemia 01/25/2023   Acute anemia 01/25/2023   Hip dislocation, right (HCC) 01/23/2023   Baker's cyst of knee, left 12/10/2021   SBO (small bowel obstruction) (HCC) 12/08/2021   UTI (  urinary tract infection) 12/08/2021   Essential hypertension 12/08/2021   Dysphagia 12/08/2021   Chronic pain syndrome 01/10/2021   Personal history of colon cancer    Abdominal pain, epigastric    Acute peptic ulcer of stomach    Gastritis without bleeding    Insomnia 06/18/2016   Musculoskeletal pain 10/04/2015   Lumbar radiculopathy 10/04/2015   Status post total replacement of right hip 08/15/2015   Primary osteoarthritis of one hip  08/15/2015   Pain due to total hip replacement (HCC) 08/15/2015   DDD (degenerative disc disease), lumbar 12/16/2014   Facet syndrome, lumbar 12/16/2014   Sacroiliac joint dysfunction of both sides 12/16/2014   Cancer of sigmoid colon (HCC) 12/08/2014   Pure hypercholesterolemia 08/10/2013   Colonic mass 10/07/2012   GERD (gastroesophageal reflux disease) 05/26/2012   Lesion of bladder 04/29/2012   Atrophic vaginitis 04/29/2012   Dysuria 04/17/2012   Diverticulitis 06/27/2011   PCP:  Leim Fabry, MD Pharmacy:   [facility], [place] - [address] [place] [address] Phone: [phone] Fax: [phone]     Social Determinants of Health (SDOH) Social History: SDOH Screenings   Food Insecurity: Patient Unable To Answer (01/24/2023)  Housing: High Risk (01/24/2023)  Transportation Needs: Patient Unable To Answer (01/24/2023)  Utilities: Patient Unable To Answer (01/24/2023)  Depression (PHQ2-9): Low Risk  (06/27/2022)  Tobacco Use: Low Risk  (01/25/2023)   SDOH Interventions:     Readmission Risk Interventions     No data to display

## 2023-01-25 NOTE — Progress Notes (Signed)
Seen by PT, recommended SNF placement.  Cancel discharge.

## 2023-01-25 NOTE — Discharge Summary (Signed)
Physician Discharge Summary   Patient: Marilyn Romero MRN: [id number] DOB: 1934/04/21  Admit date:     01/23/2023  Discharge date: 01/25/23  Discharge Physician: Marrion Coy   PCP: Leim Fabry, MD   Recommendations at discharge:   Follow-up with PCP in 1 week. Follow-up with Dr. Audelia Acton from orthopedics in 1 to 2 weeks.  Discharge Diagnoses: Principal Problem:   Hip dislocation, right (HCC) Active Problems:   GERD (gastroesophageal reflux disease)   Essential hypertension   Hypokalemia   Acute anemia  Resolved Problems:   * No resolved hospital problems. *  Hospital Course: Marilyn Romero is a 89 y.o. female with PMH significant for HTN, osteoarthritis, lumbar radiculopathy, GERD, impaired mobility, multiple falls and recurrent hip dislocations.  Came to the hospital from long-term care facility with hip pain, was found to have right hip dislocation again. OR closed reduction is performed 6/27.  Assessment and Plan: Right prosthetic hip dislocation History of recurrent dislocations Sent from nursing facility with complaint of right hip pain.  Did not have a fall.  Apparently she was not supposed to be ambulatory until cleared by orthopedics but she got up by self causing recurrent hip dislocation. X-ray pelvis showed superolateral dislocation of the right hip prosthesis femoral component. Patient has closed reduction performed on 6/27.  Pain much better.  Discussed with orthopedics, medically stable to be discharged.  Patient will be continued on some as needed pain medicine.  Due to recurrent joint dislocation, patient be followed with orthopedics for future procedure.  Acute anemia. Patient has slight drop of hemoglobin, no evidence of bleeding.  Could be due to IV fluids with dilution.  Stable.  Hypokalemia. Repleted, potassium normalized.  Continue with potassium supplement for 5 days.  Follow-up with PCP as outpatient.   Hypertension Continue lisinopril    HLD Continue Lipitor        Consultants: Orthopedics Procedures performed: Closed hip joint reduction. Disposition:  ALF Diet recommendation:  Discharge Diet Orders (From admission, onward)     Start     Ordered   01/25/23 0000  Diet - low sodium heart healthy        01/25/23 1021           Cardiac diet DISCHARGE MEDICATION: Allergies as of 01/25/2023   No Known Allergies      Medication List     STOP taking these medications    atorvastatin 20 MG tablet Commonly known as: LIPITOR   ciprofloxacin 500 MG tablet Commonly known as: CIPRO   ibuprofen 200 MG tablet Commonly known as: ADVIL   traMADol 50 MG tablet Commonly known as: Ultram       TAKE these medications    acetaminophen 325 MG tablet Commonly known as: TYLENOL Take 650 mg by mouth every 4 (four) hours as needed for headache, mild pain or fever.   Cerovite Senior Tabs Take 1 tablet by mouth daily.   diclofenac Sodium 1 % Gel Commonly known as: VOLTAREN Apply 2 g topically 4 (four) times daily. Apply to right hip.   ferrous sulfate 325 (65 FE) MG tablet Take 325 mg by mouth daily with breakfast.   guaiFENesin 100 MG/5ML liquid Commonly known as: ROBITUSSIN Take 15 mLs by mouth every 6 (six) hours as needed for cough.   HYDROcodone-acetaminophen 5-325 MG tablet Commonly known as: Norco Take 1 tablet by mouth every 6 (six) hours as needed for moderate pain or severe pain.   Lidocaine Pain Relief 4 % Generic drug:  lidocaine Place 1 patch onto the skin daily. Apply to right hip for 12 hours in a 24 hour period.   lisinopril 10 MG tablet Commonly known as: ZESTRIL Take 10 mg by mouth daily.   magnesium hydroxide 400 MG/5ML suspension Commonly known as: MILK OF MAGNESIA Take 30 mLs by mouth daily as needed for mild constipation.   potassium chloride SA 20 MEQ tablet Commonly known as: KLOR-CON M Take 1 tablet (20 mEq total) by mouth daily for 5 doses.        Follow-up  Information     Reinaldo Berber, MD Follow up in 2 week(s).   Specialty: Orthopedic Surgery Contact information: [address] [place] [address] [phone]         Leim Fabry, MD Follow up in 1 week(s).   Specialty: Family Medicine Contact information: [address] [place] [address] [phone]                Discharge Exam: Marilyn Romero Weights   01/23/23 2011  Weight: 57.2 kg   General exam: Appears calm and comfortable  Respiratory system: Clear to auscultation. Respiratory effort normal. Cardiovascular system: S1 & S2 heard, RRR. No JVD, murmurs, rubs, gallops or clicks. No pedal edema. Gastrointestinal system: Abdomen is nondistended, soft and nontender. No organomegaly or masses felt. Normal bowel sounds heard. Central nervous system: Alert and oriented x2. No focal neurological deficits. Extremities: Symmetric 5 x 5 power. Skin: No rashes, lesions or ulcers Psychiatry: Mood & affect appropriate.    Condition at discharge: fair  The results of significant diagnostics from this hospitalization (including imaging, microbiology, ancillary and laboratory) are listed below for reference.   Imaging Studies: CT HIP RIGHT WO CONTRAST  Result Date: 01/24/2023 CLINICAL DATA:  Surgical planning for right total hip arthroplasty revision. Multiple recent right hip arthroplasty dislocations. EXAM: CT OF THE RIGHT HIP WITHOUT CONTRAST TECHNIQUE: Multidetector CT imaging of the right hip was performed according to the standard protocol. Multiplanar CT image reconstructions were also generated. RADIATION DOSE REDUCTION: This exam was performed according to the departmental dose-optimization program which includes automated exposure control, adjustment of the mA and/or kV according to patient size and/or use of iterative reconstruction technique. COMPARISON:  Right hip x-rays from same day. CT abdomen pelvis dated Dec 08, 2021. FINDINGS:  Bones/Joint/Cartilage Prior right total hip arthroplasty. No evidence of hardware failure or loosening. No fracture or dislocation. No joint effusion. Ligaments Ligaments are suboptimally evaluated by CT. Muscles and Tendons Atrophy of the right piriformis and gluteus medius and minimus muscles. Soft tissue Soft tissue swelling about the right hip. No fluid collection or hematoma. No soft tissue mass. Large stool ball in the rectum. IMPRESSION: 1. Prior right total hip arthroplasty without hardware complication. Electronically Signed   By: Obie Dredge M.D.   On: 01/24/2023 19:32   DG HIP UNILAT WITH PELVIS 2-3 VIEWS RIGHT  Result Date: 01/24/2023 CLINICAL DATA:  Elective surgery.  Closed reduction right hip. EXAM: DG HIP (WITH OR WITHOUT PELVIS) 2-3V RIGHT COMPARISON:  Pelvic radiograph yesterday FINDINGS: Two fluoroscopic spot views of the right hip obtained in the operating room. The previous arthroplasty dislocation has been reduced, the femoral component is seated in the acetabular component. Fluoroscopy time 57 seconds. Dose 6.2 mGy. IMPRESSION: Intraoperative fluoroscopy during reduction of right hip arthroplasty dislocation. Electronically Signed   By: Narda Rutherford M.D.   On: 01/24/2023 16:08   DG C-Arm 1-60 Min-No Report  Result Date: 01/24/2023 Fluoroscopy was utilized by  the requesting physician.  No radiographic interpretation.   DG Pelvis 1-2 Views  Result Date: 01/23/2023 CLINICAL DATA:  Right hip prosthesis with recurrent dislocation. EXAM: PELVIS - 1-2 VIEW COMPARISON:  Similar study 01/17/2023 and subsequent postreduction spot fluoroscopic view. FINDINGS: Generalized osteopenia. Right hip arthroplasty is again noted with superolateral dislocation of the femoral component. No pelvic fracture or diastasis is seen AP. There is mild degenerative arthrosis of the SI joints, symphysis pubis and left hip. Tubular calcific plaques in the superficial femoral arteries are again shown.  Degenerative change of the visualized lower lumbar spine. IMPRESSION: 1. Superolateral dislocation of the right hip prosthesis femoral component. 2. Osteopenia and degenerative change. 3. Peripheral vascular disease. Electronically Signed   By: Almira Bar M.D.   On: 01/23/2023 20:59   DG C-Arm 1-60 Min  Result Date: 01/17/2023 CLINICAL DATA:  Close reduction of right hip EXAM: DG HIP (WITH OR WITHOUT PELVIS) 2-3V RIGHT; DG C-ARM 1-60 MIN COMPARISON:  Film from earlier in the same day. FLUOROSCOPY TIME:  Radiation Exposure Index (as provided by the fluoroscopic device): Not available If the device does not provide the exposure index: Fluoroscopy Time:  2 minutes 18 seconds Number of Acquired Images:  2 FINDINGS: Interval reduction of the femoral component into the acetabular component is noted. IMPRESSION: Status post reduction of right hip prosthesis. Electronically Signed   By: Alcide Clever M.D.   On: 01/17/2023 22:17   DG HIP UNILAT WITH PELVIS 2-3 VIEWS RIGHT  Result Date: 01/17/2023 CLINICAL DATA:  Close reduction of right hip EXAM: DG HIP (WITH OR WITHOUT PELVIS) 2-3V RIGHT; DG C-ARM 1-60 MIN COMPARISON:  Film from earlier in the same day. FLUOROSCOPY TIME:  Radiation Exposure Index (as provided by the fluoroscopic device): Not available If the device does not provide the exposure index: Fluoroscopy Time:  2 minutes 18 seconds Number of Acquired Images:  2 FINDINGS: Interval reduction of the femoral component into the acetabular component is noted. IMPRESSION: Status post reduction of right hip prosthesis. Electronically Signed   By: Alcide Clever M.D.   On: 01/17/2023 22:17   DG Pelvis 1-2 Views  Result Date: 01/17/2023 CLINICAL DATA:  Status post attempted reduction of right hip EXAM: PELVIS - 1-2 VIEW COMPARISON:  Radiographs 01/17/2023 at 10:18 a.m. FINDINGS: No significant change from earlier today. Superior-lateral dislocated right hip arthroplasty femoral stem prosthesis. Demineralization.  No acute fracture. Degenerative changes left hip, SI joints, pubic symphysis, lumbar spine. IMPRESSION: Similar dislocated right hip arthroplasty. Electronically Signed   By: Minerva Fester M.D.   On: 01/17/2023 20:37   DG Hip Unilat W or Wo Pelvis 2-3 Views Right  Result Date: 01/17/2023 CLINICAL DATA:  Pain after fall EXAM: DG HIP (WITH OR WITHOUT PELVIS) 3V RIGHT COMPARISON:  X-ray 01/02/2023 and older FINDINGS: Osteopenia. Right hip arthroplasty again identified with screw fixated acetabular cup and Press-Fit femoral stem with dislocation of the prosthesis. The femoral component is lateral and superior and posterior to the acetabular cup. Slight asymmetric appearance to the femoral stem with proximally 2 mm of lucency between the stem in the adjacent bone. Please correlate for any signs of loosening. Otherwise no fracture or dislocation. Degenerative changes seen of the visualized lumbar spine at the edge of the imaging field. IMPRESSION: Right hip arthroplasty dislocation again identified. Electronically Signed   By: Karen Kays M.D.   On: 01/17/2023 18:47   DG Pelvis 1-2 Views  Result Date: 01/02/2023 CLINICAL DATA:  Postreduction EXAM: PELVIS-4 consecutive frontal views  COMPARISON:  X-ray earlier 01/02/2023 FINDINGS: Initial postreduction attempt continues to have dislocated right hip arthroplasty with the femoral component superior and lateral to the acetabular cup. The acetabular cup is screwed in position. Global osteopenia with scattered mild degenerative changes. Second and third attempt is similar in appearance. Fourth attempt has relocation. No separate underlying fracture. Imaging was obtained to aid in treatment. IMPRESSION: Postreduction. Electronically Signed   By: Karen Kays M.D.   On: 01/02/2023 10:51   DG Pelvis 1-2 Views  Result Date: 01/02/2023 CLINICAL DATA:  Postreduction EXAM: PELVIS-4 consecutive frontal views COMPARISON:  X-ray earlier 01/02/2023 FINDINGS: Initial  postreduction attempt continues to have dislocated right hip arthroplasty with the femoral component superior and lateral to the acetabular cup. The acetabular cup is screwed in position. Global osteopenia with scattered mild degenerative changes. Second and third attempt is similar in appearance. Fourth attempt has relocation. No separate underlying fracture. Imaging was obtained to aid in treatment. IMPRESSION: Postreduction. Electronically Signed   By: Karen Kays M.D.   On: 01/02/2023 10:51   DG Pelvis 1-2 Views  Result Date: 01/02/2023 CLINICAL DATA:  Postreduction EXAM: PELVIS-4 consecutive frontal views COMPARISON:  X-ray earlier 01/02/2023 FINDINGS: Initial postreduction attempt continues to have dislocated right hip arthroplasty with the femoral component superior and lateral to the acetabular cup. The acetabular cup is screwed in position. Global osteopenia with scattered mild degenerative changes. Second and third attempt is similar in appearance. Fourth attempt has relocation. No separate underlying fracture. Imaging was obtained to aid in treatment. IMPRESSION: Postreduction. Electronically Signed   By: Karen Kays M.D.   On: 01/02/2023 10:51   DG Pelvis 1-2 Views  Result Date: 01/02/2023 CLINICAL DATA:  Postreduction EXAM: PELVIS-4 consecutive frontal views COMPARISON:  X-ray earlier 01/02/2023 FINDINGS: Initial postreduction attempt continues to have dislocated right hip arthroplasty with the femoral component superior and lateral to the acetabular cup. The acetabular cup is screwed in position. Global osteopenia with scattered mild degenerative changes. Second and third attempt is similar in appearance. Fourth attempt has relocation. No separate underlying fracture. Imaging was obtained to aid in treatment. IMPRESSION: Postreduction. Electronically Signed   By: Karen Kays M.D.   On: 01/02/2023 10:51   DG Chest 1 View  Result Date: 01/02/2023 CLINICAL DATA:  Fall. EXAM: CHEST  1 VIEW  COMPARISON:  11/10/2014. FINDINGS: Low lung volumes accentuate the pulmonary vasculature and cardiomediastinal silhouette. No consolidation or pulmonary edema. Stable chronic scarring along the left costophrenic sulcus. No pleural effusion or pneumothorax. IMPRESSION: No evidence of acute cardiopulmonary disease. Electronically Signed   By: Orvan Falconer M.D.   On: 01/02/2023 08:01   DG HIP UNILAT W OR W/O PELVIS 2-3 VIEWS RIGHT  Result Date: 01/02/2023 CLINICAL DATA:  Pain. EXAM: DG HIP (WITH OR WITHOUT PELVIS) 2-3V RIGHT COMPARISON:  Hip radiograph 10/30/2022. FINDINGS: Three views of the pelvis and right hip. Lateral dislocation of the right hip prosthesis with superior migration. No evidence of periprosthetic fracture. IMPRESSION: Lateral dislocation of the right hip prosthesis. Electronically Signed   By: Orvan Falconer M.D.   On: 01/02/2023 07:59    Microbiology: Results for orders placed or performed during the hospital encounter of 01/23/23  MRSA Next Gen by PCR, Nasal     Status: None   Collection Time: 01/25/23  7:23 AM   Specimen: Nasal Mucosa; Nasal Swab  Result Value Ref Range Status   MRSA by PCR Next Gen NOT DETECTED NOT DETECTED Final    Comment: (NOTE) The GeneXpert  MRSA Assay (FDA approved for NASAL specimens only), is one component of a comprehensive MRSA colonization surveillance program. It is not intended to diagnose MRSA infection nor to guide or monitor treatment for MRSA infections. Test performance is not FDA approved in patients less than 43 years old. Performed at [facility], [address] [place], [place] [address]     Labs: CBC: Recent Labs  Lab 01/23/23 2201 01/24/23 0422  WBC 8.6 7.7  NEUTROABS 5.5  --   HGB 11.5* 10.7*  HCT 35.8* 34.3*  MCV 89.5 91.2  PLT 371 327   Basic Metabolic Panel: Recent Labs  Lab 01/23/23 2201 01/24/23 0422  NA 139 140  K 3.5 3.6  CL 108 109  CO2 24 25  GLUCOSE 116* 115*  BUN 13 12  CREATININE  0.71 0.69  CALCIUM 9.0 9.0   Liver Function Tests: Recent Labs  Lab 01/23/23 2201  AST 16  ALT 10  ALKPHOS 64  BILITOT 0.6  PROT 6.4*  ALBUMIN 3.4*   CBG: No results for input(s): "GLUCAP" in the last 168 hours.  Discharge time spent: greater than 30 minutes.  Signed: Marrion Coy, MD [facility] 01/25/2023

## 2023-01-25 NOTE — [place] FL2 (Signed)
[place] MEDICAID FL2 LEVEL OF CARE FORM     IDENTIFICATION  Patient Name: Marilyn Romero Birthdate: [birth year] Sex: female Admission Date (Current Location): 01/23/2023  [facility] and [place] Number:  Chiropodist and Address:  [facility], [address], [place], [place] [address]      Provider Number: [id number]  Attending Physician Name and Address:  Marrion Coy, MD  Relative Name and Phone Number:  Marilouise, Daoust (Daughter) [phone] [facility] Phone)    Current Level of Care: Hospital Recommended Level of Care: Skilled Nursing Facility Prior Approval Number:    Date Approved/Denied:   PASRR Number: [id number]  Discharge Plan:      Current Diagnoses: Patient Active Problem List   Diagnosis Date Noted   Hypokalemia 01/25/2023   Acute anemia 01/25/2023   Hip dislocation, right (HCC) 01/23/2023   Baker's cyst of knee, left 12/10/2021   SBO (small bowel obstruction) (HCC) 12/08/2021   UTI (urinary tract infection) 12/08/2021   Essential hypertension 12/08/2021   Dysphagia 12/08/2021   Chronic pain syndrome 01/10/2021   Personal history of colon cancer    Abdominal pain, epigastric    Acute peptic ulcer of stomach    Gastritis without bleeding    Insomnia 06/18/2016   Musculoskeletal pain 10/04/2015   Lumbar radiculopathy 10/04/2015   Status post total replacement of right hip 08/15/2015   Primary osteoarthritis of one hip 08/15/2015   Pain due to total hip replacement (HCC) 08/15/2015   DDD (degenerative disc disease), lumbar 12/16/2014   Facet syndrome, lumbar 12/16/2014   Sacroiliac joint dysfunction of both sides 12/16/2014   Cancer of sigmoid colon (HCC) 12/08/2014   Pure hypercholesterolemia 08/10/2013   Colonic mass 10/07/2012   GERD (gastroesophageal reflux disease) 05/26/2012   Lesion of bladder 04/29/2012   Atrophic vaginitis 04/29/2012   Dysuria 04/17/2012   Diverticulitis 06/27/2011    Orientation RESPIRATION  BLADDER Height & Weight     Self, Situation, Place  Normal External catheter Weight: 126 lb (57.2 kg) Height:  5\' 7"  (170.2 cm)  BEHAVIORAL SYMPTOMS/MOOD NEUROLOGICAL BOWEL NUTRITION STATUS        Diet (heart)  AMBULATORY STATUS COMMUNICATION OF NEEDS Skin   Limited Assist Verbally Normal                       Personal Care Assistance Level of Assistance  Bathing, Feeding, Dressing Bathing Assistance: Limited assistance Feeding assistance: Limited assistance Dressing Assistance: Limited assistance     Functional Limitations Info             SPECIAL CARE FACTORS FREQUENCY  PT (By licensed PT), OT (By licensed OT)     PT Frequency: 5 times per week OT Frequency: 5 times per week            Contractures      Additional Factors Info  Code Status, Allergies Code Status Info: full Allergies Info: nka           Current Medications (01/25/2023):  This is the current hospital active medication list Current Facility-Administered Medications  Medication Dose Route Frequency Provider Last Rate Last Admin   acetaminophen (TYLENOL) suppository 650 mg  650 mg Rectal Q6H PRN Poggi, Excell Seltzer, MD       acetaminophen (TYLENOL) tablet 325-650 mg  325-650 mg Oral Q6H PRN Poggi, Excell Seltzer, MD       albuterol (PROVENTIL) (2.5 MG/3ML) 0.083% nebulizer solution 2.5 mg  2.5 mg Nebulization Q6H PRN Chipper Herb,  Dekui, MD       atorvastatin (LIPITOR) tablet 20 mg  20 mg Oral Daily Poggi, Excell Seltzer, MD   20 mg at 01/24/23 2241   bisacodyl (DULCOLAX) suppository 10 mg  10 mg Rectal Daily PRN Poggi, Excell Seltzer, MD       diphenhydrAMINE (BENADRYL) 12.5 MG/5ML elixir 12.5-25 mg  12.5-25 mg Oral Q4H PRN Poggi, Excell Seltzer, MD       docusate sodium (COLACE) capsule 100 mg  100 mg Oral BID Poggi, Excell Seltzer, MD   100 mg at 01/25/23 1026   enoxaparin (LOVENOX) injection 40 mg  40 mg Subcutaneous Q24H Poggi, Excell Seltzer, MD   40 mg at 01/25/23 7829   hydrALAZINE (APRESOLINE) injection 10 mg  10 mg Intravenous Q6H PRN Poggi,  Excell Seltzer, MD       HYDROcodone-acetaminophen (NORCO/VICODIN) 5-325 MG per tablet 1-2 tablet  1-2 tablet Oral Q4H PRN Celene Squibb, RPH   1 tablet at 01/25/23 1058   lisinopril (ZESTRIL) tablet 5 mg  5 mg Oral Daily Poggi, Excell Seltzer, MD   5 mg at 01/25/23 1026   magnesium hydroxide (MILK OF MAGNESIA) suspension 30 mL  30 mL Oral Daily PRN Poggi, Excell Seltzer, MD       metoCLOPramide (REGLAN) tablet 5-10 mg  5-10 mg Oral Q8H PRN Poggi, Excell Seltzer, MD       Or   metoCLOPramide (REGLAN) injection 5-10 mg  5-10 mg Intravenous Q8H PRN Poggi, Excell Seltzer, MD       ondansetron (ZOFRAN) tablet 4 mg  4 mg Oral Q6H PRN Poggi, Excell Seltzer, MD       Or   ondansetron (ZOFRAN) injection 4 mg  4 mg Intravenous Q6H PRN Poggi, Excell Seltzer, MD       polyethylene glycol (MIRALAX / GLYCOLAX) packet 17 g  17 g Oral Daily PRN Poggi, Excell Seltzer, MD       senna (SENOKOT) tablet 8.6 mg  1 tablet Oral BID Poggi, Excell Seltzer, MD   8.6 mg at 01/25/23 1026   sodium phosphate (FLEET) 7-19 GM/118ML enema 1 enema  1 enema Rectal Once PRN Poggi, Excell Seltzer, MD         Discharge Medications: Please see discharge summary for a list of discharge medications.  Relevant Imaging Results:  Relevant Lab Results:   Additional Information SS #: [id number]  Deniesha Stenglein E Jessia Kief, LCSW

## 2023-01-25 NOTE — Evaluation (Addendum)
Physical Therapy Evaluation Patient Details Name: Marilyn Romero MRN: [id number] DOB: [birth year] Today's Date: 01/25/2023  History of Present Illness  Pt is an 89 y/o F who presents s/p hip reduction on 6/27. PMH of HTN, OA, HLD, falls, GERD, colon CA, and multiple hip dislocations in past 6 months.  Clinical Impression  Pt is in bed, telesitter present, oriented to self and place. Pt reports increased difficulty using RUE and is unable to actively flex R shoulder as needed, pt reports injury at R wrist. PTA pt was independent with mobility and states she did not need help with ADL's. Pt was able to perform bed mobility c/ CGA, transfers and ambulation c/ CGA and RW with cues for AD navigation. Pt ambulated ~158ft, did not report any pain with mobility. Pt demonstrates needs for cueing for AD and pt unable to accurately gauge fatigue while ambulating. PT educated pt on WB precautions, using brace, and not getting up without assistance. Pt left in chair, chair belt placed, needs within reach. Pt would benefit from skilled therapy interventions to improve mobility with DME, educate on safe behaviors, and maximize independence. Pt needs frequent cueing for hand placement for safe transfers and cueing for AD and pt unable to accurately gauge fatigue while ambulating, needs prompting from PT to take a break.      Recommendations for follow up therapy are one component of a multi-disciplinary discharge planning process, led by the attending physician.  Recommendations may be updated based on patient status, additional functional criteria and insurance authorization.  Follow Up Recommendations Can patient physically be transported by private vehicle: Yes     Assistance Recommended at Discharge Frequent or constant Supervision/Assistance  Patient can return home with the following  A lot of help with walking and/or transfers;A lot of help with bathing/dressing/bathroom;Assistance with  cooking/housework;Assist for transportation;Help with stairs or ramp for entrance;Direct supervision/assist for medications management    Equipment Recommendations Other (comment) (TBD next venue of care)  Recommendations for Other Services       Functional Status Assessment Patient has had a recent decline in their functional status and demonstrates the ability to make significant improvements in function in a reasonable and predictable amount of time.     Precautions / Restrictions Precautions Precautions: Fall Required Braces or Orthoses: Knee Immobilizer - Right Knee Immobilizer - Right: On at all times Restrictions Weight Bearing Restrictions: Yes RLE Weight Bearing: Weight bearing as tolerated Other Position/Activity Restrictions: KI locked at 20 degrees of flexion      Mobility  Bed Mobility Overal bed mobility: Needs Assistance Bed Mobility: Supine to Sit     Supine to sit: Min guard, HOB elevated          Transfers Overall transfer level: Needs assistance Equipment used: Rolling walker (2 wheels) Transfers: Sit to/from Stand Sit to Stand: Min guard                Ambulation/Gait Ambulation/Gait assistance: Min guard Gait Distance (Feet): 100 Feet Assistive device: Rolling walker (2 wheels) Gait Pattern/deviations: Step-through pattern, Drifts right/left       General Gait Details: notable gait deviation to left when fatigued  Stairs            Wheelchair Mobility    Modified Rankin (Stroke Patients Only)       Balance Overall balance assessment: Needs assistance Sitting-balance support: Feet supported Sitting balance-Leahy Scale: Fair     Standing balance support: Bilateral upper extremity supported Standing balance-Leahy Scale: Fair  Pertinent Vitals/Pain Pain Assessment Pain Assessment: No/denies pain    Home Living Family/patient expects to be discharged to:: Skilled nursing  facility                        Prior Function Prior Level of Function : Needs assist;Patient poor historian/Family not available;History of Falls (last six months)             Mobility Comments: Pt reports being independent with mobility       Hand Dominance        Extremity/Trunk Assessment   Upper Extremity Assessment Upper Extremity Assessment: Generalized weakness    Lower Extremity Assessment Lower Extremity Assessment: Generalized weakness       Communication   Communication: HOH  Cognition Arousal/Alertness: Awake/alert Behavior During Therapy: WFL for tasks assessed/performed Overall Cognitive Status: No family/caregiver present to determine baseline cognitive functioning                                          General Comments      Exercises Other Exercises Other Exercises: Educated pt on WB precautions and brace   Assessment/Plan    PT Assessment Patient needs continued PT services  PT Problem List Decreased strength;Decreased cognition;Decreased knowledge of use of DME;Decreased range of motion;Decreased activity tolerance;Decreased safety awareness;Decreased balance;Decreased knowledge of precautions;Decreased mobility;Decreased coordination       PT Treatment Interventions DME instruction;Balance training;Gait training;Neuromuscular re-education;Stair training;Functional mobility training;Patient/family education;Therapeutic activities;Therapeutic exercise    PT Goals (Current goals can be found in the Care Plan section)  Acute Rehab PT Goals Patient Stated Goal: to return to home PT Goal Formulation: With patient Time For Goal Achievement: 02/08/23 Potential to Achieve Goals: Good    Frequency Min 3X/week     Co-evaluation               AM-PAC PT "6 Clicks" Mobility  Outcome Measure Help needed turning from your back to your side while in a flat bed without using bedrails?: None Help needed moving  from lying on your back to sitting on the side of a flat bed without using bedrails?: None Help needed moving to and from a bed to a chair (including a wheelchair)?: None Help needed standing up from a chair using your arms (e.g., wheelchair or bedside chair)?: None Help needed to walk in hospital room?: A Little Help needed climbing 3-5 steps with a railing? : A Lot 6 Click Score: 21    End of Session Equipment Utilized During Treatment: Gait belt Activity Tolerance: Patient tolerated treatment well Patient left: in chair;with call bell/phone within reach;with chair alarm set;with nursing/sitter in room Nurse Communication: Mobility status PT Visit Diagnosis: Unsteadiness on feet (R26.81);Muscle weakness (generalized) (M62.81);Other abnormalities of gait and mobility (R26.89)    Time: 1610-9604 PT Time Calculation (min) (ACUTE ONLY): 31 min   Charges:   PT Evaluation $PT Eval Low Complexity: 1 Low PT Treatments $Therapeutic Activity: 23-37 mins        Lala Lund, PT, SPT  11:20 AM,01/25/23

## 2023-01-25 NOTE — Plan of Care (Signed)

## 2023-01-26 DIAGNOSIS — E876 Hypokalemia: Secondary | ICD-10-CM | POA: Diagnosis not present

## 2023-01-26 DIAGNOSIS — S73004A Unspecified dislocation of right hip, initial encounter: Secondary | ICD-10-CM | POA: Diagnosis not present

## 2023-01-26 DIAGNOSIS — I10 Essential (primary) hypertension: Secondary | ICD-10-CM | POA: Diagnosis not present

## 2023-01-26 DIAGNOSIS — T84020A Dislocation of internal right hip prosthesis, initial encounter: Secondary | ICD-10-CM | POA: Diagnosis not present

## 2023-01-26 NOTE — TOC Progression Note (Addendum)
Transition of Care [facility]) - Progression Note    Patient Details  Name: Marilyn Romero MRN: [id number] Date of Birth: 22-Jun-1934  Transition of Care [facility] CM/SW Contact  Marilyn Cline, LCSW Phone Number: 01/26/2023, 11:20 AM  Clinical Narrative:    Telesitter was DC last night.  Awaiting bed offers. Patient would need to be sitter free 48 hours for SNF.  CSW updated daughter. She is hoping [facility] will reevaluate on Monday once patient has been sitter free for the weekend.  CSW asked [name] at [facility] if they can reeval Monday, per [name] TOC will need to follow up with Olegario Messier in the [employer] at [place] on Monday as [name] is off this weekend and on vacation next week.    Expected Discharge Plan: Skilled Nursing Facility Barriers to Discharge: Continued Medical Work up  Expected Discharge Plan and Services       Living arrangements for the past 2 months: Assisted Living Facility Expected Discharge Date: 01/25/23                                     Social Determinants of Health (SDOH) Interventions SDOH Screenings   Food Insecurity: Patient Unable To Answer (01/24/2023)  Housing: High Risk (01/24/2023)  Transportation Needs: Patient Unable To Answer (01/24/2023)  Utilities: Patient Unable To Answer (01/24/2023)  Depression (PHQ2-9): Low Risk  (06/27/2022)  Tobacco Use: Low Risk  (01/25/2023)    Readmission Risk Interventions     No data to display

## 2023-01-26 NOTE — Progress Notes (Signed)
  Progress Note   Patient: Marilyn Romero OZH:[id number] DOB: [birth year] DOA: 01/23/2023     0 DOS: the patient was seen and examined on 01/26/2023   Brief hospital course: Marilyn Romero is a 89 y.o. female with PMH significant for HTN, osteoarthritis, lumbar radiculopathy, GERD, impaired mobility, multiple falls and recurrent hip dislocations.  Came to the hospital from long-term care facility with hip pain, was found to have right hip dislocation again. OR closed reduction is performed 6/27.   Principal Problem:   Hip dislocation, right (HCC) Active Problems:   GERD (gastroesophageal reflux disease)   Essential hypertension   Hypokalemia   Acute anemia   Assessment and Plan: Right prosthetic hip dislocation History of recurrent dislocations Sent from nursing facility with complaint of right hip pain.  Did not have a fall.  Apparently she was not supposed to be ambulatory until cleared by orthopedics but she got up by self causing recurrent hip dislocation. X-ray pelvis showed superolateral dislocation of the right hip prosthesis femoral component. Patient has closed reduction performed on 6/27.  Pain much better.    Due to recurrent joint dislocation, patient be followed with orthopedics for future procedure. Patient was ready for discharge medically, but the physical therapy has determined that the patient will need nursing home placement.  TOC is working on placement.   Acute anemia. Patient has slight drop of hemoglobin, no evidence of bleeding.  Could be due to IV fluids with dilution.  Stable.   Hypokalemia. Improved.   Hypertension Continue lisinopril   HLD Continue Lipitor      Subjective:  Patient has no complaint today.  Physical Exam: Vitals:   01/25/23 0937 01/25/23 1605 01/25/23 2336 01/26/23 0759  BP: 135/72 136/71 126/62 128/65  Pulse: (!) 103 74 72 72  Resp: 16 16 18 14   Temp: (!) 97.5 F (36.4 C) 97.9 F (36.6 C) 99 F (37.2 C) 98 F (36.7 C)   TempSrc:      SpO2: 95% 99% 98% 95%  Weight:      Height:       General exam: Appears calm and comfortable  Respiratory system: Clear to auscultation. Respiratory effort normal. Cardiovascular system: S1 & S2 heard, RRR. No JVD, murmurs, rubs, gallops or clicks. No pedal edema. Gastrointestinal system: Abdomen is nondistended, soft and nontender. No organomegaly or masses felt. Normal bowel sounds heard. Central nervous system: Alert and oriented x2. No focal neurological deficits. Extremities: Symmetric 5 x 5 power. Skin: No rashes, lesions or ulcers Psychiatry:  Mood & affect appropriate.    Data Reviewed:  There are no new results to review at this time.  Family Communication: Daughter updated at bedside.  Disposition: Status is: Observation      Time spent: 35 minutes  Author: Marrion Coy, MD 01/26/2023 10:30 AM  For on call review [email or web address].

## 2023-01-26 NOTE — Plan of Care (Signed)
  Problem: Activity: Goal: Risk for activity intolerance will decrease Outcome: Progressing   Problem: Pain Managment: Goal: General experience of comfort will improve Outcome: Progressing   Problem: Safety: Goal: Ability to remain free from injury will improve Outcome: Progressing   

## 2023-01-26 NOTE — Plan of Care (Signed)

## 2023-01-26 NOTE — Evaluation (Signed)
Occupational Therapy Evaluation Patient Details Name: Marilyn Romero MRN: [id number] DOB: 1934-04-08 Today's Date: 01/26/2023   History of Present Illness Pt is an 89 y/o F who presents s/p hip reduction on 6/27. PMH of HTN, OA, HLD, falls, GERD, colon CA, and multiple hip dislocations in past 6 months.   Clinical Impression   Chart reviewed, pt greeted in bed, agreeable to OT evaluation. Pt is oriented to self only, follows directions with increased time. Frequent vcs required throughout for safety and technique. PTA pt endorses she lives in an ALF, performs ADLs generally with MOD I, assist with IADLs, amb with a RW and has a fall history. Pt is a ?historian. Pt presents with deficits in cognition, strength, endurance, activity tolerance, balance all affecting safe and optimal ADL completion. Noted RUE deficits which patient reports are baseline/from a previous injury. Team notified. Pt is left in bedside chair, safety maintained, all needs met. Pt will benefit from post acute OT to address functional deficits. OT will follow acutely.      Recommendations for follow up therapy are one component of a multi-disciplinary discharge planning process, led by the attending physician.  Recommendations may be updated based on patient status, additional functional criteria and insurance authorization.   Assistance Recommended at Discharge Frequent or constant Supervision/Assistance  Patient can return home with the following A little help with walking and/or transfers;A lot of help with walking and/or transfers;Direct supervision/assist for financial management;Direct supervision/assist for medications management;Assistance with cooking/housework    Functional Status Assessment  Patient has had a recent decline in their functional status and demonstrates the ability to make significant improvements in function in a reasonable and predictable amount of time.  Equipment Recommendations  BSC/3in1;Tub/shower  seat    Recommendations for Other Services       Precautions / Restrictions Precautions Precautions: Fall Required Braces or Orthoses: Knee Immobilizer - Right Knee Immobilizer - Right: On at all times Restrictions Weight Bearing Restrictions: Yes RLE Weight Bearing: Weight bearing as tolerated Other Position/Activity Restrictions: KI locked at 20 degrees of flexion      Mobility Bed Mobility Overal bed mobility: Needs Assistance Bed Mobility: Supine to Sit     Supine to sit: Min guard, HOB elevated          Transfers Overall transfer level: Needs assistance Equipment used: Rolling walker (2 wheels) Transfers: Sit to/from Stand Sit to Stand:  (CGA off bed, MIN-MOD A off toilet)           General transfer comment: frequent vcs for safe technique      Balance Overall balance assessment: Needs assistance Sitting-balance support: Feet supported Sitting balance-Leahy Scale: Fair     Standing balance support: Bilateral upper extremity supported Standing balance-Leahy Scale: Fair                             ADL either performed or assessed with clinical judgement   ADL Overall ADL's : Needs assistance/impaired Eating/Feeding: Set up;Sitting   Grooming: Wash/dry hands;Standing;Oral care;Supervision/safety;Min guard Grooming Details (indicate cue type and reason): sink level with RW, frequent vcs for safety         Upper Body Dressing : Minimal assistance;Sitting   Lower Body Dressing: Maximal assistance;Sit to/from stand Lower Body Dressing Details (indicate cue type and reason): donn underwear Toilet Transfer: Minimal assistance;Moderate assistance;Rolling walker (2 wheels);Ambulation Toilet Transfer Details (indicate cue type and reason): MIN A for stand>sit, MOD A for sit>stand off toilet  Toileting- Architect and Hygiene: Min guard;Sit to/from stand Toileting - Clothing Manipulation Details (indicate cue type and reason): after  urinating on toilet     Functional mobility during ADLs: Min guard;Cueing for safety;Rolling walker (2 wheels) (approx 20' 2 attempts in room)       Vision Patient Visual Report: No change from baseline       Perception     Praxis      Pertinent Vitals/Pain Pain Assessment Pain Assessment: No/denies pain     Hand Dominance     Extremity/Trunk Assessment Upper Extremity Assessment Upper Extremity Assessment: Generalized weakness (pt unable to flex shoulder, weakness throughout elbow and wrist; pt reports this is from a pervious broken bone however team notified)   Lower Extremity Assessment Lower Extremity Assessment: Generalized weakness       Communication Communication Communication: HOH   Cognition Arousal/Alertness: Awake/alert Behavior During Therapy: Flat affect Overall Cognitive Status: No family/caregiver present to determine baseline cognitive functioning Area of Impairment: Attention, Memory, Following commands, Safety/judgement, Awareness, Orientation                 Orientation Level: Disoriented to, Place, Time, Situation ("hospital" after provided 3 options) Current Attention Level: Sustained Memory: Decreased recall of precautions, Decreased short-term memory Following Commands: Follows one step commands with increased time Safety/Judgement: Decreased awareness of safety, Decreased awareness of deficits Awareness: Emergent         General Comments  vss throughout, KI fit adjusted as mild bruising noted on lateral side of R knee    Exercises Other Exercises Other Exercises: edu re: role of OT, role of rehab, precautions, KI use   Shoulder Instructions      Home Living Family/patient expects to be discharged to::  ([facility] per chart review, pt confirms)                                 Additional Comments: pt reports she has an apt, has a walk in shower where she stands, uses a RW to amb; pt is a ?historian      Prior  Functioning/Environment Prior Level of Function : Needs assist;Patient poor historian/Family not available;History of Falls (last six months)             Mobility Comments: pt reports MOD I with RW, frequent fall history ADLs Comments: pt reports she is MOD I with ADL, assist for IADL, pt is a ?historian        OT Problem List: Decreased strength;Decreased activity tolerance;Decreased knowledge of use of DME or AE;Impaired vision/perception;Decreased knowledge of precautions      OT Treatment/Interventions: Self-care/ADL training;DME and/or AE instruction;Therapeutic activities;Balance training;Therapeutic exercise;Patient/family education    OT Goals(Current goals can be found in the care plan section) Acute Rehab OT Goals Patient Stated Goal: feel better OT Goal Formulation: With patient Time For Goal Achievement: 02/09/23 Potential to Achieve Goals: Good ADL Goals Pt Will Perform Grooming: sitting;standing;with modified independence Pt Will Perform Lower Body Dressing: with modified independence;sit to/from stand Pt Will Transfer to Toilet: with modified independence;ambulating Pt Will Perform Toileting - Clothing Manipulation and hygiene: with modified independence;sit to/from stand  OT Frequency: Min 2X/week    Co-evaluation              AM-PAC OT "6 Clicks" Daily Activity     Outcome Measure Help from another person eating meals?: None Help from another person taking care of personal grooming?: A  Little Help from another person toileting, which includes using toliet, bedpan, or urinal?: A Little Help from another person bathing (including washing, rinsing, drying)?: A Lot Help from another person to put on and taking off regular upper body clothing?: A Little Help from another person to put on and taking off regular lower body clothing?: A Lot 6 Click Score: 17   End of Session Equipment Utilized During Treatment: Gait belt;Rolling walker (2 wheels) Nurse  Communication: Mobility status  Activity Tolerance: Patient tolerated treatment well Patient left: in chair;with call bell/phone within reach;with chair alarm set (posey chair belt,NT in room)  OT Visit Diagnosis: Unsteadiness on feet (R26.81);Other abnormalities of gait and mobility (R26.89)                Time: 4098-1191 OT Time Calculation (min): 35 min Charges:  OT General Charges $OT Visit: 1 Visit OT Evaluation $OT Eval Moderate Complexity: 1 Mod  Oleta Mouse, OTD OTR/L  01/26/23, 12:44 PM

## 2023-01-27 DIAGNOSIS — T84020A Dislocation of internal right hip prosthesis, initial encounter: Secondary | ICD-10-CM | POA: Diagnosis not present

## 2023-01-27 DIAGNOSIS — S73004A Unspecified dislocation of right hip, initial encounter: Secondary | ICD-10-CM | POA: Diagnosis not present

## 2023-01-27 DIAGNOSIS — I10 Essential (primary) hypertension: Secondary | ICD-10-CM | POA: Diagnosis not present

## 2023-01-27 NOTE — Progress Notes (Signed)
  Progress Note   Patient: Marilyn Romero:[id number] DOB: July 24, 1934 DOA: 01/23/2023     0 DOS: the patient was seen and examined on 01/27/2023   Brief hospital course: Marilyn Romero is a 89 y.o. female with PMH significant for HTN, osteoarthritis, lumbar radiculopathy, GERD, impaired mobility, multiple falls and recurrent hip dislocations.  Came to the hospital from long-term care facility with hip pain, was found to have right hip dislocation again. OR closed reduction is performed 6/27. Patient was ready for discharge, but required nursing placement per PT.   Principal Problem:   Hip dislocation, right (HCC) Active Problems:   GERD (gastroesophageal reflux disease)   Essential hypertension   Hypokalemia   Acute anemia   Assessment and Plan:  Right prosthetic hip dislocation History of recurrent dislocations Sent from nursing facility with complaint of right hip pain.  Did not have a fall.  Apparently she was not supposed to be ambulatory until cleared by orthopedics but she got up by self causing recurrent hip dislocation. X-ray pelvis showed superolateral dislocation of the right hip prosthesis femoral component. Patient has closed reduction performed on 6/27.  Pain much better.    Due to recurrent joint dislocation, patient be followed with orthopedics for future procedure. Patient was ready for discharge medically, but the physical therapy has determined that the patient will need nursing home placement.  TOC is working on placement.   Acute anemia. Patient has slight drop of hemoglobin, no evidence of bleeding.  Could be due to IV fluids with dilution.  Stable.   Hypokalemia. Improved.   Hypertension Continue lisinopril   HLD Continue Lipitor   Patient has no new issue today, no change in medication plan.   Subjective: She has no complaint today.  Physical Exam: Vitals:   01/26/23 0759 01/26/23 1531 01/26/23 2303 01/27/23 0816  BP: 128/65 125/63 124/66 139/69   Pulse: 72 74 69 71  Resp: 14 14 20 14   Temp: 98 F (36.7 C) 98.2 F (36.8 C) 97.9 F (36.6 C) 97.9 F (36.6 C)  TempSrc:      SpO2: 95% 97% 97% 97%  Weight:      Height:       General exam: Appears calm and comfortable  Respiratory system: Clear to auscultation. Respiratory effort normal. Cardiovascular system: S1 & S2 heard, RRR. No JVD, murmurs, rubs, gallops or clicks. No pedal edema. Gastrointestinal system: Abdomen is nondistended, soft and nontender. No organomegaly or masses felt. Normal bowel sounds heard. Central nervous system: Alert and oriented x2.  No focal neurological deficits. Extremities: Symmetric 5 x 5 power. Skin: No rashes, lesions or ulcers Psychiatry: Judgement and insight appear normal. Mood & affect appropriate.    Data Reviewed:  There are no new results to review at this time.  Family Communication: None  Disposition: Status is: Observation      Time spent: 25 minutes  Author: Marrion Coy, MD 01/27/2023 12:41 PM  For on call review [email or web address].

## 2023-01-27 NOTE — Plan of Care (Signed)
  Problem: Education: Goal: Knowledge of General Education information will improve Description Including pain rating scale, medication(s)/side effects and non-pharmacologic comfort measures Outcome: Progressing   

## 2023-01-28 DIAGNOSIS — I10 Essential (primary) hypertension: Secondary | ICD-10-CM | POA: Diagnosis not present

## 2023-01-28 DIAGNOSIS — T84020A Dislocation of internal right hip prosthesis, initial encounter: Secondary | ICD-10-CM | POA: Diagnosis not present

## 2023-01-28 DIAGNOSIS — S73004D Unspecified dislocation of right hip, subsequent encounter: Secondary | ICD-10-CM

## 2023-01-28 NOTE — Progress Notes (Signed)
  Progress Note   Patient: Marilyn Romero QQV:[id number] DOB: 05-12-1934 DOA: 01/23/2023     0 DOS: the patient was seen and examined on 01/28/2023   Brief hospital course: Marilyn Romero is a 89 y.o. female with PMH significant for HTN, osteoarthritis, lumbar radiculopathy, GERD, impaired mobility, multiple falls and recurrent hip dislocations.  Came to the hospital from long-term care facility with hip pain, was found to have right hip dislocation again. OR closed reduction is performed 6/27. Patient was ready for discharge, but required nursing placement per PT.   Principal Problem:   Hip dislocation, right (HCC) Active Problems:   GERD (gastroesophageal reflux disease)   Essential hypertension   Hypokalemia   Acute anemia   Assessment and Plan: Right prosthetic hip dislocation History of recurrent dislocations Sent from nursing facility with complaint of right hip pain.  Did not have a fall.  Apparently she was not supposed to be ambulatory until cleared by orthopedics but she got up by self causing recurrent hip dislocation. X-ray pelvis showed superolateral dislocation of the right hip prosthesis femoral component. Patient has closed reduction performed on 6/27.  Pain much better.    Due to recurrent joint dislocation, patient be followed with orthopedics for future procedure. Patient was ready for discharge medically, but the physical therapy has determined that the patient will need nursing home placement.  TOC is working on placement.   Acute anemia. Patient has slight drop of hemoglobin, no evidence of bleeding.  Could be due to IV fluids with dilution.  Stable.   Hypokalemia. Improved.   Hypertension Continue lisinopril   HLD Continue Lipitor     Patient doing well, slept well, no change in treatment plan. Discussed with TOC, patient still pending insurance authorization for nursing placement.    Subjective: No complaint.  Physical Exam: Vitals:   01/27/23 0816  01/27/23 1533 01/28/23 0012 01/28/23 0723  BP: 139/69 (!) 105/53 137/72 (!) 147/76  Pulse: 71 71 72 73  Resp: 14 14 16 16   Temp: 97.9 F (36.6 C) (!) 97.4 F (36.3 C) 98.1 F (36.7 C) 98.1 F (36.7 C)  TempSrc:      SpO2: 97% 97% 97% 96%  Weight:      Height:       General exam: Appears calm and comfortable  Respiratory system: Clear to auscultation. Respiratory effort normal. Cardiovascular system: S1 & S2 heard, RRR. No JVD, murmurs, rubs, gallops or clicks. No pedal edema. Gastrointestinal system: Abdomen is nondistended, soft and nontender. No organomegaly or masses felt. Normal bowel sounds heard. Central nervous system: Alert and oriented x2.  No focal neurological deficits. Extremities: Symmetric 5 x 5 power. Skin: No rashes, lesions or ulcers Psychiatry: Judgement and insight appear normal. Mood & affect appropriate.    Data Reviewed:  There are no new results to review at this time.  Family Communication: None  Disposition: Status is: Observation      Time spent: 25 minutes  Author: Marrion Coy, MD 01/28/2023 10:42 AM  For on call review [email or web address].

## 2023-01-28 NOTE — TOC Progression Note (Addendum)
Transition of Care [facility]) - Progression Note    Patient Details  Name: Marilyn Romero MRN: [id number] Date of Birth: 08-Jun-1934  Transition of Care [facility]) CM/SW Contact  Marlowe Sax, RN Phone Number: 01/28/2023, 11:35 AM  Clinical Narrative:     Reached out to [place] with [facility], [name] is on Vacation this week She is checking to see if they were able to make a bed offer for the patient They are bot able to accept the patient at this time No other bed offers, resent the bedseach and expanded  Called Daughter [name] and let her know that [facility] is not able to make a bed offer and I will reach out once we get any bed offers, she stated undetstanding  Expected Discharge Plan: Skilled Nursing Facility Barriers to Discharge: Continued Medical Work up  Expected Discharge Plan and Services       Living arrangements for the past 2 months: Assisted Living Facility Expected Discharge Date: 01/25/23                                     Social Determinants of Health (SDOH) Interventions SDOH Screenings   Food Insecurity: Patient Unable To Answer (01/24/2023)  Housing: High Risk (01/24/2023)  Transportation Needs: Patient Unable To Answer (01/24/2023)  Utilities: Patient Unable To Answer (01/24/2023)  Depression (PHQ2-9): Low Risk  (06/27/2022)  Tobacco Use: Low Risk  (01/25/2023)    Readmission Risk Interventions     No data to display

## 2023-01-28 NOTE — Progress Notes (Signed)
Occupational Therapy Treatment Patient Details Name: Marilyn Romero MRN: [id number] DOB: [birth year] Today's Date: 01/28/2023   History of present illness Pt is an 89 y/o F who presents s/p hip reduction on 6/27. PMH of HTN, OA, HLD, falls, GERD, colon CA, and multiple hip dislocations in past 6 months.   OT comments  Marilyn Romero was seen for OT treatment on this date. Upon arrival to room pt reclined in bed, agreeable to tx. Pt requires MIN A + RW toilet t/f - cues to navigate safely around objects in room. MAX A pericare standing. SETUP standing grooming tasks. Pt oriented to self only, states we are in [place]. Pt making good progress toward goals, will continue to follow POC. Discharge recommendation remains appropriate.     Recommendations for follow up therapy are one component of a multi-disciplinary discharge planning process, led by the attending physician.  Recommendations may be updated based on patient status, additional functional criteria and insurance authorization.    Assistance Recommended at Discharge Frequent or constant Supervision/Assistance  Patient can return home with the following  A little help with walking and/or transfers;A lot of help with walking and/or transfers;Direct supervision/assist for financial management;Direct supervision/assist for medications management;Assistance with cooking/housework   Equipment Recommendations  BSC/3in1;Tub/shower seat    Recommendations for Other Services      Precautions / Restrictions Precautions Precautions: Fall Required Braces or Orthoses: Knee Immobilizer - Right Knee Immobilizer - Right: On at all times Restrictions Weight Bearing Restrictions: Yes RLE Weight Bearing: Weight bearing as tolerated Other Position/Activity Restrictions: KI locked at 20 degrees of flexion       Mobility Bed Mobility Overal bed mobility: Needs Assistance Bed Mobility: Supine to Sit     Supine to sit: Supervision           Transfers Overall transfer level: Needs assistance Equipment used: Rolling walker (2 wheels) Transfers: Sit to/from Stand Sit to Stand: Min assist                 Balance Overall balance assessment: Needs assistance Sitting-balance support: Feet supported Sitting balance-Leahy Scale: Fair     Standing balance support: No upper extremity supported, During functional activity Standing balance-Leahy Scale: Fair                             ADL either performed or assessed with clinical judgement   ADL Overall ADL's : Needs assistance/impaired                                       General ADL Comments: MIN A + RW toilet t/f. MAX A pericare standing. SETUP standing grooming tasks    Extremity/Trunk Assessment Upper Extremity Assessment Upper Extremity Assessment: RUE deficits/detail RUE Deficits / Details: states hx of R wrist injury, unable to state what type or how long; reports at baseline             Cognition Arousal/Alertness: Awake/alert Behavior During Therapy: Flat affect Overall Cognitive Status: No family/caregiver present to determine baseline cognitive functioning Area of Impairment: Orientation, Memory, Following commands                 Orientation Level: Disoriented to, Time, Place   Memory: Decreased recall of precautions, Decreased short-term memory Following Commands: Follows one step commands with increased time Safety/Judgement: Decreased awareness of safety, Decreased awareness of deficits  Pertinent Vitals/ Pain       Pain Assessment Pain Assessment: Faces Faces Pain Scale: Hurts a little bit Pain Location: R wrist Pain Descriptors / Indicators: Discomfort Pain Intervention(s): Limited activity within patient's tolerance, Repositioned   Frequency  Min 2X/week        Progress Toward Goals  OT Goals(current goals can now be found in the care plan section)   Progress towards OT goals: Progressing toward goals  Acute Rehab OT Goals Patient Stated Goal: to go home OT Goal Formulation: With patient Time For Goal Achievement: 02/09/23 Potential to Achieve Goals: Good ADL Goals Pt Will Perform Grooming: sitting;standing;with modified independence Pt Will Perform Lower Body Dressing: with modified independence;sit to/from stand Pt Will Transfer to Toilet: with modified independence;ambulating Pt Will Perform Toileting - Clothing Manipulation and hygiene: with modified independence;sit to/from stand  Plan Discharge plan remains appropriate;Frequency remains appropriate    Co-evaluation                 AM-PAC OT "6 Clicks" Daily Activity     Outcome Measure   Help from another person eating meals?: None Help from another person taking care of personal grooming?: A Little Help from another person toileting, which includes using toliet, bedpan, or urinal?: A Little Help from another person bathing (including washing, rinsing, drying)?: A Lot Help from another person to put on and taking off regular upper body clothing?: A Little Help from another person to put on and taking off regular lower body clothing?: A Lot 6 Click Score: 17    End of Session    OT Visit Diagnosis: Unsteadiness on feet (R26.81);Other abnormalities of gait and mobility (R26.89)   Activity Tolerance Patient tolerated treatment well   Patient Left in chair;with call bell/phone within reach;with chair alarm set   Nurse Communication          Time: 1610-9604 OT Time Calculation (min): 16 min  Charges: OT General Charges $OT Visit: 1 Visit OT Treatments $Self Care/Home Management : 8-22 mins  Kathie Dike, M.S. OTR/L  01/28/23, 12:15 PM  ascom [phone]

## 2023-01-28 NOTE — Progress Notes (Signed)
Physical Therapy Treatment Patient Details Name: Marilyn Romero MRN: [id number] DOB: 04-30-1934 Today's Date: 01/28/2023   History of Present Illness Pt is an 89 y/o F who presents s/p hip reduction on 6/27. PMH of HTN, OA, HLD, falls, GERD, colon CA, and multiple hip dislocations in past 6 months.    PT Comments    Pt in recliner, confused thinking she is in [place] and does not know why she is here.  She is able to stand with min/mod a x 1 with vc's for hand placements.  Once up, she is able to walk 150' with RW and min assist for safety, navigation and balance deficits.  She does do some supine ex in chair but stated she is tired and session is stopped. Tech stated she has been up frequently today.  KI is on and left on during session per orders.   Recommendations for follow up therapy are one component of a multi-disciplinary discharge planning process, led by the attending physician.  Recommendations may be updated based on patient status, additional functional criteria and insurance authorization.  Follow Up Recommendations       Assistance Recommended at Discharge Frequent or constant Supervision/Assistance  Patient can return home with the following Assistance with cooking/housework;Assist for transportation;Help with stairs or ramp for entrance;Direct supervision/assist for medications management;A little help with walking and/or transfers;A little help with bathing/dressing/bathroom   Equipment Recommendations       Recommendations for Other Services       Precautions / Restrictions Precautions Precautions: Fall Required Braces or Orthoses: Knee Immobilizer - Right Knee Immobilizer - Right: On at all times Restrictions Weight Bearing Restrictions: Yes RLE Weight Bearing: Weight bearing as tolerated Other Position/Activity Restrictions: KI locked at 20 degrees of flexion     Mobility  Bed Mobility               General bed mobility comments: in recliner before and  after session    Transfers Overall transfer level: Needs assistance Equipment used: Rolling walker (2 wheels) Transfers: Sit to/from Stand Sit to Stand: Min assist, Mod assist                Ambulation/Gait Ambulation/Gait assistance: Min guard, Min assist Gait Distance (Feet): 150 Feet Assistive device: Rolling walker (2 wheels) Gait Pattern/deviations: Step-through pattern Gait velocity: decreased     General Gait Details: increased distance, assist for direction, balance and general safety   Stairs             Wheelchair Mobility    Modified Rankin (Stroke Patients Only)       Balance Overall balance assessment: Needs assistance Sitting-balance support: Feet supported Sitting balance-Leahy Scale: Fair     Standing balance support: Bilateral upper extremity supported Standing balance-Leahy Scale: Fair                              Cognition Arousal/Alertness: Awake/alert Behavior During Therapy: Flat affect Overall Cognitive Status: No family/caregiver present to determine baseline cognitive functioning                                 General Comments: confused.  does not know why she is here and thinks she is in [place]        Exercises Other Exercises Other Exercises: some supine A/AAROM liited by general fatigue    General Comments  Pertinent Vitals/Pain Pain Assessment Pain Assessment: No/denies pain    Home Living                          Prior Function            PT Goals (current goals can now be found in the care plan section) Progress towards PT goals: Progressing toward goals    Frequency    Min 3X/week      PT Plan Current plan remains appropriate    Co-evaluation              AM-PAC PT "6 Clicks" Mobility   Outcome Measure  Help needed turning from your back to your side while in a flat bed without using bedrails?: None Help needed moving from lying on your  back to sitting on the side of a flat bed without using bedrails?: None Help needed moving to and from a bed to a chair (including a wheelchair)?: A Little Help needed standing up from a chair using your arms (e.g., wheelchair or bedside chair)?: A Little Help needed to walk in hospital room?: A Little Help needed climbing 3-5 steps with a railing? : A Lot 6 Click Score: 19    End of Session Equipment Utilized During Treatment: Gait belt Activity Tolerance: Patient limited by fatigue Patient left: in chair;with call bell/phone within reach;with chair alarm set Nurse Communication: Mobility status PT Visit Diagnosis: Unsteadiness on feet (R26.81);Muscle weakness (generalized) (M62.81);Other abnormalities of gait and mobility (R26.89)     Time: 1152-1202 PT Time Calculation (min) (ACUTE ONLY): 10 min  Charges:  $Gait Training: 8-22 mins                   Danielle Dess, PTA 01/28/23, 12:12 PM

## 2023-01-29 DIAGNOSIS — E876 Hypokalemia: Secondary | ICD-10-CM | POA: Diagnosis not present

## 2023-01-29 DIAGNOSIS — K219 Gastro-esophageal reflux disease without esophagitis: Secondary | ICD-10-CM

## 2023-01-29 DIAGNOSIS — S73004D Unspecified dislocation of right hip, subsequent encounter: Secondary | ICD-10-CM | POA: Diagnosis not present

## 2023-01-29 DIAGNOSIS — T84020A Dislocation of internal right hip prosthesis, initial encounter: Secondary | ICD-10-CM | POA: Diagnosis not present

## 2023-01-29 DIAGNOSIS — I10 Essential (primary) hypertension: Secondary | ICD-10-CM | POA: Diagnosis not present

## 2023-01-29 MED ORDER — HYDROCODONE-ACETAMINOPHEN 5-325 MG PO TABS
1.0000 | ORAL_TABLET | Freq: Four times a day (QID) | ORAL | Status: DC | PRN
  Administered 2023-01-29: 1 via ORAL
  Filled 2023-01-29: qty 1

## 2023-01-29 NOTE — TOC Progression Note (Signed)
Transition of Care [facility]) - Progression Note    Patient Details  Name: Marilyn Romero MRN: [id number] Date of Birth: April 20, 1934  [facility]) CM/SW Contact  Marlowe Sax, RN Phone Number: 01/29/2023, 11:33 AM  Clinical Narrative:    Daughter [name] called and chose [facility], I notified Revonda Standard with [facility] Ins pending   Expected Discharge Plan: Skilled Nursing Facility Barriers to Discharge: Continued Medical Work up  Expected Discharge Plan and Services       Living arrangements for the past 2 months: Assisted Living Facility Expected Discharge Date: 01/25/23                                     Social Determinants of Health (SDOH) Interventions SDOH Screenings   Food Insecurity: Patient Unable To Answer (01/24/2023)  Housing: High Risk (01/24/2023)  Transportation Needs: Patient Unable To Answer (01/24/2023)  Utilities: Patient Unable To Answer (01/24/2023)  Depression (PHQ2-9): Low Risk  (06/27/2022)  Tobacco Use: Low Risk  (01/25/2023)    Readmission Risk Interventions     No data to display

## 2023-01-29 NOTE — Plan of Care (Signed)

## 2023-01-29 NOTE — Progress Notes (Signed)
  Progress Note   Patient: Marilyn Romero ZOX:[id number] DOB: 1934-04-13 DOA: 01/23/2023     0 DOS: the patient was seen and examined on 01/29/2023   Brief hospital course: Marilyn Romero is a 89 y.o. female with PMH significant for HTN, osteoarthritis, lumbar radiculopathy, GERD, impaired mobility, multiple falls and recurrent hip dislocations.  Came to the hospital from long-term care facility with hip pain, was found to have right hip dislocation again. OR closed reduction is performed 6/27. Patient was ready for discharge, but required nursing placement per PT.  7/2: Palliative care consult, cutting back dose of narcotics  Principal Problem:   Hip dislocation, right (HCC) Active Problems:   GERD (gastroesophageal reflux disease)   Essential hypertension   Hypokalemia   Acute anemia   Assessment and Plan: Right prosthetic hip dislocation History of recurrent dislocations Sent from nursing facility with complaint of right hip pain.  Did not have a fall.  Apparently she was not supposed to be ambulatory until cleared by orthopedics but she got up by self causing recurrent hip dislocation. X-ray pelvis showed superolateral dislocation of the right hip prosthesis femoral component. Patient has closed reduction performed on 6/27.  Pain much better.    Due to recurrent joint dislocation, patient be followed with orthopedics for future procedure. Patient is ready for discharge medically, but per therapy assessment -needs SNF, TOC is working on placement.   Acute anemia. Patient has slight drop of hemoglobin, no evidence of bleeding.  Could be due to IV fluids with dilution.  Stable.   Hypokalemia. Improved.   Hypertension Continue lisinopril   HLD Continue Lipitor     Patient doing well, slept well, no change in treatment plan. Discussed with TOC, patient still pending insurance authorization for nursing placement.    Subjective: No new complaints  Physical Exam: Vitals:    01/28/23 1630 01/28/23 1656 01/28/23 2345 01/29/23 0744  BP: (!) 168/74 (!) 147/67 (!) 145/75 139/72  Pulse: 75 78 85 70  Resp: 17  18 16   Temp: 98.1 F (36.7 C)  98.3 F (36.8 C) 97.7 F (36.5 C)  TempSrc:      SpO2: 97% 99% 98% 97%  Weight:      Height:       General exam: Appears calm and comfortable  Respiratory system: Clear to auscultation. Respiratory effort normal. Cardiovascular system: S1 & S2 heard, RRR. No JVD, murmurs, rubs, gallops or clicks. No pedal edema. Gastrointestinal system: Abdomen is nondistended, soft and nontender. No organomegaly or masses felt. Normal bowel sounds heard. Central nervous system: Alert and oriented x2.  No focal neurological deficits. Extremities: Symmetric 5 x 5 power. Skin: No rashes, lesions or ulcers Psychiatry: Judgement and insight appear normal. Mood & affect appropriate.    Data Reviewed:  There are no new results to review at this time.  Family Communication: None  Disposition: Status is: Observation     DVT prophylaxis-Lovenox Time spent: 25 minutes  Author: Delfino Lovett, MD 01/29/2023 2:50 PM  For on call review [email or web address].

## 2023-01-29 NOTE — TOC Progression Note (Signed)
Transition of Care [facility]) - Progression Note    Patient Details  Name: Marilyn Romero MRN: [id number] Date of Birth: 10/16/1933  Transition of [facility]) CM/SW Contact  Marlowe Sax, RN Phone Number: 01/29/2023, 11:22 AM  Clinical Narrative:    Met with the patient and reviewed the bed offers with her, She requested that I call her daughter [name] I called and spoke to [name] and reviewed the bed offers she will review the offers and then call me to let me know, I explained then I will get Ins approval, she will transport via EMS     Expected Discharge Plan: Skilled Nursing Facility Barriers to Discharge: Continued Medical Work up  Expected Discharge Plan and Services       Living arrangements for the past 2 months: Assisted Living Facility Expected Discharge Date: 01/25/23                                     Social Determinants of Health (SDOH) Interventions SDOH Screenings   Food Insecurity: Patient Unable To Answer (01/24/2023)  Housing: High Risk (01/24/2023)  Transportation Needs: Patient Unable To Answer (01/24/2023)  Utilities: Patient Unable To Answer (01/24/2023)  Depression (PHQ2-9): Low Risk  (06/27/2022)  Tobacco Use: Low Risk  (01/25/2023)    Readmission Risk Interventions     No data to display

## 2023-01-30 ENCOUNTER — Encounter: Payer: Self-pay | Admitting: Internal Medicine

## 2023-01-30 DIAGNOSIS — E876 Hypokalemia: Secondary | ICD-10-CM | POA: Diagnosis not present

## 2023-01-30 DIAGNOSIS — T84020A Dislocation of internal right hip prosthesis, initial encounter: Secondary | ICD-10-CM | POA: Diagnosis not present

## 2023-01-30 DIAGNOSIS — Z7189 Other specified counseling: Secondary | ICD-10-CM | POA: Diagnosis not present

## 2023-01-30 DIAGNOSIS — S73004D Unspecified dislocation of right hip, subsequent encounter: Secondary | ICD-10-CM | POA: Diagnosis not present

## 2023-01-30 DIAGNOSIS — Z515 Encounter for palliative care: Secondary | ICD-10-CM | POA: Diagnosis not present

## 2023-01-30 DIAGNOSIS — K219 Gastro-esophageal reflux disease without esophagitis: Secondary | ICD-10-CM | POA: Diagnosis not present

## 2023-01-30 DIAGNOSIS — I10 Essential (primary) hypertension: Secondary | ICD-10-CM | POA: Diagnosis not present

## 2023-01-30 MED ORDER — HYDROCODONE-ACETAMINOPHEN 5-325 MG PO TABS: 1.0000 | ORAL_TABLET | Freq: Four times a day (QID) | ORAL | 0 refills | Status: DC | PRN

## 2023-01-30 NOTE — Discharge Summary (Signed)
Physician Discharge Summary   Patient: Marilyn Romero MRN: [id number] DOB: 1934-03-04  Admit date:     01/23/2023  Discharge date: 01/30/23  Discharge Physician: Delfino Lovett   PCP: Leim Fabry, MD   Recommendations at discharge:    F/up with outpt providers as requested  Discharge Diagnoses: Principal Problem:   Hip dislocation, right [facility]) Active Problems:   GERD (gastroesophageal reflux disease)   Essential hypertension   Hypokalemia   Acute anemia  Hospital Course: MALIKIA BERGS is a 89 y.o. female with PMH significant for HTN, osteoarthritis, lumbar radiculopathy, GERD, impaired mobility, multiple falls and recurrent hip dislocations.  Came to the hospital from long-term care facility with hip pain, was found to have right hip dislocation again. OR closed reduction is performed 6/27. Patient was ready for discharge, but required nursing placement per PT.  Assessment and Plan: Right prosthetic hip dislocation History of recurrent dislocations Sent from nursing facility with complaint of right hip pain.  Did not have a fall.  Apparently she was not supposed to be ambulatory until cleared by orthopedics but she got up by self causing recurrent hip dislocation. X-ray pelvis showed superolateral dislocation of the right hip prosthesis femoral component. Patient has closed reduction performed on 6/27.  Pain much better.    Due to recurrent joint dislocation, patient be followed with orthopedics for future procedure. Going to [facility] for now.   Acute on chronic anemia. Patient has slight drop of hemoglobin, no evidence of bleeding.  Could be due to IV fluids with dilution.  Stable.   Hypokalemia. Improved.   Hypertension Continue lisinopril   HLD Continue Lipitor      Consultants: Ortho Procedures performed: Closed hip joint reduction.   Disposition: Skilled nursing facility Diet recommendation:  Discharge Diet Orders (From admission, onward)     Start      Ordered   01/25/23 0000  Diet - low sodium heart healthy        01/25/23 1021           Carb modified diet DISCHARGE MEDICATION: Allergies as of 01/30/2023   No Known Allergies      Medication List     STOP taking these medications    atorvastatin 20 MG tablet Commonly known as: LIPITOR   ciprofloxacin 500 MG tablet Commonly known as: CIPRO   ibuprofen 200 MG tablet Commonly known as: ADVIL   traMADol 50 MG tablet Commonly known as: Ultram       TAKE these medications    acetaminophen 325 MG tablet Commonly known as: TYLENOL Take 650 mg by mouth every 4 (four) hours as needed for headache, mild pain or fever.   Cerovite Senior Tabs Take 1 tablet by mouth daily.   diclofenac Sodium 1 % Gel Commonly known as: VOLTAREN Apply 2 g topically 4 (four) times daily. Apply to right hip.   ferrous sulfate 325 (65 FE) MG tablet Take 325 mg by mouth daily with breakfast.   guaiFENesin 100 MG/5ML liquid Commonly known as: ROBITUSSIN Take 15 mLs by mouth every 6 (six) hours as needed for cough.   HYDROcodone-acetaminophen 5-325 MG tablet Commonly known as: Norco Take 1 tablet by mouth every 6 (six) hours as needed for up to 3 days for moderate pain or severe pain.   Lidocaine Pain Relief 4 % Generic drug: lidocaine Place 1 patch onto the skin daily. Apply to right hip for 12 hours in a 24 hour period.   lisinopril 10 MG tablet Commonly  known as: ZESTRIL Take 10 mg by mouth daily.   magnesium hydroxide 400 MG/5ML suspension Commonly known as: MILK OF MAGNESIA Take 30 mLs by mouth daily as needed for mild constipation.   potassium chloride SA 20 MEQ tablet Commonly known as: KLOR-CON M Take 1 tablet (20 mEq total) by mouth daily for 5 doses.        Contact information for follow-up providers     Reinaldo Berber, MD. Go in 12 day(s).   Specialty: Orthopedic Surgery Why: at 1:45 pm Contact information: [address] [place]  [address] [phone]         Leim Fabry, MD. Go in 17 day(s).   Specialty: Family Medicine Why: at 2:00 pm Contact information: [address] [place] [address] [phone]              Contact information for after-discharge care     Destination     [facility] .   Service: Skilled Nursing Contact information: [address] [place] [address] [phone]                    Discharge Exam: Ceasar Mons Weights   01/23/23 2011  Weight: 57.2 kg   General exam: Appears calm and comfortable  Respiratory system: Clear to auscultation. Respiratory effort normal. Cardiovascular system: S1 & S2 heard, RRR. No JVD, murmurs, rubs, gallops or clicks. No pedal edema. Gastrointestinal system: Abdomen is nondistended, soft and nontender. No organomegaly or masses felt. Normal bowel sounds heard. Central nervous system: Alert and oriented x2.  No focal neurological deficits. Extremities: Symmetric 5 x 5 power. Skin: No rashes, lesions or ulcers Psychiatry: Judgement and insight appear normal. Mood & affect appropriate.   Condition at discharge: fair  The results of significant diagnostics from this hospitalization (including imaging, microbiology, ancillary and laboratory) are listed below for reference.   Imaging Studies: CT HIP RIGHT WO CONTRAST  Result Date: 01/24/2023 CLINICAL DATA:  Surgical planning for right total hip arthroplasty revision. Multiple recent right hip arthroplasty dislocations. EXAM: CT OF THE RIGHT HIP WITHOUT CONTRAST TECHNIQUE: Multidetector CT imaging of the right hip was performed according to the standard protocol. Multiplanar CT image reconstructions were also generated. RADIATION DOSE REDUCTION: This exam was performed according to the departmental dose-optimization program which includes automated exposure control, adjustment of the mA and/or kV according to patient size and/or use of  iterative reconstruction technique. COMPARISON:  Right hip x-rays from same day. CT abdomen pelvis dated Dec 08, 2021. FINDINGS: Bones/Joint/Cartilage Prior right total hip arthroplasty. No evidence of hardware failure or loosening. No fracture or dislocation. No joint effusion. Ligaments Ligaments are suboptimally evaluated by CT. Muscles and Tendons Atrophy of the right piriformis and gluteus medius and minimus muscles. Soft tissue Soft tissue swelling about the right hip. No fluid collection or hematoma. No soft tissue mass. Large stool ball in the rectum. IMPRESSION: 1. Prior right total hip arthroplasty without hardware complication. Electronically Signed   By: Obie Dredge M.D.   On: 01/24/2023 19:32   DG HIP UNILAT WITH PELVIS 2-3 VIEWS RIGHT  Result Date: 01/24/2023 CLINICAL DATA:  Elective surgery.  Closed reduction right hip. EXAM: DG HIP (WITH OR WITHOUT PELVIS) 2-3V RIGHT COMPARISON:  Pelvic radiograph yesterday FINDINGS: Two fluoroscopic spot views of the right hip obtained in the operating room. The previous arthroplasty dislocation has been reduced, the femoral component is seated in the acetabular component. Fluoroscopy time 57 seconds. Dose 6.2 mGy. IMPRESSION:  Intraoperative fluoroscopy during reduction of right hip arthroplasty dislocation. Electronically Signed   By: Narda Rutherford M.D.   On: 01/24/2023 16:08   DG C-Arm 1-60 Min-No Report  Result Date: 01/24/2023 Fluoroscopy was utilized by the requesting physician.  No radiographic interpretation.   DG Pelvis 1-2 Views  Result Date: 01/23/2023 CLINICAL DATA:  Right hip prosthesis with recurrent dislocation. EXAM: PELVIS - 1-2 VIEW COMPARISON:  Similar study 01/17/2023 and subsequent postreduction spot fluoroscopic view. FINDINGS: Generalized osteopenia. Right hip arthroplasty is again noted with superolateral dislocation of the femoral component. No pelvic fracture or diastasis is seen AP. There is mild degenerative arthrosis of  the SI joints, symphysis pubis and left hip. Tubular calcific plaques in the superficial femoral arteries are again shown. Degenerative change of the visualized lower lumbar spine. IMPRESSION: 1. Superolateral dislocation of the right hip prosthesis femoral component. 2. Osteopenia and degenerative change. 3. Peripheral vascular disease. Electronically Signed   By: Almira Bar M.D.   On: 01/23/2023 20:59   DG C-Arm 1-60 Min  Result Date: 01/17/2023 CLINICAL DATA:  Close reduction of right hip EXAM: DG HIP (WITH OR WITHOUT PELVIS) 2-3V RIGHT; DG C-ARM 1-60 MIN COMPARISON:  Film from earlier in the same day. FLUOROSCOPY TIME:  Radiation Exposure Index (as provided by the fluoroscopic device): Not available If the device does not provide the exposure index: Fluoroscopy Time:  2 minutes 18 seconds Number of Acquired Images:  2 FINDINGS: Interval reduction of the femoral component into the acetabular component is noted. IMPRESSION: Status post reduction of right hip prosthesis. Electronically Signed   By: Alcide Clever M.D.   On: 01/17/2023 22:17   DG HIP UNILAT WITH PELVIS 2-3 VIEWS RIGHT  Result Date: 01/17/2023 CLINICAL DATA:  Close reduction of right hip EXAM: DG HIP (WITH OR WITHOUT PELVIS) 2-3V RIGHT; DG C-ARM 1-60 MIN COMPARISON:  Film from earlier in the same day. FLUOROSCOPY TIME:  Radiation Exposure Index (as provided by the fluoroscopic device): Not available If the device does not provide the exposure index: Fluoroscopy Time:  2 minutes 18 seconds Number of Acquired Images:  2 FINDINGS: Interval reduction of the femoral component into the acetabular component is noted. IMPRESSION: Status post reduction of right hip prosthesis. Electronically Signed   By: Alcide Clever M.D.   On: 01/17/2023 22:17   DG Pelvis 1-2 Views  Result Date: 01/17/2023 CLINICAL DATA:  Status post attempted reduction of right hip EXAM: PELVIS - 1-2 VIEW COMPARISON:  Radiographs 01/17/2023 at 10:18 a.m. FINDINGS: No  significant change from earlier today. Superior-lateral dislocated right hip arthroplasty femoral stem prosthesis. Demineralization. No acute fracture. Degenerative changes left hip, SI joints, pubic symphysis, lumbar spine. IMPRESSION: Similar dislocated right hip arthroplasty. Electronically Signed   By: Minerva Fester M.D.   On: 01/17/2023 20:37   DG Hip Unilat W or Wo Pelvis 2-3 Views Right  Result Date: 01/17/2023 CLINICAL DATA:  Pain after fall EXAM: DG HIP (WITH OR WITHOUT PELVIS) 3V RIGHT COMPARISON:  X-ray 01/02/2023 and older FINDINGS: Osteopenia. Right hip arthroplasty again identified with screw fixated acetabular cup and Press-Fit femoral stem with dislocation of the prosthesis. The femoral component is lateral and superior and posterior to the acetabular cup. Slight asymmetric appearance to the femoral stem with proximally 2 mm of lucency between the stem in the adjacent bone. Please correlate for any signs of loosening. Otherwise no fracture or dislocation. Degenerative changes seen of the visualized lumbar spine at the edge of the imaging field. IMPRESSION: Right  hip arthroplasty dislocation again identified. Electronically Signed   By: Karen Kays M.D.   On: 01/17/2023 18:47   DG Pelvis 1-2 Views  Result Date: 01/02/2023 CLINICAL DATA:  Postreduction EXAM: PELVIS-4 consecutive frontal views COMPARISON:  X-ray earlier 01/02/2023 FINDINGS: Initial postreduction attempt continues to have dislocated right hip arthroplasty with the femoral component superior and lateral to the acetabular cup. The acetabular cup is screwed in position. Global osteopenia with scattered mild degenerative changes. Second and third attempt is similar in appearance. Fourth attempt has relocation. No separate underlying fracture. Imaging was obtained to aid in treatment. IMPRESSION: Postreduction. Electronically Signed   By: Karen Kays M.D.   On: 01/02/2023 10:51   DG Pelvis 1-2 Views  Result Date:  01/02/2023 CLINICAL DATA:  Postreduction EXAM: PELVIS-4 consecutive frontal views COMPARISON:  X-ray earlier 01/02/2023 FINDINGS: Initial postreduction attempt continues to have dislocated right hip arthroplasty with the femoral component superior and lateral to the acetabular cup. The acetabular cup is screwed in position. Global osteopenia with scattered mild degenerative changes. Second and third attempt is similar in appearance. Fourth attempt has relocation. No separate underlying fracture. Imaging was obtained to aid in treatment. IMPRESSION: Postreduction. Electronically Signed   By: Karen Kays M.D.   On: 01/02/2023 10:51   DG Pelvis 1-2 Views  Result Date: 01/02/2023 CLINICAL DATA:  Postreduction EXAM: PELVIS-4 consecutive frontal views COMPARISON:  X-ray earlier 01/02/2023 FINDINGS: Initial postreduction attempt continues to have dislocated right hip arthroplasty with the femoral component superior and lateral to the acetabular cup. The acetabular cup is screwed in position. Global osteopenia with scattered mild degenerative changes. Second and third attempt is similar in appearance. Fourth attempt has relocation. No separate underlying fracture. Imaging was obtained to aid in treatment. IMPRESSION: Postreduction. Electronically Signed   By: Karen Kays M.D.   On: 01/02/2023 10:51   DG Pelvis 1-2 Views  Result Date: 01/02/2023 CLINICAL DATA:  Postreduction EXAM: PELVIS-4 consecutive frontal views COMPARISON:  X-ray earlier 01/02/2023 FINDINGS: Initial postreduction attempt continues to have dislocated right hip arthroplasty with the femoral component superior and lateral to the acetabular cup. The acetabular cup is screwed in position. Global osteopenia with scattered mild degenerative changes. Second and third attempt is similar in appearance. Fourth attempt has relocation. No separate underlying fracture. Imaging was obtained to aid in treatment. IMPRESSION: Postreduction. Electronically Signed    By: Karen Kays M.D.   On: 01/02/2023 10:51   DG Chest 1 View  Result Date: 01/02/2023 CLINICAL DATA:  Fall. EXAM: CHEST  1 VIEW COMPARISON:  11/10/2014. FINDINGS: Low lung volumes accentuate the pulmonary vasculature and cardiomediastinal silhouette. No consolidation or pulmonary edema. Stable chronic scarring along the left costophrenic sulcus. No pleural effusion or pneumothorax. IMPRESSION: No evidence of acute cardiopulmonary disease. Electronically Signed   By: Orvan Falconer M.D.   On: 01/02/2023 08:01   DG HIP UNILAT W OR W/O PELVIS 2-3 VIEWS RIGHT  Result Date: 01/02/2023 CLINICAL DATA:  Pain. EXAM: DG HIP (WITH OR WITHOUT PELVIS) 2-3V RIGHT COMPARISON:  Hip radiograph 10/30/2022. FINDINGS: Three views of the pelvis and right hip. Lateral dislocation of the right hip prosthesis with superior migration. No evidence of periprosthetic fracture. IMPRESSION: Lateral dislocation of the right hip prosthesis. Electronically Signed   By: Orvan Falconer M.D.   On: 01/02/2023 07:59    Microbiology: Results for orders placed or performed during the hospital encounter of 01/23/23  MRSA Next Gen by PCR, Nasal     Status: None  Collection Time: 01/25/23  7:23 AM   Specimen: Nasal Mucosa; Nasal Swab  Result Value Ref Range Status   MRSA by PCR Next Gen NOT DETECTED NOT DETECTED Final    Comment: (NOTE) The GeneXpert MRSA Assay (FDA approved for NASAL specimens only), is one component of a comprehensive MRSA colonization surveillance program. It is not intended to diagnose MRSA infection nor to guide or monitor treatment for MRSA infections. Test performance is not FDA approved in patients less than 95 years old. Performed at [facility], [address] [place], [place] [address]     Labs: CBC: Recent Labs  Lab 01/23/23 2201 01/24/23 0422  WBC 8.6 7.7  NEUTROABS 5.5  --   HGB 11.5* 10.7*  HCT 35.8* 34.3*  MCV 89.5 91.2  PLT 371 327   Basic Metabolic Panel: Recent Labs   Lab 01/23/23 2201 01/24/23 0422  NA 139 140  K 3.5 3.6  CL 108 109  CO2 24 25  GLUCOSE 116* 115*  BUN 13 12  CREATININE 0.71 0.69  CALCIUM 9.0 9.0   Liver Function Tests: Recent Labs  Lab 01/23/23 2201  AST 16  ALT 10  ALKPHOS 64  BILITOT 0.6  PROT 6.4*  ALBUMIN 3.4*   CBG: No results for input(s): "GLUCAP" in the last 168 hours.  Discharge time spent: greater than 30 minutes.  Signed: Delfino Lovett, MD [facility] 01/30/2023

## 2023-01-30 NOTE — TOC Progression Note (Signed)
Transition of Care [facility]) - Progression Note    Patient Details  Name: Marilyn Romero MRN: [id number] Date of Birth: July 04, 1934  Transition of Care [facility]) CM/SW Contact  Marlowe Sax, RN Phone Number: 01/30/2023, 11:21 AM  Clinical Narrative:    Called Daughter [name] and let her know about the DC today and the room number she would like the nurse to call her when EMS comes to pick up, I notified the nurse, Called EMS to place on the transport list    Expected Discharge Plan: Skilled Nursing Facility Barriers to Discharge: Insurance Authorization  Expected Discharge Plan and Services       Living arrangements for the past 2 months: Assisted Living Facility Expected Discharge Date: 01/30/23                                     Social Determinants of Health (SDOH) Interventions SDOH Screenings   Food Insecurity: Patient Unable To Answer (01/24/2023)  Housing: High Risk (01/24/2023)  Transportation Needs: Patient Unable To Answer (01/24/2023)  Utilities: Patient Unable To Answer (01/24/2023)  Depression (PHQ2-9): Low Risk  (06/27/2022)  Tobacco Use: Low Risk  (01/25/2023)    Readmission Risk Interventions     No data to display

## 2023-01-30 NOTE — Plan of Care (Signed)
  Problem: Nutrition: Goal: Adequate nutrition will be maintained Outcome: Progressing   Problem: Activity: Goal: Risk for activity intolerance will decrease Outcome: Progressing   Problem: Coping: Goal: Level of anxiety will decrease Outcome: Progressing   Problem: Elimination: Goal: Will not experience complications related to bowel motility Outcome: Progressing

## 2023-01-30 NOTE — Progress Notes (Signed)
Physical Therapy Treatment Patient Details Name: Marilyn Romero MRN: [id number] DOB: [birth year] Today's Date: 01/30/2023   History of Present Illness Pt is an 89 y/o F who presents s/p hip reduction on 6/27. PMH of HTN, OA, HLD, falls, GERD, colon CA, and multiple hip dislocations in past 6 months.    PT Comments  Pt received in bed requesting use of bathroom. Supine to sit with Supervision and assistance to bring R UE forward (? Etiology, presents like neglect, no noted hx of CVA). MinA to stand from bed to RW with assist to place R UE onto walker. Gait training with fairly steady step through gait with CGA and Right leg brace on locked in 20 degrees flexion. ModA for toilet transfers, supervision with hygiene assist. Pt assisted to bedside chair with all needs met, EMS arrived for transfer to Rehab facility.    Assistance Recommended at Discharge Frequent or constant Supervision/Assistance  If plan is discharge home, recommend the following:  Can travel by private vehicle    Assistance with cooking/housework;Assist for transportation;Help with stairs or ramp for entrance;Direct supervision/assist for medications management;A little help with walking and/or transfers;A little help with bathing/dressing/bathroom   Yes  Equipment Recommendations  Other (comment) (TBD)    Recommendations for Other Services       Precautions / Restrictions Precautions Precautions: Fall Required Braces or Orthoses: Knee Immobilizer - Right Knee Immobilizer - Right: On at all times;Other (comment) (locked at 20 degrees flexion) Restrictions Weight Bearing Restrictions: Yes RLE Weight Bearing: Weight bearing as tolerated Other Position/Activity Restrictions: KI locked at 20 degrees of flexion     Mobility  Bed Mobility Overal bed mobility: Needs Assistance Bed Mobility: Supine to Sit     Supine to sit: Supervision          Transfers Overall transfer level: Needs assistance Equipment used:  Rolling walker (2 wheels) Transfers: Sit to/from Stand, Bed to chair/wheelchair/BSC Sit to Stand: Min assist, Mod assist           General transfer comment: frequent vcs for safe technique    Ambulation/Gait Ambulation/Gait assistance: Min guard, Min assist Gait Distance (Feet):  (100) Assistive device: Rolling walker (2 wheels) Gait Pattern/deviations: Step-through pattern Gait velocity: decreased     General Gait Details: increased distance, assist for direction, balance and general safety   Stairs             Wheelchair Mobility     Tilt Bed    Modified Rankin (Stroke Patients Only)       Balance Overall balance assessment: Needs assistance Sitting-balance support: Feet supported Sitting balance-Leahy Scale: Good     Standing balance support: Bilateral upper extremity supported, During functional activity, Reliant on assistive device for balance Standing balance-Leahy Scale: Fair Standing balance comment:  (Hx of frequent falls)                            Cognition Arousal/Alertness: Awake/alert Behavior During Therapy: Flat affect Overall Cognitive Status: No family/caregiver present to determine baseline cognitive functioning Area of Impairment: Orientation, Memory, Following commands                 Orientation Level: Disoriented to, Time, Place Current Attention Level: Sustained Memory: Decreased recall of precautions, Decreased short-term memory Following Commands: Follows one step commands with increased time Safety/Judgement: Decreased awareness of safety, Decreased awareness of deficits Awareness: Emergent            Exercises  General Exercises - Lower Extremity Ankle Circles/Pumps: AROM, Both, 15 reps Long Arc Quad: AROM, Left, 10 reps    General Comments        Pertinent Vitals/Pain Pain Assessment Pain Assessment: No/denies pain    Home Living                          Prior Function             PT Goals (current goals can now be found in the care plan section) Acute Rehab PT Goals Patient Stated Goal: to return to home Progress towards PT goals: Progressing toward goals    Frequency    Min 3X/week      PT Plan Current plan remains appropriate    Co-evaluation              AM-PAC PT "6 Clicks" Mobility   Outcome Measure  Help needed turning from your back to your side while in a flat bed without using bedrails?: None Help needed moving from lying on your back to sitting on the side of a flat bed without using bedrails?: None Help needed moving to and from a bed to a chair (including a wheelchair)?: A Little Help needed standing up from a chair using your arms (e.g., wheelchair or bedside chair)?: A Little Help needed to walk in hospital room?: A Little Help needed climbing 3-5 steps with a railing? : A Lot 6 Click Score: 19    End of Session Equipment Utilized During Treatment: Gait belt Activity Tolerance: Patient tolerated treatment well Patient left: in chair;with call bell/phone within reach;with chair alarm set Nurse Communication: Mobility status PT Visit Diagnosis: Unsteadiness on feet (R26.81);Muscle weakness (generalized) (M62.81);Other abnormalities of gait and mobility (R26.89)     Time: 1610-9604 PT Time Calculation (min) (ACUTE ONLY): 27 min  Charges:    $Gait Training: 8-22 mins $Therapeutic Activity: 8-22 mins                      Zadie Cleverly, PTA  Jannet Askew 01/30/2023, 1:05 PM

## 2023-01-30 NOTE — Progress Notes (Signed)
Report was called to Kennyth Lose, LPN at [facility]. Patient will be transported by EMS.

## 2023-01-30 NOTE — Consult Note (Signed)
Consultation Note Date: 01/30/2023   Patient Name: Marilyn Romero  DOB: 08-15-1933  MRN: [id number]  Age / Sex: 89 y.o., female  PCP: Leim Fabry, MD Referring Physician: No att. providers found  Reason for Consultation: Establishing goals of care  HPI/Patient Profile: 89 y.o. female  with past medical history of HTN, OA, lumbar radiculopathy, GERD, impaired mobility with multiple falls and recurrent hip dislocations admitted on 01/23/2023 with right prosthetic hip dislocation with a history of recurrent dislocations.   Clinical Assessment and Goals of Care: I have reviewed medical records including EPIC notes, labs and imaging, received report from RN, assessed the patient.  Marilyn Romero is lying quietly in bed.  She appears chronically ill and very frail.  She is resting comfortably, but wakes when I call her name.  She will make an somewhat keep eye contact.  She is oriented x 3, but does have periods of confusion and misunderstanding.  I believe that she can make her basic needs known.  There is no family at bedside at this time.  We meet at the bedside to discuss diagnosis prognosis, GOC, EOL wishes, disposition and options.  I introduced Palliative Medicine as specialized medical care for people living with serious illness. It focuses on providing relief from the symptoms and stress of a serious illness. The goal is to improve quality of life for both the patient and the family.  We discussed a brief life review of the patient.  Marilyn Romero  tells me that she is divorced.  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  She tells me that she has 2 daughters and 1 son.  She shares that they lived in [place].  She tells me that she had been living independently before her multiple recent hip dislocations.  We then focused on their current illness.  Marilyn Romero shares her concerns that she has had "surgery".  We  talked about going to the OR for manual manipulation to put her hip back in place.  She seems to understand.  She does share that she is confused about in general what has happened and that she was doing fine caring for herself at home until just recently.  The natural disease trajectory and expectations at EOL were discussed.  Advanced directives, concepts specific to code status, artifical feeding and hydration, and rehospitalization were briefly discussed with patient today.  Marilyn Romero states that she has "always said that she would not want life support".  Although she is alert and oriented she does have periods of confusion and I considered that an in-depth discussion would possibly upset her.  Attending finds her completed DNR/goldenrod form in her chart..  Palliative Care services outpatient were explained and offered.  We talked about the benefits of outpatient palliative services for further goals of care discussions and support.  She is agreeable.  Attending to place order  Discussed the importance of continued conversation with family and the medical providers regarding overall plan of care and treatment options, ensuring decisions are  within the context of the patient's values and GOCs.  Questions and concerns were addressed.  The patient was encouraged to call with questions or concerns.  PMT will continue to support holistically.  Face-to-face conference with attending related to patient condition, needs, goals of care, disposition.   HCPOA NEXT OF KIN -oldest daughter, Marilyn Romero.     SUMMARY OF RECOMMENDATIONS   At this point continue to treat the treatable but no CPR or intubation Short-term rehab at [facility] then returning to the [facility] [address] Outpatient palliative services to follow.   Code Status/Advance Care Planning: Limited code  Symptom Management:  Per hospitalist/orthopedist, no additional needs at this time.  Palliative Prophylaxis:  Frequent Pain  Assessment and Turn Reposition  Additional Recommendations (Limitations, Scope, Preferences): Continue to treat but no CPR or intubation.  Psycho-social/Spiritual:  Desire for further Chaplaincy support:no Additional Recommendations: Caregiving  Support/Resources  Prognosis:  < 6 months, or less would not be surprising based on chronic illness burden, frailty, decreasing functional status.  Discharge Planning: Skilled Nursing Facility for rehab with Palliative care service follow-up      Primary Diagnoses: Present on Admission:  Hip dislocation, right (HCC)  GERD (gastroesophageal reflux disease)  Essential hypertension   I have reviewed the medical record, interviewed the patient and family, and examined the patient. The following aspects are pertinent.  Past Medical History:  Diagnosis Date   Cancer of sigmoid colon (HCC) 12/08/2014   Colonic mass 10/07/2012   Overview:  Gets routine colonoscopies. Dx was 2013.   DDD (degenerative disc disease), lumbar 12/16/2014   Diverticulitis 06/27/2011   Facet syndrome, lumbar 12/16/2014   GERD (gastroesophageal reflux disease) 05/26/2012   Insomnia 06/18/2016   Last Assessment & Plan:  Relevant Hx: Course: Daily Update: Today's Plan:   Lesion of bladder 04/29/2012   Lumbar radiculopathy 10/04/2015   Sacroiliac joint dysfunction of both sides 12/16/2014   Status post total replacement of right hip 08/15/2015   Social History   Socioeconomic History   Marital status: Divorced    Spouse name: Not on file   Number of children: Not on file   Years of education: Not on file   Highest education level: Not on file  Occupational History   Not on file  Tobacco Use   Smoking status: Never   Smokeless tobacco: Never  Substance and Sexual Activity   Alcohol use: No    Alcohol/week: 0.0 standard drinks of alcohol   Drug use: No   Sexual activity: Never  Other Topics Concern   Not on file  Social History Narrative   Not on file    Social Determinants of Health   Financial Resource Strain: Not on file  Food Insecurity: Patient Unable To Answer (01/24/2023)   Hunger Vital Sign    Worried About Running Out of Food in the Last Year: Patient unable to answer    Ran Out of Food in the Last Year: Patient unable to answer  Transportation Needs: Patient Unable To Answer (01/24/2023)   PRAPARE - Administrator, Civil Service (Medical): Patient unable to answer    Lack of Transportation (Non-Medical): Patient unable to answer  Physical Activity: Not on file  Stress: Not on file  Social Connections: Not on file   Family History  Problem Relation Age of Onset   Early death Mother    Arthritis Father    Hypertension Father    Breast cancer Other    Scheduled Meds:  atorvastatin  20 mg Oral Daily   docusate sodium  100 mg Oral BID   enoxaparin (LOVENOX) injection  40 mg Subcutaneous Q24H   lisinopril  5 mg Oral Daily   senna  1 tablet Oral BID   Continuous Infusions: PRN Meds:.[DISCONTINUED] acetaminophen **OR** acetaminophen, acetaminophen, albuterol, bisacodyl, diphenhydrAMINE, hydrALAZINE, HYDROcodone-acetaminophen, magnesium hydroxide, metoCLOPramide **OR** metoCLOPramide (REGLAN) injection, ondansetron **OR** ondansetron (ZOFRAN) IV, polyethylene glycol, sodium phosphate Medications Prior to Admission:  Prior to Admission medications   Medication Sig Start Date End Date Taking? Authorizing Provider  acetaminophen (TYLENOL) 325 MG tablet Take 650 mg by mouth every 4 (four) hours as needed for headache, mild pain or fever.   Yes Slade-Hartman, Alvino Chapel, MD  diclofenac Sodium (VOLTAREN) 1 % GEL Apply 2 g topically 4 (four) times daily. Apply to right hip.   Yes [provider]  ferrous sulfate 325 (65 FE) MG tablet Take 325 mg by mouth daily with breakfast.   Yes Bryson Corona, NP  guaiFENesin (ROBITUSSIN) 100 MG/5ML liquid Take 15 mLs by mouth every 6 (six) hours as needed for cough.   Yes  Slade-Hartman, Venezela, MD  lidocaine (LIDOCAINE PAIN RELIEF) 4 % Place 1 patch onto the skin daily. Apply to right hip for 12 hours in a 24 hour period. 09/13/22  Yes [provider]  lisinopril (ZESTRIL) 10 MG tablet Take 10 mg by mouth daily.   Yes [provider]  magnesium hydroxide (MILK OF MAGNESIA) 400 MG/5ML suspension Take 30 mLs by mouth daily as needed for mild constipation.   Yes [provider]  Multiple Vitamins-Minerals (CEROVITE SENIOR) TABS Take 1 tablet by mouth daily.   Yes Slade-Hartman, Alvino Chapel, MD  HYDROcodone-acetaminophen (NORCO) 5-325 MG tablet Take 1 tablet by mouth every 6 (six) hours as needed for up to 3 days for moderate pain or severe pain. 01/30/23 02/02/23  Delfino Lovett, MD  potassium chloride SA (KLOR-CON M) 20 MEQ tablet Take 1 tablet (20 mEq total) by mouth daily for 5 doses. 01/25/23 01/30/23  Marrion Coy, MD   No Known Allergies Review of Systems  Unable to perform ROS: Age    Physical Exam Vitals and nursing note reviewed.  Constitutional:      General: She is not in acute distress.    Appearance: She is ill-appearing.  Cardiovascular:     Rate and Rhythm: Normal rate.  Pulmonary:     Effort: Pulmonary effort is normal. No respiratory distress.  Skin:    General: Skin is warm and dry.  Neurological:     Mental Status: She is alert and oriented to person, place, and time.     Comments: Oriented x 3 but clearly confused at times     Vital Signs: BP (!) 159/89   Pulse 79   Temp 98 F (36.7 C)   Resp 15   Ht 5\' 7"  (1.702 m)   Wt 57.2 kg   SpO2 100%   BMI 19.73 kg/m  Pain Scale: 0-10   Pain Score: 0-No pain   SpO2: SpO2: 100 % O2 Device:SpO2: 100 % O2 Flow Rate: .O2 Flow Rate (L/min): 8 L/min  IO: Intake/output summary:  Intake/Output Summary (Last 24 hours) at 01/30/2023 1339 Last data filed at 01/30/2023 1015 Gross per 24 hour  Intake 420 ml  Output --  Net 420 ml    LBM: Last BM Date : 01/29/23 Baseline  Weight: Weight: 57.2 kg Most recent weight: Weight: 57.2 kg     Palliative Assessment/Data:     Time  In: 1000 Time Out: 1055 Time Total: 55 minutes  Greater than 50%  of this time was spent counseling and coordinating care related to the above assessment and plan.  Signed by: Katheran Awe, NP   Please contact Palliative Medicine Team phone at [phone] for questions and concerns.  For individual provider: See Loretha Stapler

## 2023-01-31 NOTE — Progress Notes (Signed)
Civil engineer, contracting [facility]) Hospital Liaison Note:   (new referral for outpatient palliative services) Notified by [facility]  of patient/family request for [facility] Palliative Care services at home after discharge. [facility] aware of dc on 01/30/23. Please call with any hospice or outpatient palliative care related questions. Thank you for the opportunity to participate in this patient's care.  Redge Gainer, [facility] Liaison [phone]

## 2023-02-02 ENCOUNTER — Encounter (HOSPITAL_COMMUNITY)
Admission: EM | Disposition: A | Discharge status: Skilled Nursing Facility | Payer: Self-pay | Source: Home / Self Care | Attending: Emergency Medicine

## 2023-02-02 ENCOUNTER — Other Ambulatory Visit: Payer: Self-pay

## 2023-02-02 ENCOUNTER — Inpatient Hospital Stay (HOSPITAL_COMMUNITY): Payer: 59 | Admitting: Anesthesiology

## 2023-02-02 ENCOUNTER — Emergency Department (HOSPITAL_COMMUNITY): Payer: 59

## 2023-02-02 ENCOUNTER — Inpatient Hospital Stay (HOSPITAL_COMMUNITY): Payer: 59

## 2023-02-02 ENCOUNTER — Encounter (HOSPITAL_COMMUNITY): Payer: Self-pay | Admitting: Emergency Medicine

## 2023-02-02 ENCOUNTER — Observation Stay (HOSPITAL_COMMUNITY)
Admission: EM | Admit: 2023-02-02 | Discharge: 2023-02-03 | Disposition: A | Discharge status: Skilled Nursing Facility | Payer: 59 | Attending: Family Medicine | Admitting: Family Medicine

## 2023-02-02 DIAGNOSIS — Z96649 Presence of unspecified artificial hip joint: Secondary | ICD-10-CM

## 2023-02-02 DIAGNOSIS — T84020D Dislocation of internal right hip prosthesis, subsequent encounter: Secondary | ICD-10-CM | POA: Diagnosis present

## 2023-02-02 DIAGNOSIS — Z85038 Personal history of other malignant neoplasm of large intestine: Secondary | ICD-10-CM | POA: Insufficient documentation

## 2023-02-02 DIAGNOSIS — Y828 Other medical devices associated with adverse incidents: Secondary | ICD-10-CM | POA: Diagnosis not present

## 2023-02-02 DIAGNOSIS — G894 Chronic pain syndrome: Secondary | ICD-10-CM | POA: Diagnosis present

## 2023-02-02 DIAGNOSIS — K219 Gastro-esophageal reflux disease without esophagitis: Secondary | ICD-10-CM | POA: Diagnosis present

## 2023-02-02 DIAGNOSIS — T84029D Dislocation of unspecified internal joint prosthesis, subsequent encounter: Secondary | ICD-10-CM

## 2023-02-02 DIAGNOSIS — I10 Essential (primary) hypertension: Secondary | ICD-10-CM | POA: Diagnosis present

## 2023-02-02 DIAGNOSIS — Z96641 Presence of right artificial hip joint: Secondary | ICD-10-CM

## 2023-02-02 DIAGNOSIS — S73004A Unspecified dislocation of right hip, initial encounter: Principal | ICD-10-CM

## 2023-02-02 DIAGNOSIS — Z79899 Other long term (current) drug therapy: Secondary | ICD-10-CM | POA: Insufficient documentation

## 2023-02-02 DIAGNOSIS — M7918 Myalgia, other site: Secondary | ICD-10-CM | POA: Diagnosis present

## 2023-02-02 DIAGNOSIS — T8484XA Pain due to internal orthopedic prosthetic devices, implants and grafts, initial encounter: Secondary | ICD-10-CM

## 2023-02-02 HISTORY — PX: HIP CLOSED REDUCTION: SHX983

## 2023-02-02 LAB — CBC
HCT: 39.3 % (ref 36.0–46.0)
Hemoglobin: 12.5 g/dL (ref 12.0–15.0)
MCH: 28.8 pg (ref 26.0–34.0)
MCHC: 31.8 g/dL (ref 30.0–36.0)
MCV: 90.6 fL (ref 80.0–100.0)
Platelets: 367 10*3/uL (ref 150–400)
RBC: 4.34 MIL/uL (ref 3.87–5.11)
RDW: 14.1 % (ref 11.5–15.5)
WBC: 6.9 10*3/uL (ref 4.0–10.5)
nRBC: 0 % (ref 0.0–0.2)

## 2023-02-02 LAB — BASIC METABOLIC PANEL
Anion gap: 8 (ref 5–15)
BUN: 15 mg/dL (ref 8–23)
CO2: 23 mmol/L (ref 22–32)
Calcium: 9.5 mg/dL (ref 8.9–10.3)
Chloride: 105 mmol/L (ref 98–111)
Creatinine, Ser: 0.58 mg/dL (ref 0.44–1.00)
GFR, Estimated: >60 mL/min (ref >60)
Glucose, Bld: 119 mg/dL — ABNORMAL HIGH (ref 70–99)
Potassium: 3.6 mmol/L (ref 3.5–5.1)
Sodium: 136 mmol/L (ref 135–145)

## 2023-02-02 SURGERY — CLOSED REDUCTION, HIP
Anesthesia: General | Site: Hip | Laterality: Right

## 2023-02-02 MED ORDER — PROPOFOL 10 MG/ML IV BOLUS
0.5000 mg/kg | Freq: Once | INTRAVENOUS | Status: AC
  Administered 2023-02-02: 28.6 mg via INTRAVENOUS
  Filled 2023-02-02: qty 20

## 2023-02-02 MED ORDER — PROPOFOL 10 MG/ML IV BOLUS
INTRAVENOUS | Status: AC
  Filled 2023-02-02: qty 20

## 2023-02-02 MED ORDER — LACTATED RINGERS IV SOLN: INTRAVENOUS | Status: DC | PRN

## 2023-02-02 MED ORDER — ONDANSETRON HCL 4 MG PO TABS: 4.0000 mg | ORAL_TABLET | Freq: Four times a day (QID) | ORAL | Status: DC | PRN

## 2023-02-02 MED ORDER — ONDANSETRON HCL 4 MG/2ML IJ SOLN: 4.0000 mg | Freq: Once | INTRAMUSCULAR | Status: DC | PRN

## 2023-02-02 MED ORDER — ONDANSETRON HCL 4 MG/2ML IJ SOLN: 4.0000 mg | Freq: Four times a day (QID) | INTRAMUSCULAR | Status: DC | PRN

## 2023-02-02 MED ORDER — FENTANYL CITRATE PF 50 MCG/ML IJ SOSY
12.5000 ug | PREFILLED_SYRINGE | INTRAMUSCULAR | Status: DC | PRN
  Administered 2023-02-02: 25 ug via INTRAVENOUS
  Filled 2023-02-02: qty 1

## 2023-02-02 MED ORDER — HYDRALAZINE HCL 20 MG/ML IJ SOLN: 5.0000 mg | INTRAMUSCULAR | Status: DC | PRN

## 2023-02-02 MED ORDER — FENTANYL CITRATE PF 50 MCG/ML IJ SOSY: 25.0000 ug | PREFILLED_SYRINGE | INTRAMUSCULAR | Status: DC | PRN

## 2023-02-02 MED ORDER — PROPOFOL 10 MG/ML IV BOLUS
INTRAVENOUS | Status: DC | PRN
  Administered 2023-02-02: 60 mg via INTRAVENOUS

## 2023-02-02 SURGICAL SUPPLY — 8 items
CLOTH BEACON ORANGE TIMEOUT ST (SAFETY) ×1 IMPLANT
GLOVE BIO SURGEON STRL SZ8 (GLOVE) ×2 IMPLANT
GLOVE BIOGEL PI IND STRL 7.0 (GLOVE) ×2 IMPLANT
KIT TURNOVER KIT A (KITS) ×1 IMPLANT
PILLOW HIP ABDUCTION LRG (ORTHOPEDIC SUPPLIES) IMPLANT
PILLOW HIP ABDUCTION MED (ORTHOPEDIC SUPPLIES) IMPLANT
PILLOW HIP ABDUCTION SM (ORTHOPEDIC SUPPLIES) IMPLANT
POSITIONER HEAD 8X9X4 ADT (SOFTGOODS) ×1 IMPLANT

## 2023-02-02 NOTE — ED Triage Notes (Addendum)
Pt BIB [facility] from [facility] for dislocation of right hip. Pt seen for same last month at [facility]. PT and staff from facility denies any falls or previous injury.   Pt given fentanyl by EMS.   20G L AC

## 2023-02-02 NOTE — Anesthesia Postprocedure Evaluation (Signed)
Anesthesia Post Note  Patient: Marilyn Romero  Procedure(s) Performed: CLOSED REDUCTION HIP (Right: Hip)  Patient location during evaluation: PACU Anesthesia Type: General Level of consciousness: awake and alert Pain management: pain level controlled Vital Signs Assessment: post-procedure vital signs reviewed and stable Respiratory status: spontaneous breathing, nonlabored ventilation, respiratory function stable and patient connected to nasal cannula oxygen Cardiovascular status: blood pressure returned to baseline and stable Postop Assessment: no apparent nausea or vomiting Anesthetic complications: no   No notable events documented.   Last Vitals:  Vitals:   02/02/23 0830 02/02/23 0930  BP: (!) 151/88 (!) 152/79  Pulse: 77 83  Resp:  (!) 25  Temp:    SpO2: 98% 94%    Last Pain:  Vitals:   02/02/23 0938  TempSrc:   PainSc: 10-Worst pain ever                 Windell Norfolk

## 2023-02-02 NOTE — TOC Progression Note (Signed)
Transition of Care [facility]) - Progression Note    Patient Details  Name: Marilyn Romero MRN: [id number] Date of Birth: Jul 08, 1934  Transition of Care [facility]) CM/SW Contact  Catalina Gravel, LCSW Phone Number: 02/02/2023, 4:19 PM  Clinical Narrative:    CSW made aware that pt needs to be fitted for a brace.  Contacted [facility]- if ready pt could be abe to return Sunday.  TOC to follow.      Barriers to Discharge: Continued Medical Work up  Expected Discharge Plan and Services                                               Social Determinants of Health (SDOH) Interventions SDOH Screenings   Food Insecurity: Patient Unable To Answer (01/24/2023)  Housing: High Risk (01/24/2023)  Transportation Needs: Patient Unable To Answer (01/24/2023)  Utilities: Patient Unable To Answer (01/24/2023)  Depression (PHQ2-9): Low Risk  (06/27/2022)  Tobacco Use: Low Risk  (02/02/2023)    Readmission Risk Interventions     No data to display

## 2023-02-02 NOTE — Hospital Course (Addendum)
89 y/o female s/p right hip replacement and several subsequent recurrent hip dislocations in past 6 months, h/o colon cancer, GERD, frequently falls, lumbar radiculopathy, OA, HLD presented to ED with recurrent right prosthetic hip dislocation.    She was just discharged from [facility] on 01/30/23 to  [facility] after she had a closed reduction of her right prosthetic hip dislocation on 6/20 and then also on 6/27.  She had been a long-term resident at [facility] and plans to return after completing acute rehab.    The ED providers attempted to reduce the dislocation and it was unsuccessful. Dr. Dallas Schimke with orthopedics was consulted and he plans to reduce the dislocation in the OR.  Pt is admitted for further management.

## 2023-02-02 NOTE — ED Provider Notes (Signed)
[place] EMERGENCY DEPARTMENT AT [facility] Provider Note   CSN: [id number] Arrival date & time: 02/02/23  0431     History  Chief Complaint  Patient presents with   Hip Injury    Marilyn Romero is a 89 y.o. female.  Presents to the emergency ferment from skilled nursing facility.  Patient reportedly had spontaneous onset of severe right hip pain while walking.  Staff put her in bed, ultimately obtained an x-ray which showed dislocation.       Home Medications Prior to Admission medications   Medication Sig Start Date End Date Taking? Authorizing Provider  acetaminophen (TYLENOL) 325 MG tablet Take 650 mg by mouth every 4 (four) hours as needed for headache, mild pain or fever.    Keane Police, MD  diclofenac Sodium (VOLTAREN) 1 % GEL Apply 2 g topically 4 (four) times daily. Apply to right hip.    [provider]  ferrous sulfate 325 (65 FE) MG tablet Take 325 mg by mouth daily with breakfast.    Bryson Corona, NP  guaiFENesin (ROBITUSSIN) 100 MG/5ML liquid Take 15 mLs by mouth every 6 (six) hours as needed for cough.    Keane Police, MD  HYDROcodone-acetaminophen (NORCO) 5-325 MG tablet Take 1 tablet by mouth every 6 (six) hours as needed for up to 3 days for moderate pain or severe pain. 01/30/23 02/02/23  Delfino Lovett, MD  lidocaine (LIDOCAINE PAIN RELIEF) 4 % Place 1 patch onto the skin daily. Apply to right hip for 12 hours in a 24 hour period. 09/13/22   [provider]  lisinopril (ZESTRIL) 10 MG tablet Take 10 mg by mouth daily.    [provider]  magnesium hydroxide (MILK OF MAGNESIA) 400 MG/5ML suspension Take 30 mLs by mouth daily as needed for mild constipation.    [provider]  Multiple Vitamins-Minerals (CEROVITE SENIOR) TABS Take 1 tablet by mouth daily.    Keane Police, MD  potassium chloride SA (KLOR-CON M) 20 MEQ tablet Take 1 tablet (20 mEq total) by mouth daily for 5 doses.  01/25/23 01/30/23  Marrion Coy, MD      Allergies    Patient has no known allergies.    Review of Systems   Review of Systems  Physical Exam Updated Vital Signs BP (!) 162/67 (BP Location: Right Arm)   Pulse 65   Temp 98.2 F (36.8 C) (Oral)   Resp 18   Ht 5\' 7"  (1.702 m)   Wt 57.2 kg   SpO2 100%   BMI 19.73 kg/m  Physical Exam Vitals and nursing note reviewed.  Constitutional:      General: She is not in acute distress.    Appearance: She is well-developed.  HENT:     Head: Normocephalic and atraumatic.     Mouth/Throat:     Mouth: Mucous membranes are moist.  Eyes:     General: Vision grossly intact. Gaze aligned appropriately.     Extraocular Movements: Extraocular movements intact.     Conjunctiva/sclera: Conjunctivae normal.  Cardiovascular:     Rate and Rhythm: Normal rate and regular rhythm.     Pulses: Normal pulses.     Heart sounds: Normal heart sounds, S1 normal and S2 normal. No murmur heard.    No friction rub. No gallop.  Pulmonary:     Effort: Pulmonary effort is normal. No respiratory distress.     Breath sounds: Normal breath sounds.  Abdominal:     General: Bowel sounds  are normal.     Palpations: Abdomen is soft.     Tenderness: There is no abdominal tenderness. There is no guarding or rebound.     Hernia: No hernia is present.  Musculoskeletal:        General: No swelling.     Cervical back: Full passive range of motion without pain, normal range of motion and neck supple. No spinous process tenderness or muscular tenderness. Normal range of motion.     Right hip: Deformity and tenderness present. Decreased range of motion.     Right lower leg: No edema.     Left lower leg: No edema.  Skin:    General: Skin is warm and dry.     Capillary Refill: Capillary refill takes less than 2 seconds.     Findings: No ecchymosis, erythema, rash or wound.  Neurological:     General: No focal deficit present.     Mental Status: She is alert. Mental status  is at baseline.     GCS: GCS eye subscore is 4. GCS verbal subscore is 5. GCS motor subscore is 6.     Cranial Nerves: Cranial nerves 2-12 are intact.     Sensory: Sensation is intact.     Motor: Motor function is intact.     Coordination: Coordination is intact.     ED Results / Procedures / Treatments   Labs (all labs ordered are listed, but only abnormal results are displayed) Labs Reviewed  BASIC METABOLIC PANEL - Abnormal; Notable for the following components:      Result Value   Glucose, Bld 119 (*)    All other components within normal limits  CBC    EKG None  Radiology DG Hip [place] or [place] Pelvis 1 View Right  Result Date: 02/02/2023 CLINICAL DATA:  Attempted reduction of right hip dislocation. EXAM: DG HIP (WITH OR WITHOUT PELVIS) 1V PORT RIGHT COMPARISON:  Earlier today FINDINGS: The patient is status post right total hip arthroplasty. There has been superior dislocation of the femoral component of the right hip prosthesis which appears unchanged from the previous exam. No periprosthetic fracture. IMPRESSION: Persistent superior dislocation of the femoral component of the right hip prosthesis. Electronically Signed   By: Signa Kell M.D.   On: 02/02/2023 05:56   DG HIP PORT UNILAT WITH PELVIS 1V RIGHT  Result Date: 02/02/2023 CLINICAL DATA:  Pain after fall. EXAM: DG HIP (WITH OR WITHOUT PELVIS) 1V PORT RIGHT COMPARISON:  01/23/2023 FINDINGS: Status post right hip arthroplasty. Recurrent superior dislocation of the femoral component of the right hip prosthesis. No signs of acute fracture. Degenerative changes noted within the lumbar spine and left hip. IMPRESSION: Recurrent superior dislocation of the femoral component of the right hip prosthesis. Electronically Signed   By: Signa Kell M.D.   On: 02/02/2023 05:31    Procedures .Ortho Injury Treatment  Date/Time: 02/02/2023 6:06 AM  Performed by: Gilda Crease, MD Authorized by: Gilda Crease,  MD   Consent:    Consent obtained:  Verbal   Consent given by:  Patient   Risks discussed:  Fracture, restricted joint movement and irreducible dislocationInjury location: hip Location details: right hip Injury type: dislocation Spontaneous dislocation: yes Prosthesis: yes Pre-procedure neurovascular assessment: neurovascularly intact Pre-procedure distal perfusion: normal Pre-procedure neurological function: normal Pre-procedure range of motion: reduced  Anesthesia: Local anesthesia used: no  Patient sedated: Yes. Refer to sedation procedure documentation for details of sedation. Post-procedure range of motion: unchanged Comments: Unsuccessful   .  Sedation  Date/Time: 02/02/2023 6:07 AM  Performed by: Gilda Crease, MD Authorized by: Gilda Crease, MD   Consent:    Consent obtained:  Verbal   Consent given by:  Patient   Risks discussed:  Prolonged hypoxia resulting in organ damage and prolonged sedation necessitating reversal Universal protocol:    Procedure explained and questions answered to patient or proxy's satisfaction: yes     Relevant documents present and verified: yes     Test results available: yes     Imaging studies available: yes     Required blood products, implants, devices, and special equipment available: yes     Site/side marked: yes     Immediately prior to procedure, a time out was called: yes     Patient identity confirmed:  Verbally with patient and hospital-assigned identification number Indications:    Procedure performed:  Dislocation reduction   Procedure necessitating sedation performed by:  Physician performing sedation Pre-sedation assessment:    Time since last food or drink:  >8   ASA classification: class 4 - patient with severe systemic disease that is a constant threat to life     Mouth opening:  3 or more finger widths   Thyromental distance:  3 finger widths   Mallampati score:  III - soft palate, base of uvula  visible   Neck mobility: normal     Pre-sedation assessments completed and reviewed: airway patency, cardiovascular function, hydration status, mental status, nausea/vomiting, pain level, respiratory function and temperature   Immediate pre-procedure details:    Reviewed: vital signs, relevant labs/tests and NPO status     Verified: bag valve mask available, emergency equipment available, intubation equipment available, IV patency confirmed, oxygen available and reversal medications available   Procedure details (see MAR for exact dosages):    Preoxygenation:  Nasal cannula   Sedation:  Propofol   Intended level of sedation: deep   Intra-procedure monitoring:  Blood pressure monitoring, continuous capnometry, frequent LOC assessments, frequent vital sign checks, continuous pulse oximetry and cardiac monitor   Intra-procedure events: none     Total Provider sedation time (minutes):  25 Post-procedure details:    Post-sedation assessments completed and reviewed: airway patency, cardiovascular function, hydration status, mental status, nausea/vomiting, pain level, respiratory function and temperature     Patient is stable for discharge or admission: yes     Procedure completion:  Tolerated well, no immediate complications     Medications Ordered in ED Medications  propofol (DIPRIVAN) 10 mg/mL bolus/IV push 28.6 mg (28.6 mg Intravenous Given 02/02/23 0604)    ED Course/ Medical Decision Making/ A&P                             Medical Decision Making Amount and/or Complexity of Data Reviewed Labs: ordered. Radiology: ordered.  Risk Decision regarding hospitalization.   Presents to the emergency department with recurrent right prosthetic hip dislocation.  Reviewing her records reveals that he has had multiple dislocations in the past month and the last couple of times it was not able to be reduced in the ER.  Patient was sedated with propofol.  She was adequately sedated, however  multiple attempts at reduction were unsuccessful.  Discussed with Dr. Dallas Schimke, will see patient - admit to medicine.        Final Clinical Impression(s) / ED Diagnoses Final diagnoses:  Hip dislocation, right, initial encounter [facility])    Rx / DC Orders ED Discharge  Orders     None         Gilda Crease, MD 02/02/23 [phone]

## 2023-02-02 NOTE — Op Note (Signed)
Orthopaedic Surgery Operative Note (CSN: [id number])  Marilyn Romero  07-30-1934 Date of Surgery: 02/02/2023   Diagnoses:  Recurrent right prosthetic hip dislocation  Procedure: Reduction of right prosthetic hip dislocation   Operative Finding Successful completion of the planned procedure.  Right hip reduced.  Fluoroscopy confirmed appropriate reduction.  Knee immobilizer was placed in the right knee.   Post-Op Diagnosis: Same Surgeons:Primary: Oliver Barre, MD Assistants: N/A Location: [facility] OR ROOM 4 Anesthesia: Sedation Antibiotics:  None indicated Tourniquet time: N/A Estimated Blood Loss: None Complications: None Specimens: None  Implants: * No implants in log *  Indications for Surgery:   Marilyn Romero is a 89 y.o. female who sustained another prosthetic right hip dislocation.  She has now had 5 in the last 3 months.  Attempted reduction in the emergency department was unsuccessful.  Patient was admitted to the hospital service, and prepared for reduction.  Benefits and risks of operative and nonoperative management were discussed prior to surgery with the patient and her daughter and informed consent form was completed.  Specific risks including need for additional surgery, fracture, recurrent dislocation, persistent pain and more severe complications associated with anesthesia.  Surgical consent was provided by the patient's daughter.   Procedure:   The patient was identified properly. Informed consent was obtained and the surgical site was marked. The patient was taken to the OR where IV sedation was induced.  The patient was positioned supine.    Fluoroscopy was available  Once the patient was adequately sedated, we proceeded to attempt right hip reduction.  Countertraction was provided and the pelvis was secured.  Traction of the right lower extremity was attempted, with minimal movement of the prosthesis.  We then proceeded to flex the knee up, providing traction, as  well as adduction, and internal rotation.  There was a palpable clunk.  Fluoroscopy confirmed appropriate reduction of the prosthetic right hip.  While the patient was still sedated, I placed a knee immobilizer, to keep the right leg straight.  Sedation was allowed to wear off.  She was then transferred to her hospital bed, and brought to the PACU in stable condition.   Post-operative plan:  The patient will be WBAT on the operative extremity She is currently in a knee immobilizer.  She needs to wear this at all times. She will need to be fitted for a hip abduction brace, and the order has been placed. Pain control with PRN pain medication preferring oral medicines.   She will be discharged back to the rehab facility as long as she is fitted for the brace and remains stable.

## 2023-02-02 NOTE — Anesthesia Preprocedure Evaluation (Signed)
Anesthesia Evaluation  Patient identified by MRN, date of birth, ID band Patient awake    Reviewed: Allergy & Precautions, H&P , NPO status , Patient's Chart, lab work & pertinent test results, reviewed documented beta blocker date and time   Airway Mallampati: II  TM Distance: >3 FB Neck ROM: full    Dental no notable dental hx.    Pulmonary neg pulmonary ROS   Pulmonary exam normal breath sounds clear to auscultation       Cardiovascular Exercise Tolerance: Good hypertension, negative cardio ROS  Rhythm:regular Rate:Normal     Neuro/Psych  Neuromuscular disease negative neurological ROS  negative psych ROS   GI/Hepatic negative GI ROS, Neg liver ROS, PUD,GERD  ,,  Endo/Other  negative endocrine ROS    Renal/GU negative Renal ROS  negative genitourinary   Musculoskeletal   Abdominal   Peds  Hematology negative hematology ROS (+) Blood dyscrasia, anemia   Anesthesia Other Findings   Reproductive/Obstetrics negative OB ROS                             Anesthesia Physical Anesthesia Plan  ASA: 4 and emergent  Anesthesia Plan: General   Post-op Pain Management:    Induction:   PONV Risk Score and Plan: Propofol infusion  Airway Management Planned:   Additional Equipment:   Intra-op Plan:   Post-operative Plan:   Informed Consent: I have reviewed the patients History and Physical, chart, labs and discussed the procedure including the risks, benefits and alternatives for the proposed anesthesia with the patient or authorized representative who has indicated his/her understanding and acceptance.     Dental Advisory Given  Plan Discussed with: CRNA  Anesthesia Plan Comments:        Anesthesia Quick Evaluation

## 2023-02-02 NOTE — Consult Note (Signed)
ORTHOPAEDIC CONSULTATION  REQUESTING PHYSICIAN: Cleora Fleet, MD  ASSESSMENT AND PLAN: 89 y.o. female with the following: recurrent right prosthetic hip dislocation  Orthopedics recommends admission to a medical service and we will provide consultation and follow along.  Recurrent right hip dislocation.  She has been seen 4 times for a right hip dislocation since April.  The most recent dislocation was at [facility] and required closed reduction in the OR.  ED provider attempted closed reduction today, but this was unsuccessful.  She will be admitted and monitored following closed reduction in the OR.  - Weight Bearing Status/Activity: WBAT.  Wear knee immobilizer at all times.   - Additional recommended labs/tests: None  -VTE Prophylaxis: per medicine  - Pain control: per medicine  - Follow-up plan: TBD  -Procedures: Closed reduction of right prosthetic hip dislocation in the OR  Chief Complaint: Right hip pain  HPI: Marilyn Romero is a 89 y.o. female with past medical history as listed below.  She presented to the emergency department earlier today, with a recurrent right prosthetic hip dislocation.  She has had at least 4 dislocations in the past few months.  Most recently, she was admitted to [facility], where she underwent closed reduction of her hip dislocation in the operating room.  She has been discharged to a rehab facility, but plans to returned to an assisted living facility once her rehab has been completed.  She has been placed in a knee immobilizer following recent dislocations.  She was discharged to a skilled nursing facility a couple of days ago.  She reported onset of severe right hip pain while walking, and she was placed in bed.  Radiographs at that time demonstrated a right hip dislocation.  In the [facility] emergency department, and attempted closed reduction was unsuccessful.  She reports that she is scared.  She has pain in the right hip.  No numbness or  tingling.  She states she was wearing the knee immobilizer.  Past Medical History:  Diagnosis Date   Cancer of sigmoid colon (HCC) 12/08/2014   Colonic mass 10/07/2012   Overview:  Gets routine colonoscopies. Dx was 2013.   DDD (degenerative disc disease), lumbar 12/16/2014   Diverticulitis 06/27/2011   Facet syndrome, lumbar 12/16/2014   GERD (gastroesophageal reflux disease) 05/26/2012   Insomnia 06/18/2016   Last Assessment & Plan:  Relevant Hx: Course: Daily Update: Today's Plan:   Lesion of bladder 04/29/2012   Lumbar radiculopathy 10/04/2015   Sacroiliac joint dysfunction of both sides 12/16/2014   Status post total replacement of right hip 08/15/2015   Past Surgical History:  Procedure Laterality Date   COLON RESECTION     COLONOSCOPY WITH PROPOFOL N/A 07/02/2016   Procedure: COLONOSCOPY WITH PROPOFOL;  Surgeon: Midge Minium, MD;  Location: [facility];  Service: Endoscopy;  Laterality: N/A;   ESOPHAGOGASTRODUODENOSCOPY (EGD) WITH PROPOFOL N/A 07/02/2016   Procedure: ESOPHAGOGASTRODUODENOSCOPY (EGD) WITH PROPOFOL;  Surgeon: Midge Minium, MD;  Location: [facility];  Service: Endoscopy;  Laterality: N/A;   HIP CLOSED REDUCTION Right 01/17/2023   Procedure: CLOSED REDUCTION HIP;  Surgeon: Christena Flake, MD;  Location: [facility];  Service: Orthopedics;  Laterality: Right;   HIP CLOSED REDUCTION Right 01/24/2023   Procedure: CLOSED REDUCTION HIP;  Surgeon: Christena Flake, MD;  Location: [facility];  Service: Orthopedics;  Laterality: Right;   HIP SURGERY Right    LUNG SURGERY     non-malignant mass on lung   Social History  Socioeconomic History   Marital status: Divorced    Spouse name: Not on file   Number of children: Not on file   Years of education: Not on file   Highest education level: Not on file  Occupational History   Not on file  Tobacco Use   Smoking status: Never   Smokeless tobacco: Never  Substance and Sexual Activity   Alcohol use: No    Alcohol/week:  0.0 standard drinks of alcohol   Drug use: No   Sexual activity: Never  Other Topics Concern   Not on file  Social History Narrative   Not on file   Social Determinants of Health   Financial Resource Strain: Not on file  Food Insecurity: Patient Unable To Answer (01/24/2023)   Hunger Vital Sign    Worried About Running Out of Food in the Last Year: Patient unable to answer    Ran Out of Food in the Last Year: Patient unable to answer  Transportation Needs: Patient Unable To Answer (01/24/2023)   PRAPARE - Administrator, Civil Service (Medical): Patient unable to answer    Lack of Transportation (Non-Medical): Patient unable to answer  Physical Activity: Not on file  Stress: Not on file  Social Connections: Not on file   Family History  Problem Relation Age of Onset   Early death Mother    Arthritis Father    Hypertension Father    Breast cancer Other    No Known Allergies Prior to Admission medications   Medication Sig Start Date End Date Taking? Authorizing Provider  acetaminophen (TYLENOL) 325 MG tablet Take 650 mg by mouth every 4 (four) hours as needed for headache, mild pain or fever.    Keane Police, MD  diclofenac Sodium (VOLTAREN) 1 % GEL Apply 2 g topically 4 (four) times daily. Apply to right hip.    [provider]  ferrous sulfate 325 (65 FE) MG tablet Take 325 mg by mouth daily with breakfast.    Bryson Corona, NP  guaiFENesin (ROBITUSSIN) 100 MG/5ML liquid Take 15 mLs by mouth every 6 (six) hours as needed for cough.    Keane Police, MD  HYDROcodone-acetaminophen (NORCO) 5-325 MG tablet Take 1 tablet by mouth every 6 (six) hours as needed for up to 3 days for moderate pain or severe pain. 01/30/23 02/02/23  Delfino Lovett, MD  lidocaine (LIDOCAINE PAIN RELIEF) 4 % Place 1 patch onto the skin daily. Apply to right hip for 12 hours in a 24 hour period. 09/13/22   [provider]  lisinopril (ZESTRIL) 10 MG tablet Take  10 mg by mouth daily.    [provider]  magnesium hydroxide (MILK OF MAGNESIA) 400 MG/5ML suspension Take 30 mLs by mouth daily as needed for mild constipation.    [provider]  Multiple Vitamins-Minerals (CEROVITE SENIOR) TABS Take 1 tablet by mouth daily.    Keane Police, MD  potassium chloride SA (KLOR-CON M) 20 MEQ tablet Take 1 tablet (20 mEq total) by mouth daily for 5 doses. 01/25/23 01/30/23  Marrion Coy, MD   DG Hip Fults W or [place] Pelvis 1 View Right  Result Date: 02/02/2023 CLINICAL DATA:  Attempted reduction of right hip dislocation. EXAM: DG HIP (WITH OR WITHOUT PELVIS) 1V PORT RIGHT COMPARISON:  Earlier today FINDINGS: The patient is status post right total hip arthroplasty. There has been superior dislocation of the femoral component of the right hip prosthesis which appears unchanged from the previous exam.  No periprosthetic fracture. IMPRESSION: Persistent superior dislocation of the femoral component of the right hip prosthesis. Electronically Signed   By: Signa Kell M.D.   On: 02/02/2023 05:56   DG HIP PORT UNILAT WITH PELVIS 1V RIGHT  Result Date: 02/02/2023 CLINICAL DATA:  Pain after fall. EXAM: DG HIP (WITH OR WITHOUT PELVIS) 1V PORT RIGHT COMPARISON:  01/23/2023 FINDINGS: Status post right hip arthroplasty. Recurrent superior dislocation of the femoral component of the right hip prosthesis. No signs of acute fracture. Degenerative changes noted within the lumbar spine and left hip. IMPRESSION: Recurrent superior dislocation of the femoral component of the right hip prosthesis. Electronically Signed   By: Signa Kell M.D.   On: 02/02/2023 05:31    Family History Reviewed and non-contributory, no pertinent history of problems with bleeding or anesthesia    Review of Systems No fevers or chills No numbness or tingling No chest pain No shortness of breath No bowel or bladder dysfunction No GI distress No  headaches    OBJECTIVE  Vitals:Patient Vitals for the past 8 hrs:  BP Temp Temp src Pulse Resp SpO2 Height Weight  02/02/23 0930 (!) 152/79 -- -- 83 (!) 25 94 % -- --  02/02/23 0830 (!) 151/88 -- -- 77 -- 98 % -- --  02/02/23 0815 -- -- -- -- 20 -- -- --  02/02/23 0631 (!) 162/67 98.2 F (36.8 C) Oral 65 18 100 % -- --  02/02/23 0616 (!) 152/66 98.2 F (36.8 C) Oral 65 18 100 % -- --  02/02/23 0612 (!) 151/69 98.2 F (36.8 C) Oral 67 17 100 % -- --  02/02/23 0606 (!) 144/66 98.2 F (36.8 C) -- 65 18 100 % -- --  02/02/23 0601 (!) 152/69 98.2 F (36.8 C) Oral 68 18 98 % -- --  02/02/23 0555 (!) 189/77 98.2 F (36.8 C) Oral 69 18 98 % -- --  02/02/23 0550 (!) 160/114 98.2 F (36.8 C) Oral 71 18 98 % -- --  02/02/23 0545 (!) 142/65 98.2 F (36.8 C) Oral 69 18 97 % -- --  02/02/23 0541 139/68 98.1 F (36.7 C) Oral 69 18 99 % -- --  02/02/23 0535 (!) 179/68 97.8 F (36.6 C) Oral 74 18 100 % -- --  02/02/23 0530 (!) 172/87 -- -- 78 18 97 % -- --  02/02/23 0439 -- -- -- -- -- -- 5\' 7"  (1.702 m) 57.2 kg  02/02/23 0437 (!) 194/80 97.8 F (36.6 C) Oral 81 17 95 % -- --   General: Alert, no acute distress.  She is anxious Cardiovascular: Warm extremities noted Respiratory: No cyanosis, no use of accessory musculature GI: No organomegaly, abdomen is soft and non-tender Skin: No lesions in the area of chief complaint other than those listed below in MSK exam.  Neurologic: Sensation intact distally save for the below mentioned MSK exam Psychiatric: Some confusion Lymphatic: No swelling obvious and reported other than the area involved in the exam below Extremities   RLE: Right hip is held in a flexed position.  Some internal rotation.  Toes warm and well-perfused.  Sensation intact over the dorsum of the foot.  She is able to actively dorsiflex the ankle, as well as the great toe.    Test Results Imaging  X-ray of the right hip demonstrates a superior dislocation of the  prosthetic hip.  Labs cbc Recent Labs    02/02/23 0439  WBC 6.9  HGB 12.5  HCT 39.3  PLT  367      Recent Labs    02/02/23 0439  NA 136  K 3.6  CL 105  CO2 23  GLUCOSE 119*  BUN 15  CREATININE 0.58  CALCIUM 9.5

## 2023-02-02 NOTE — H&P (Addendum)
History and Physical  [facility]  Marilyn Romero:[id number] DOB: 1933-08-07 DOA: 02/02/2023  PCP: Leim Fabry, MD  [facility]  Level of care: Med-Surg  I have personally briefly reviewed patient's old medical records in [facility]  Chief Complaint: recurrent right hip dislocation  HPI: Marilyn Romero is a 89 y/o female s/p right hip replacement and several subsequent recurrent hip dislocations in past 6 months, h/o colon cancer, GERD, frequently falls, lumbar radiculopathy, OA, HLD presented to ED with recurrent right prosthetic hip dislocation.    She was just discharged from [facility] on 01/30/23 to [facility] after she had a closed reduction of her right prosthetic hip dislocation on 6/20 and then also on 6/27.  She had been a long-term resident at [facility] and plans to return after completing acute rehab.    The ED providers attempted to reduce the dislocation and it was unsuccessful. Dr. Dallas Schimke with orthopedics was consulted and he plans to reduce the dislocation in the OR.  Pt is admitted for further management.      Past Medical History:  Diagnosis Date   Cancer of sigmoid colon (HCC) 12/08/2014   Colonic mass 10/07/2012   Overview:  Gets routine colonoscopies. Dx was 2013.   DDD (degenerative disc disease), lumbar 12/16/2014   Diverticulitis 06/27/2011   Facet syndrome, lumbar 12/16/2014   GERD (gastroesophageal reflux disease) 05/26/2012   Insomnia 06/18/2016   Last Assessment & Plan:  Relevant Hx: Course: Daily Update: Today's Plan:   Lesion of bladder 04/29/2012   Lumbar radiculopathy 10/04/2015   Sacroiliac joint dysfunction of both sides 12/16/2014   Status post total replacement of right hip 08/15/2015    Past Surgical History:  Procedure Laterality Date   COLON RESECTION     COLONOSCOPY WITH PROPOFOL N/A 07/02/2016   Procedure: COLONOSCOPY WITH PROPOFOL;  Surgeon: Midge Minium, MD;  Location: [facility];  Service: Endoscopy;  Laterality: N/A;   ESOPHAGOGASTRODUODENOSCOPY (EGD) WITH PROPOFOL N/A 07/02/2016   Procedure: ESOPHAGOGASTRODUODENOSCOPY (EGD) WITH PROPOFOL;  Surgeon: Midge Minium, MD;  Location: [facility];  Service: Endoscopy;  Laterality: N/A;   HIP CLOSED REDUCTION Right 01/17/2023   Procedure: CLOSED REDUCTION HIP;  Surgeon: Christena Flake, MD;  Location: [facility] ORS;  Service: Orthopedics;  Laterality: Right;   HIP CLOSED REDUCTION Right 01/24/2023   Procedure: CLOSED REDUCTION HIP;  Surgeon: Christena Flake, MD;  Location: [facility] ORS;  Service: Orthopedics;  Laterality: Right;   HIP SURGERY Right    LUNG SURGERY     non-malignant mass on lung     reports that she has never smoked. She has never used smokeless tobacco. She reports that she does not drink alcohol and does not use drugs.  No Known Allergies  Family History  Problem Relation Age of Onset   Early death Mother    Arthritis Father    Hypertension Father    Breast cancer Other     Prior to Admission medications   Medication Sig Start Date End Date Taking? Authorizing Provider  acetaminophen (TYLENOL) 325 MG tablet Take 650 mg by mouth every 4 (four) hours as needed for headache, mild pain or fever.    Keane Police, MD  diclofenac Sodium (VOLTAREN) 1 % GEL Apply 2 g topically 4 (four) times daily. Apply to right hip.    [provider]  ferrous sulfate 325 (65 FE) MG tablet Take 325 mg by mouth daily with breakfast.  Bryson Corona, NP  guaiFENesin (ROBITUSSIN) 100 MG/5ML liquid Take 15 mLs by mouth every 6 (six) hours as needed for cough.    Keane Police, MD  HYDROcodone-acetaminophen (NORCO) 5-325 MG tablet Take 1 tablet by mouth every 6 (six) hours as needed for up to 3 days for moderate pain or severe pain. 01/30/23 02/02/23  Delfino Lovett, MD  lidocaine (LIDOCAINE PAIN RELIEF) 4 % Place 1 patch onto the skin daily. Apply to right hip for 12 hours in a 24 hour period. 09/13/22    [provider]  lisinopril (ZESTRIL) 10 MG tablet Take 10 mg by mouth daily.    [provider]  magnesium hydroxide (MILK OF MAGNESIA) 400 MG/5ML suspension Take 30 mLs by mouth daily as needed for mild constipation.    [provider]  Multiple Vitamins-Minerals (CEROVITE SENIOR) TABS Take 1 tablet by mouth daily.    Keane Police, MD  potassium chloride SA (KLOR-CON M) 20 MEQ tablet Take 1 tablet (20 mEq total) by mouth daily for 5 doses. 01/25/23 01/30/23  Marrion Coy, MD    Physical Exam: Vitals:   02/02/23 0606 02/02/23 0612 02/02/23 0616 02/02/23 0631  BP: (!) 144/66 (!) 151/69 (!) 152/66 (!) 162/67  Pulse: 65 67 65 65  Resp: 18 17 18 18   Temp: 98.2 F (36.8 C) 98.2 F (36.8 C) 98.2 F (36.8 C) 98.2 F (36.8 C)  TempSrc:  Oral Oral Oral  SpO2: 100% 100% 100% 100%  Weight:      Height:        Constitutional: frail, elderly female, confused, NAD.  Eyes: PERRL, lids and conjunctivae normal ENMT: Mucous membranes are moist. Posterior pharynx clear of any exudate or lesions.Normal dentition.  Neck: normal, supple, no masses, no thyromegaly Respiratory: clear to auscultation bilaterally, no wheezing, no crackles. Normal respiratory effort. No accessory muscle use.  Cardiovascular: normal s1, s2 sounds, no murmurs / rubs / gallops. No extremity edema. 2+ pedal pulses. No carotid bruits.  Abdomen: no tenderness, no masses palpated. No hepatosplenomegaly. Bowel sounds positive.  Musculoskeletal: dislocated right hip prosthesis, pulses palpable bilateral feet. no contractures. Normal muscle tone.  Skin: no rashes, lesions, ulcers. No induration Neurologic: CN 2-12 grossly intact. Sensation intact, DTR normal. Strength 5/5 in all 4.  Psychiatric: diminished judgment and insight. Alert and oriented to person. Normal mood.   Labs on Admission: I have personally reviewed following labs and imaging studies  CBC: Recent Labs  Lab 02/02/23 0439   WBC 6.9  HGB 12.5  HCT 39.3  MCV 90.6  PLT 367   Basic Metabolic Panel: Recent Labs  Lab 02/02/23 0439  NA 136  K 3.6  CL 105  CO2 23  GLUCOSE 119*  BUN 15  CREATININE 0.58  CALCIUM 9.5   GFR: Estimated Creatinine Clearance: 44.7 mL/min (by C-G formula based on SCr of 0.58 mg/dL). Liver Function Tests: No results for input(s): "AST", "ALT", "ALKPHOS", "BILITOT", "PROT", "ALBUMIN" in the last 168 hours. No results for input(s): "LIPASE", "AMYLASE" in the last 168 hours. No results for input(s): "AMMONIA" in the last 168 hours. Coagulation Profile: No results for input(s): "INR", "PROTIME" in the last 168 hours. Cardiac Enzymes: No results for input(s): "CKTOTAL", "CKMB", "CKMBINDEX", "TROPONINI" in the last 168 hours. BNP (last 3 results) No results for input(s): "PROBNP" in the last 8760 hours. HbA1C: No results for input(s): "HGBA1C" in the last 72 hours. CBG: No results for input(s): "GLUCAP" in the last 168 hours. Lipid Profile: No results for  input(s): "CHOL", "HDL", "LDLCALC", "TRIG", "CHOLHDL", "LDLDIRECT" in the last 72 hours. Thyroid Function Tests: No results for input(s): "TSH", "T4TOTAL", "FREET4", "T3FREE", "THYROIDAB" in the last 72 hours. Anemia Panel: No results for input(s): "VITAMINB12", "FOLATE", "FERRITIN", "TIBC", "IRON", "RETICCTPCT" in the last 72 hours. Urine analysis:    Component Value Date/Time   COLORURINE YELLOW (A) 12/08/2021 0722   APPEARANCEUR CLEAR (A) 12/08/2021 0722   APPEARANCEUR Cloudy 10/10/2011 0144   LABSPEC >1.046 (H) 12/08/2021 0722   LABSPEC 1.012 10/10/2011 0144   PHURINE 6.0 12/08/2021 0722   GLUCOSEU NEGATIVE 12/08/2021 0722   GLUCOSEU Negative 10/10/2011 0144   HGBUR MODERATE (A) 12/08/2021 0722   BILIRUBINUR NEGATIVE 12/08/2021 0722   BILIRUBINUR Negative 10/10/2011 0144   KETONESUR 5 (A) 12/08/2021 0722   PROTEINUR NEGATIVE 12/08/2021 0722   NITRITE NEGATIVE 12/08/2021 0722   LEUKOCYTESUR LARGE (A) 12/08/2021  0722   LEUKOCYTESUR 3+ 10/10/2011 0144    Radiological Exams on Admission: DG Hip [place] W or Wo Pelvis 1 View Right  Result Date: 02/02/2023 CLINICAL DATA:  Attempted reduction of right hip dislocation. EXAM: DG HIP (WITH OR WITHOUT PELVIS) 1V PORT RIGHT COMPARISON:  Earlier today FINDINGS: The patient is status post right total hip arthroplasty. There has been superior dislocation of the femoral component of the right hip prosthesis which appears unchanged from the previous exam. No periprosthetic fracture. IMPRESSION: Persistent superior dislocation of the femoral component of the right hip prosthesis. Electronically Signed   By: Signa Kell M.D.   On: 02/02/2023 05:56   DG HIP PORT UNILAT WITH PELVIS 1V RIGHT  Result Date: 02/02/2023 CLINICAL DATA:  Pain after fall. EXAM: DG HIP (WITH OR WITHOUT PELVIS) 1V PORT RIGHT COMPARISON:  01/23/2023 FINDINGS: Status post right hip arthroplasty. Recurrent superior dislocation of the femoral component of the right hip prosthesis. No signs of acute fracture. Degenerative changes noted within the lumbar spine and left hip. IMPRESSION: Recurrent superior dislocation of the femoral component of the right hip prosthesis. Electronically Signed   By: Signa Kell M.D.   On: 02/02/2023 05:31    Assessment/Plan Principal Problem:   Dislocation of hip joint prosthesis, subsequent encounter Active Problems:   Musculoskeletal pain   Status post total replacement of right hip   Pain due to total hip replacement (HCC)   GERD (gastroesophageal reflux disease)   Chronic pain syndrome   Essential hypertension  Recurrent prosthetic right hip dislocation  - just recently had this done on 6/20 and then again on 01/24/23 - ED attempted reduction was unsuccessful - Dr. Dallas Schimke with ortho planning reduction in OR later today - continue NPO status for now  Essential hypertension  - holding home oral lisinopril 10 mg daily while NPO - IV hydralazine ordered  PRN for elevated BPs - resume home meds when able to take oral tabs again  Acute on chronic pain  - IV fentanyl ordered as needed     DVT prophylaxis: ted hoses   Code Status: full   Family Communication:   Disposition Plan: return to SNF rehab when cleared by ortho   Consults called: orthopedics Dr. Dallas Schimke   Admission status:   Level of care: Med-Surg Standley Dakins MD [facility] How to contact the [facility] Attending or Consulting provider 7A - 7P or covering provider during after hours 7P -7A, for this patient?  Check the care team in [facility] and look for a) attending/consulting [facility] provider listed and b) the [facility] team listed Log into [email or web address] and use [facility]'s universal password to access. If you do not have the password, please contact the hospital operator. Locate the [facility] provider you are looking for under [facility] and page to a number that you can be directly reached. If you still have difficulty reaching the provider, please page the [facility] (Director on Call) for the Hospitalists listed on amion for assistance.   If 7PM-7AM, please contact night-coverage [email or web address] Password [email or web address]  02/02/2023, 8:03 AM

## 2023-02-02 NOTE — Transfer of Care (Signed)
Immediate Anesthesia Transfer of Care Note  Patient: Marilyn Romero  Procedure(s) Performed: CLOSED REDUCTION HIP (Right: Hip)  Patient Location: PACU  Anesthesia Type:General  Level of Consciousness: drowsy  Airway & Oxygen Therapy: Patient Spontanous Breathing  Post-op Assessment: Report given to RN and Post -op Vital signs reviewed and stable  Post vital signs: Reviewed and stable  Last Vitals:  Vitals Value Taken Time  BP 102/62 02/02/23 1225  Temp 97.9   Pulse 73 02/02/23 1229  Resp 18 02/02/23 1229  SpO2 98 % 02/02/23 1229  Vitals shown include unvalidated device data.  Last Pain:  Vitals:   02/02/23 0938  TempSrc:   PainSc: 10-Worst pain ever         Complications: No notable events documented.

## 2023-02-03 DIAGNOSIS — T84029D Dislocation of unspecified internal joint prosthesis, subsequent encounter: Secondary | ICD-10-CM | POA: Diagnosis not present

## 2023-02-03 DIAGNOSIS — I10 Essential (primary) hypertension: Secondary | ICD-10-CM | POA: Diagnosis not present

## 2023-02-03 DIAGNOSIS — K219 Gastro-esophageal reflux disease without esophagitis: Secondary | ICD-10-CM

## 2023-02-03 DIAGNOSIS — Z96649 Presence of unspecified artificial hip joint: Secondary | ICD-10-CM | POA: Diagnosis not present

## 2023-02-03 NOTE — Discharge Instructions (Addendum)
ORTHOPEDIC INSTRUCTIONS FROM DR. CAIRNS   Weightbearing: WBAT RLE; patient must keep the knee immobilizer on at all times.  She can get up and ambulate, but she needs assistance.  She needs to be fitted for hip abduction brace before she can be discharged Incisional and dressing care: No dressing Orthopedic device(s):  Knee immobilizer; requires hip abduction brace Pain control: As needed Follow - up plan: Patient can follow-up in clinic in the next 2-4 weeks, for repeat evaluation.  She can also return to her most recent provider, who has previously discussed proceeding with a revision surgery. Contact information:     Mark A. Dallas Schimke, MD MS [facility] [address] [place],  [place]  [address] Phone: [phone] Fax: [phone]   IMPORTANT INFORMATION: PAY CLOSE ATTENTION   PHYSICIAN DISCHARGE INSTRUCTIONS  Follow with Primary care provider  Leim Fabry, MD  and other consultants as instructed by your Hospitalist Physician  SEEK MEDICAL CARE OR RETURN TO EMERGENCY ROOM IF SYMPTOMS COME BACK, WORSEN OR NEW PROBLEM DEVELOPS   Please note: You were cared for by a hospitalist during your hospital stay. Every effort will be made to forward records to your primary care provider.  You can request that your primary care provider send for your hospital records if they have not received them.  Once you are discharged, your primary care physician will handle any further medical issues. Please note that NO REFILLS for any discharge medications will be authorized once you are discharged, as it is imperative that you return to your primary care physician (or establish a relationship with a primary care physician if you do not have one) for your post hospital discharge needs so that they can reassess your need for medications and monitor your lab values.  Please get a complete blood count and chemistry panel checked by your Primary MD at your next visit, and again  as instructed by your Primary MD.  Get Medicines reviewed and adjusted: Please take all your medications with you for your next visit with your Primary MD  Laboratory/radiological data: Please request your Primary MD to go over all hospital tests and procedure/radiological results at the follow up, please ask your primary care provider to get all Hospital records sent to his/her office.  In some cases, they will be blood work, cultures and biopsy results pending at the time of your discharge. Please request that your primary care provider follow up on these results.  If you are diabetic, please bring your blood sugar readings with you to your follow up appointment with primary care.    Please call and make your follow up appointments as soon as possible.    Also Note the following: If you experience worsening of your admission symptoms, develop shortness of breath, life threatening emergency, suicidal or homicidal thoughts you must seek medical attention immediately by calling 911 or calling your MD immediately  if symptoms less severe.  You must read complete instructions/literature along with all the possible adverse reactions/side effects for all the Medicines you take and that have been prescribed to you. Take any new Medicines after you have completely understood and accpet all the possible adverse reactions/side effects.   Do not drive when taking Pain medications or sleeping medications (Benzodiazepines)  Do not take more than prescribed Pain, Sleep and Anxiety Medications. It is not advisable to combine anxiety,sleep and pain medications without talking with your primary care practitioner  Special Instructions: If you have smoked or chewed  Tobacco  in the last 2 yrs please stop smoking, stop any regular Alcohol  and or any Recreational drug use.  Wear Seat belts while driving.  Do not drive if taking any narcotic, mind altering or controlled substances or recreational drugs or  alcohol.

## 2023-02-03 NOTE — Care Management CC44 (Signed)
Condition Code 44 Documentation Completed  Patient Details  Name: Marilyn Romero MRN: [id number] Date of Birth: 03-Jun-1934   Condition Code 44 given:  Yes Patient signature on Condition Code 44 notice:  Yes Documentation of 2 MD's agreement:  Yes Code 44 added to claim:  Yes    Catalina Gravel, LCSW 02/03/2023, 12:29 PM

## 2023-02-03 NOTE — Care Management Obs Status (Signed)
MEDICARE OBSERVATION STATUS NOTIFICATION   Patient Details  Name: Marilyn Romero MRN: [id number] Date of Birth: Dec 04, 1933   Medicare Observation Status Notification Given:  Yes    Saburo Luger Marsh Dolly, LCSW 02/03/2023, 12:29 PM

## 2023-02-03 NOTE — Care Management Important Message (Signed)
Important Message  Patient Details  Name: Marilyn Romero MRN: [id number] Date of Birth: 12-Jan-1934   Medicare Important Message Given:  Yes     Garnell Begeman I Ninetta Lights, LCSW 02/03/2023, 12:34 PM

## 2023-02-03 NOTE — Progress Notes (Signed)
   ORTHOPAEDIC PROGRESS NOTE  s/p Procedure(s): CLOSED REDUCTION HIP  DOS: 02/02/23  SUBJECTIVE: No issues overnight.  She is resting comfortably.  She has not been fitted for a hip abduction brace  OBJECTIVE: PE:  Early female.  Resting comfortably.  Demonstrates some confusion.  Leg lengths are equal Knee immobilizer remains in place Toes are warm and well-perfused. Active motion in the ankle and the great toe. 2+ DP pulse  Vitals:   02/02/23 2022 02/03/23 0657  BP: (!) 124/56 (!) 123/56  Pulse: 80 76  Resp: 16 16  Temp: 98.5 F (36.9 C) 97.8 F (36.6 C)  SpO2: 97% 98%     ASSESSMENT: Marilyn Romero is a 89 y.o. female is stable following closed reduction of recurrent right hip dislocation  PLAN: Weightbearing: WBAT RLE; patient must keep the knee immobilizer on at all times.  She can get up and ambulate, but she needs assistance.  She needs to be fitted for hip abduction brace before she can be discharged Incisional and dressing care: No dressing Orthopedic device(s):  Knee immobilizer; requires hip abduction brace VTE prophylaxis: At discretion of the medicine team Pain control: As needed Follow - up plan: Patient can follow-up in clinic in the next 2-4 weeks, for repeat evaluation.  She can also return to her most recent provider, who has previously discussed proceeding with a revision surgery.   Contact information:     Ansen Sayegh A. Dallas Schimke, MD MS [facility] [address] [place],  [place]  [address] Phone: [phone] Fax: [phone]

## 2023-02-03 NOTE — Discharge Summary (Signed)
Physician Discharge Summary  Marilyn Romero ZOX:[id number] DOB: 1934/01/21 DOA: 02/02/2023  PCP: Leim Fabry, MD Orthopedics: Daine Gip date: 02/02/2023 Discharge date: 02/03/2023  Admitted From:  [facility] Disposition:  RETURN TO [facility]   Recommendations for Outpatient Follow-up:  ORTHOPEDIC INSTRUCTIONS FROM DR. CAIRNS   Weightbearing: WBAT RLE; patient must keep the knee immobilizer on at all times.  She can get up and ambulate, but she needs assistance.  She needs to be fitted for hip abduction brace before she can be discharged Incisional and dressing care: No dressing Orthopedic device(s):  Knee immobilizer; requires hip abduction brace Pain control: As needed Follow - up plan: Patient can follow-up in clinic in the next 2-4 weeks, for repeat evaluation.  She can also return to her most recent provider, who has previously discussed proceeding with a revision surgery. Contact information:    Mark A. Dallas Schimke, MD MS [facility] [address] [place],  [place]  [address] Phone: [phone] Fax: [phone]  Home Health:  SNF  Discharge Condition: STABLE   DIET: Dysphagia 2 assist with feeding, aspiration precautions  Brief Hospitalization Summary: Please see all hospital notes, images, labs for full details of the hospitalization. ADMISSION PROVIDER HPI: 89 y/o female s/p right hip replacement and several subsequent recurrent hip dislocations in past 6 months, h/o colon cancer, GERD, frequently falls, lumbar radiculopathy, OA, HLD presented to ED with recurrent right prosthetic hip dislocation.    She was just discharged from [facility] on 01/30/23 to [facility] after she had a closed reduction of her right prosthetic hip dislocation on 6/20 and then also on 6/27.  She had been a long-term resident at [facility] and plans to return after completing acute rehab.    The ED providers attempted to reduce  the dislocation and it was unsuccessful. Dr. [place] Schimke with orthopedics was consulted and he plans to reduce the dislocation in the OR.  Pt is admitted for further management.     HOSPITAL COURSE  Pt was admitted for observation after having another right prosthetic hip dislocation which she has had multiple instances in past few months.  This was unable to be reduced in ED and was taken to OR by Dr. Dallas Schimke and reduced under anesthesia successfully.  Dr. Dallas Schimke ordered special orthopedic brace that needed to be placed before she could discharge.  The hip abduction brace was delivered on 7/7.  Orthopedic reported that patient can be weightbearing as tolerated on the right lower extremity.  Patient must keep the knee immobilizer on at all times.  She can get up and ambulate but she needs assistance.  Patient can follow-up with Dr. Dallas Schimke in the orthopedic clinic in the next 2 to 4 weeks for repeat evaluation or she can return to the previous orthopedic provider that she had seen.  Discharge Diagnoses:  Principal Problem:   Dislocation of hip joint prosthesis, subsequent encounter Active Problems:   Musculoskeletal pain   Status post total replacement of right hip   Pain due to total hip replacement (HCC)   GERD (gastroesophageal reflux disease)   Chronic pain syndrome   Essential hypertension   Discharge Instructions:  Allergies as of 02/03/2023   No Known Allergies      Medication List     STOP taking these medications    HYDROcodone-acetaminophen 5-325 MG tablet Commonly known as: Norco   potassium chloride SA 20 MEQ tablet Commonly known as: Jerene Dilling  TAKE these medications    acetaminophen 325 MG tablet Commonly known as: TYLENOL Take 650 mg by mouth every 4 (four) hours as needed for headache, mild pain or fever.   Cerovite Senior Tabs Take 1 tablet by mouth daily.   diclofenac Sodium 1 % Gel Commonly known as: VOLTAREN Apply 2 g topically 4 (four) times daily.  Apply to right hip.   ferrous sulfate 325 (65 FE) MG tablet Take 325 mg by mouth daily with breakfast.   guaiFENesin 100 MG/5ML liquid Commonly known as: ROBITUSSIN Take 15 mLs by mouth every 6 (six) hours as needed for cough.   Lidocaine Pain Relief 4 % Generic drug: lidocaine Place 1 patch onto the skin daily. Apply to right hip for 12 hours in a 24 hour period.   lisinopril 10 MG tablet Commonly known as: ZESTRIL Take 10 mg by mouth daily.   magnesium hydroxide 400 MG/5ML suspension Commonly known as: MILK OF MAGNESIA Take 30 mLs by mouth daily as needed for mild constipation.        Follow-up Information     Oliver Barre, MD. Schedule an appointment as soon as possible for a visit in 2 week(s).   Specialties: Orthopedic Surgery, Sports Medicine Why: Hospital Follow Up Contact information: [address] [place] [address] [phone]                No Known Allergies Allergies as of 02/03/2023   No Known Allergies      Medication List     STOP taking these medications    HYDROcodone-acetaminophen 5-325 MG tablet Commonly known as: Norco   potassium chloride SA 20 MEQ tablet Commonly known as: KLOR-CON M       TAKE these medications    acetaminophen 325 MG tablet Commonly known as: TYLENOL Take 650 mg by mouth every 4 (four) hours as needed for headache, mild pain or fever.   Cerovite Senior Tabs Take 1 tablet by mouth daily.   diclofenac Sodium 1 % Gel Commonly known as: VOLTAREN Apply 2 g topically 4 (four) times daily. Apply to right hip.   ferrous sulfate 325 (65 FE) MG tablet Take 325 mg by mouth daily with breakfast.   guaiFENesin 100 MG/5ML liquid Commonly known as: ROBITUSSIN Take 15 mLs by mouth every 6 (six) hours as needed for cough.   Lidocaine Pain Relief 4 % Generic drug: lidocaine Place 1 patch onto the skin daily. Apply to right hip for 12 hours in a 24 hour period.   lisinopril 10 MG tablet Commonly known  as: ZESTRIL Take 10 mg by mouth daily.   magnesium hydroxide 400 MG/5ML suspension Commonly known as: MILK OF MAGNESIA Take 30 mLs by mouth daily as needed for mild constipation.        Procedures/Studies: DG Pelvis Portable  Result Date: 02/02/2023 CLINICAL DATA:  History of right hip dislocation. EXAM: PORTABLE PELVIS 1-2 VIEWS COMPARISON:  Earlier today FINDINGS: Interval relocation of the femoral component of the right hip arthroplasty device. No signs of periprosthetic fracture or subluxation. Degenerative changes are again seen in the lumbar spine and left hip. IMPRESSION: Interval relocation of the right hip arthroplasty device. Electronically Signed   By: Signa Kell M.D.   On: 02/02/2023 12:55   DG C-Arm 1-60 Min-No Report  Result Date: 02/02/2023 Fluoroscopy was utilized by the requesting physician.  No radiographic interpretation.   DG Hip [place] or [place] Pelvis 1 View Right  Result Date: 02/02/2023 CLINICAL  DATA:  Attempted reduction of right hip dislocation. EXAM: DG HIP (WITH OR WITHOUT PELVIS) 1V PORT RIGHT COMPARISON:  Earlier today FINDINGS: The patient is status post right total hip arthroplasty. There has been superior dislocation of the femoral component of the right hip prosthesis which appears unchanged from the previous exam. No periprosthetic fracture. IMPRESSION: Persistent superior dislocation of the femoral component of the right hip prosthesis. Electronically Signed   By: Signa Kell M.D.   On: 02/02/2023 05:56   DG HIP PORT UNILAT WITH PELVIS 1V RIGHT  Result Date: 02/02/2023 CLINICAL DATA:  Pain after fall. EXAM: DG HIP (WITH OR WITHOUT PELVIS) 1V PORT RIGHT COMPARISON:  01/23/2023 FINDINGS: Status post right hip arthroplasty. Recurrent superior dislocation of the femoral component of the right hip prosthesis. No signs of acute fracture. Degenerative changes noted within the lumbar spine and left hip. IMPRESSION: Recurrent superior dislocation of the  femoral component of the right hip prosthesis. Electronically Signed   By: Signa Kell M.D.   On: 02/02/2023 05:31   CT HIP RIGHT WO CONTRAST  Result Date: 01/24/2023 CLINICAL DATA:  Surgical planning for right total hip arthroplasty revision. Multiple recent right hip arthroplasty dislocations. EXAM: CT OF THE RIGHT HIP WITHOUT CONTRAST TECHNIQUE: Multidetector CT imaging of the right hip was performed according to the standard protocol. Multiplanar CT image reconstructions were also generated. RADIATION DOSE REDUCTION: This exam was performed according to the departmental dose-optimization program which includes automated exposure control, adjustment of the mA and/or kV according to patient size and/or use of iterative reconstruction technique. COMPARISON:  Right hip x-rays from same day. CT abdomen pelvis dated Dec 08, 2021. FINDINGS: Bones/Joint/Cartilage Prior right total hip arthroplasty. No evidence of hardware failure or loosening. No fracture or dislocation. No joint effusion. Ligaments Ligaments are suboptimally evaluated by CT. Muscles and Tendons Atrophy of the right piriformis and gluteus medius and minimus muscles. Soft tissue Soft tissue swelling about the right hip. No fluid collection or hematoma. No soft tissue mass. Large stool ball in the rectum. IMPRESSION: 1. Prior right total hip arthroplasty without hardware complication. Electronically Signed   By: Obie Dredge M.D.   On: 01/24/2023 19:32   DG HIP UNILAT WITH PELVIS 2-3 VIEWS RIGHT  Result Date: 01/24/2023 CLINICAL DATA:  Elective surgery.  Closed reduction right hip. EXAM: DG HIP (WITH OR WITHOUT PELVIS) 2-3V RIGHT COMPARISON:  Pelvic radiograph yesterday FINDINGS: Two fluoroscopic spot views of the right hip obtained in the operating room. The previous arthroplasty dislocation has been reduced, the femoral component is seated in the acetabular component. Fluoroscopy time 57 seconds. Dose 6.2 mGy. IMPRESSION: Intraoperative  fluoroscopy during reduction of right hip arthroplasty dislocation. Electronically Signed   By: Narda Rutherford M.D.   On: 01/24/2023 16:08   DG C-Arm 1-60 Min-No Report  Result Date: 01/24/2023 Fluoroscopy was utilized by the requesting physician.  No radiographic interpretation.   DG Pelvis 1-2 Views  Result Date: 01/23/2023 CLINICAL DATA:  Right hip prosthesis with recurrent dislocation. EXAM: PELVIS - 1-2 VIEW COMPARISON:  Similar study 01/17/2023 and subsequent postreduction spot fluoroscopic view. FINDINGS: Generalized osteopenia. Right hip arthroplasty is again noted with superolateral dislocation of the femoral component. No pelvic fracture or diastasis is seen AP. There is mild degenerative arthrosis of the SI joints, symphysis pubis and left hip. Tubular calcific plaques in the superficial femoral arteries are again shown. Degenerative change of the visualized lower lumbar spine. IMPRESSION: 1. Superolateral dislocation of the right hip prosthesis femoral component. 2.  Osteopenia and degenerative change. 3. Peripheral vascular disease. Electronically Signed   By: Almira Bar M.D.   On: 01/23/2023 20:59   DG C-Arm 1-60 Min  Result Date: 01/17/2023 CLINICAL DATA:  Close reduction of right hip EXAM: DG HIP (WITH OR WITHOUT PELVIS) 2-3V RIGHT; DG C-ARM 1-60 MIN COMPARISON:  Film from earlier in the same day. FLUOROSCOPY TIME:  Radiation Exposure Index (as provided by the fluoroscopic device): Not available If the device does not provide the exposure index: Fluoroscopy Time:  2 minutes 18 seconds Number of Acquired Images:  2 FINDINGS: Interval reduction of the femoral component into the acetabular component is noted. IMPRESSION: Status post reduction of right hip prosthesis. Electronically Signed   By: Alcide Clever M.D.   On: 01/17/2023 22:17   DG HIP UNILAT WITH PELVIS 2-3 VIEWS RIGHT  Result Date: 01/17/2023 CLINICAL DATA:  Close reduction of right hip EXAM: DG HIP (WITH OR WITHOUT  PELVIS) 2-3V RIGHT; DG C-ARM 1-60 MIN COMPARISON:  Film from earlier in the same day. FLUOROSCOPY TIME:  Radiation Exposure Index (as provided by the fluoroscopic device): Not available If the device does not provide the exposure index: Fluoroscopy Time:  2 minutes 18 seconds Number of Acquired Images:  2 FINDINGS: Interval reduction of the femoral component into the acetabular component is noted. IMPRESSION: Status post reduction of right hip prosthesis. Electronically Signed   By: Alcide Clever M.D.   On: 01/17/2023 22:17   DG Pelvis 1-2 Views  Result Date: 01/17/2023 CLINICAL DATA:  Status post attempted reduction of right hip EXAM: PELVIS - 1-2 VIEW COMPARISON:  Radiographs 01/17/2023 at 10:18 a.m. FINDINGS: No significant change from earlier today. Superior-lateral dislocated right hip arthroplasty femoral stem prosthesis. Demineralization. No acute fracture. Degenerative changes left hip, SI joints, pubic symphysis, lumbar spine. IMPRESSION: Similar dislocated right hip arthroplasty. Electronically Signed   By: Minerva Fester M.D.   On: 01/17/2023 20:37   DG Hip Unilat W or Wo Pelvis 2-3 Views Right  Result Date: 01/17/2023 CLINICAL DATA:  Pain after fall EXAM: DG HIP (WITH OR WITHOUT PELVIS) 3V RIGHT COMPARISON:  X-ray 01/02/2023 and older FINDINGS: Osteopenia. Right hip arthroplasty again identified with screw fixated acetabular cup and Press-Fit femoral stem with dislocation of the prosthesis. The femoral component is lateral and superior and posterior to the acetabular cup. Slight asymmetric appearance to the femoral stem with proximally 2 mm of lucency between the stem in the adjacent bone. Please correlate for any signs of loosening. Otherwise no fracture or dislocation. Degenerative changes seen of the visualized lumbar spine at the edge of the imaging field. IMPRESSION: Right hip arthroplasty dislocation again identified. Electronically Signed   By: Karen Kays M.D.   On: 01/17/2023 18:47      Subjective: No specific complaints.   Discharge Exam: Vitals:   02/02/23 2022 02/03/23 0657  BP: (!) 124/56 (!) 123/56  Pulse: 80 76  Resp: 16 16  Temp: 98.5 F (36.9 C) 97.8 F (36.6 C)  SpO2: 97% 98%   Vitals:   02/02/23 1230 02/02/23 1306 02/02/23 2022 02/03/23 0657  BP: (!) 109/59 (!) 156/69 (!) 124/56 (!) 123/56  Pulse: 72 70 80 76  Resp: 19 16 16 16   Temp:  97.9 F (36.6 C) 98.5 F (36.9 C) 97.8 F (36.6 C)  TempSrc:  Oral Oral Oral  SpO2: 98% 99% 97% 98%  Weight:      Height:       General: Pt is alert, awake, not in  acute distress Cardiovascular: normal S1/S2 +, no rubs, no gallops Respiratory: CTA bilaterally, no wheezing, no rhonchi Abdominal: Soft, NT, ND, bowel sounds + Extremities: fitted with orthopedic brace and knee immobilizer    The results of significant diagnostics from this hospitalization (including imaging, microbiology, ancillary and laboratory) are listed below for reference.     Microbiology: Recent Results (from the past 240 hour(s))  MRSA Next Gen by PCR, Nasal     Status: None   Collection Time: 01/25/23  7:23 AM   Specimen: Nasal Mucosa; Nasal Swab  Result Value Ref Range Status   MRSA by PCR Next Gen NOT DETECTED NOT DETECTED Final    Comment: (NOTE) The GeneXpert MRSA Assay (FDA approved for NASAL specimens only), is one component of a comprehensive MRSA colonization surveillance program. It is not intended to diagnose MRSA infection nor to guide or monitor treatment for MRSA infections. Test performance is not FDA approved in patients less than 88 years old. Performed at [facility] [address] [place], [place] [address]      Labs: BNP (last 3 results) No results for input(s): "BNP" in the last 8760 hours. Basic Metabolic Panel: Recent Labs  Lab 02/02/23 0439  NA 136  K 3.6  CL 105  CO2 23  GLUCOSE 119*  BUN 15  CREATININE 0.58  CALCIUM 9.5   Liver Function Tests: No results for input(s): "AST",  "ALT", "ALKPHOS", "BILITOT", "PROT", "ALBUMIN" in the last 168 hours. No results for input(s): "LIPASE", "AMYLASE" in the last 168 hours. No results for input(s): "AMMONIA" in the last 168 hours. CBC: Recent Labs  Lab 02/02/23 0439  WBC 6.9  HGB 12.5  HCT 39.3  MCV 90.6  PLT 367   Cardiac Enzymes: No results for input(s): "CKTOTAL", "CKMB", "CKMBINDEX", "TROPONINI" in the last 168 hours. BNP: Invalid input(s): "POCBNP" CBG: No results for input(s): "GLUCAP" in the last 168 hours. D-Dimer No results for input(s): "DDIMER" in the last 72 hours. Hgb A1c No results for input(s): "HGBA1C" in the last 72 hours. Lipid Profile No results for input(s): "CHOL", "HDL", "LDLCALC", "TRIG", "CHOLHDL", "LDLDIRECT" in the last 72 hours. Thyroid function studies No results for input(s): "TSH", "T4TOTAL", "T3FREE", "THYROIDAB" in the last 72 hours.  Invalid input(s): "FREET3" Anemia work up No results for input(s): "VITAMINB12", "FOLATE", "FERRITIN", "TIBC", "IRON", "RETICCTPCT" in the last 72 hours. Urinalysis    Component Value Date/Time   COLORURINE YELLOW (A) 12/08/2021 0722   APPEARANCEUR CLEAR (A) 12/08/2021 0722   APPEARANCEUR Cloudy 10/10/2011 0144   LABSPEC >1.046 (H) 12/08/2021 0722   LABSPEC 1.012 10/10/2011 0144   PHURINE 6.0 12/08/2021 0722   GLUCOSEU NEGATIVE 12/08/2021 0722   GLUCOSEU Negative 10/10/2011 0144   HGBUR MODERATE (A) 12/08/2021 0722   BILIRUBINUR NEGATIVE 12/08/2021 0722   BILIRUBINUR Negative 10/10/2011 0144   KETONESUR 5 (A) 12/08/2021 0722   PROTEINUR NEGATIVE 12/08/2021 0722   NITRITE NEGATIVE 12/08/2021 0722   LEUKOCYTESUR LARGE (A) 12/08/2021 0722   LEUKOCYTESUR 3+ 10/10/2011 0144   Sepsis Labs Recent Labs  Lab 02/02/23 0439  WBC 6.9   Microbiology Recent Results (from the past 240 hour(s))  MRSA Next Gen by PCR, Nasal     Status: None   Collection Time: 01/25/23  7:23 AM   Specimen: Nasal Mucosa; Nasal Swab  Result Value Ref Range Status    MRSA by PCR Next Gen NOT DETECTED NOT DETECTED Final    Comment: (NOTE) The GeneXpert MRSA Assay (FDA approved for NASAL specimens only),  is one component of a comprehensive MRSA colonization surveillance program. It is not intended to diagnose MRSA infection nor to guide or monitor treatment for MRSA infections. Test performance is not FDA approved in patients less than 81 years old. Performed at [facility], [address], [place], [place] [address]    Time coordinating discharge:  36 mins   SIGNED:  Standley Dakins, MD  [facility] 02/03/2023, 12:15 PM How to contact the [facility] Attending or Consulting provider 7A - 7P or covering provider during after hours 7P -7A, for this patient?  Check the care team in [facility] and look for a) attending/consulting TR[facility] provider listed and b) the [facility] team listed Log into [email or web address] and use [facility] universal password to access. If you do not have the password, please contact the hospital operator. Locate the [facility] provider you are looking for under [facility] and page to a number that you can be directly reached. If you still have difficulty reaching the provider, please page the [facility] (Director on Call) for the Hospitalists listed on amion for assistance.

## 2023-02-03 NOTE — TOC Transition Note (Signed)
Transition of Care [facility]) - CM/SW Discharge Note   Patient Details  Name: Marilyn Romero MRN: [id number] Date of Birth: 1933-12-08  Transition of Care (TOC) CM/SW Contact:  Catalina Gravel, LCSW Phone Number: 02/03/2023, 1:27 PM   Clinical Narrative:    Pt fitted for brace today. UR and MD advised Code 44.  Moon and Code 44 delivered at bedside.Pt aware and wants to return to [facility].  Pt wants to speak with daughter. CSW agreed to let daughter know.  CSW contacted daughter, also agree with return plan. Gave daughter info on mom wanting to talk with her, agreed to call.  [facility] did not answer.  Called sister center, spoke with Revonda Standard. Verified pt Obs not admission, and she can return today. Given room number and a call report nurse.  CSW completed Medical Necessity form, added to transport list. No other TOC needs.      Barriers to Discharge: No Barriers Identified   Patient Goals and CMS Choice      Discharge Placement                         Discharge Plan and Services Additional resources added to the After Visit Summary for                                       Social Determinants of Health (SDOH) Interventions SDOH Screenings   Food Insecurity: Patient Unable To Answer (01/24/2023)  Housing: High Risk (01/24/2023)  Transportation Needs: Patient Unable To Answer (01/24/2023)  Utilities: Patient Unable To Answer (01/24/2023)  Depression (PHQ2-9): Low Risk  (06/27/2022)  Tobacco Use: Low Risk  (02/02/2023)     Readmission Risk Interventions     No data to display

## 2023-02-03 NOTE — Progress Notes (Signed)
Orthopedic Tech Progress Note Patient Details:  Marilyn Romero May 05, 1934 [id number]  Stat order for R hip abduction brace called into [facility]. [facility] ortho techs contacted by Dr. Dallas Schimke this morning re: required brace for pt d/c and communicated that, to my knowledge, we were not previously made aware of this order.   Patient ID: Marilyn Romero, female   DOB: February 05, 1934, 89 y.o.   MRN: [id number]  Docia Furl 02/03/2023, 7:55 AM

## 2023-02-03 NOTE — Progress Notes (Signed)
Spoke with Kennyth Lose [phone]) at [facility], gave report on patient explaining she will return today.

## 2023-02-03 NOTE — Progress Notes (Signed)
Contacted the Ortho tech, at the number listed in the order, in order to ensure that the patient receives a hip abduction brace.  This has been acknowledged.  They will have her fitted as soon as possible.    Patient cannot be discharged until she is fitted for the hip abduction brace   Dominico Rod A. [place] Schimke, MD MS [facility] [address] [place],  [place]  [address] Phone: [phone] Fax: [phone]

## 2023-02-05 ENCOUNTER — Encounter (HOSPITAL_COMMUNITY): Payer: Self-pay | Admitting: Orthopedic Surgery

## 2023-02-10 ENCOUNTER — Other Ambulatory Visit: Payer: Self-pay

## 2023-02-10 ENCOUNTER — Emergency Department (HOSPITAL_COMMUNITY)
Admission: EM | Admit: 2023-02-10 | Discharge: 2023-02-11 | Disposition: A | Discharge status: Home / Self Care | Payer: 59 | Attending: Emergency Medicine | Admitting: Emergency Medicine

## 2023-02-10 ENCOUNTER — Emergency Department (HOSPITAL_COMMUNITY): Payer: 59

## 2023-02-10 ENCOUNTER — Encounter (HOSPITAL_COMMUNITY): Payer: Self-pay

## 2023-02-10 DIAGNOSIS — Y69 Unspecified misadventure during surgical and medical care: Secondary | ICD-10-CM | POA: Diagnosis not present

## 2023-02-10 DIAGNOSIS — T84020A Dislocation of internal right hip prosthesis, initial encounter: Secondary | ICD-10-CM | POA: Diagnosis not present

## 2023-02-10 DIAGNOSIS — S73034A Other anterior dislocation of right hip, initial encounter: Secondary | ICD-10-CM

## 2023-02-10 DIAGNOSIS — F0390 Unspecified dementia without behavioral disturbance: Secondary | ICD-10-CM | POA: Diagnosis not present

## 2023-02-10 LAB — BASIC METABOLIC PANEL
Anion gap: 7 (ref 5–15)
BUN: 16 mg/dL (ref 8–23)
CO2: 22 mmol/L (ref 22–32)
Calcium: 9.1 mg/dL (ref 8.9–10.3)
Chloride: 107 mmol/L (ref 98–111)
Creatinine, Ser: 0.63 mg/dL (ref 0.44–1.00)
GFR, Estimated: >60 mL/min (ref >60)
Glucose, Bld: 109 mg/dL — ABNORMAL HIGH (ref 70–99)
Potassium: 3.4 mmol/L — ABNORMAL LOW (ref 3.5–5.1)
Sodium: 136 mmol/L (ref 135–145)

## 2023-02-10 LAB — CBC WITH DIFFERENTIAL/PLATELET
Abs Immature Granulocytes: 0.01 10*3/uL (ref 0.00–0.07)
Basophils Absolute: 0.1 10*3/uL (ref 0.0–0.1)
Basophils Relative: 1 %
Eosinophils Absolute: 0.2 10*3/uL (ref 0.0–0.5)
Eosinophils Relative: 3 %
HCT: 36.8 % (ref 36.0–46.0)
Hemoglobin: 11.8 g/dL — ABNORMAL LOW (ref 12.0–15.0)
Immature Granulocytes: 0 %
Lymphocytes Relative: 21 %
Lymphs Abs: 1.4 10*3/uL (ref 0.7–4.0)
MCH: 28.9 pg (ref 26.0–34.0)
MCHC: 32.1 g/dL (ref 30.0–36.0)
MCV: 90 fL (ref 80.0–100.0)
Monocytes Absolute: 0.8 10*3/uL (ref 0.1–1.0)
Monocytes Relative: 12 %
Neutro Abs: 4.1 10*3/uL (ref 1.7–7.7)
Neutrophils Relative %: 63 %
Platelets: 376 10*3/uL (ref 150–400)
RBC: 4.09 MIL/uL (ref 3.87–5.11)
RDW: 13.9 % (ref 11.5–15.5)
WBC: 6.5 10*3/uL (ref 4.0–10.5)
nRBC: 0 % (ref 0.0–0.2)

## 2023-02-10 MED ORDER — MORPHINE SULFATE (PF) 4 MG/ML IV SOLN
4.0000 mg | Freq: Once | INTRAVENOUS | Status: AC
  Administered 2023-02-10: 4 mg via INTRAVENOUS
  Filled 2023-02-10: qty 1

## 2023-02-10 MED ORDER — PROPOFOL 10 MG/ML IV BOLUS
0.5000 mg/kg | Freq: Once | INTRAVENOUS | Status: AC
  Administered 2023-02-10: 28.6 mg via INTRAVENOUS
  Filled 2023-02-10: qty 20

## 2023-02-10 MED ORDER — FENTANYL CITRATE PF 50 MCG/ML IJ SOSY
50.0000 ug | PREFILLED_SYRINGE | Freq: Once | INTRAMUSCULAR | Status: AC
  Administered 2023-02-10: 50 ug via INTRAVENOUS
  Filled 2023-02-10: qty 1

## 2023-02-10 NOTE — ED Provider Notes (Signed)
  [place] EMERGENCY DEPARTMENT AT [facility] Provider Note   CSN: [id number] Arrival date & time: 02/10/23  1827     Marilyn Romero is a 89 y.o. female.   Procedures .Ortho Injury Treatment  Date/Time: 02/10/2023 10:17 PM  Performed by: Peter Garter, PA Authorized by: Peter Garter, PA   Consent:    Consent obtained:  Verbal and written   Consent given by:  Patient   Risks discussed:  Fracture, irreducible dislocation, recurrent dislocation, nerve damage, restricted joint movement, stiffness and vascular damage   Alternatives discussed:  No treatment and alternative treatment Universal protocol:    Procedure explained and questions answered to patient or proxy's satisfaction: yes     Relevant documents present and verified: yes     Test results available and properly labeled: yes     Imaging studies available: yes     Site/side marked: yes     Immediately prior to procedure a time out was called: yes     Patient identity confirmed:  Verbally with patient and arm bandInjury location: hip Location details: right hip Injury type: dislocation Dislocation type: posterior Spontaneous dislocation: no Prosthesis: yes Pre-procedure neurovascular assessment: neurovascularly intact Pre-procedure distal perfusion: normal Pre-procedure neurological function: normal Pre-procedure range of motion: normal  Anesthesia: Local anesthesia used: no  Patient sedated: Yes. Refer to sedation procedure documentation for details of sedation. Manipulation performed: yes Reduction method: traction and counter traction, Allis maneuver and Bigelow technique Immobilization: brace Post-procedure neurovascular assessment: post-procedure neurovascularly intact          Peter Garter, PA 02/10/23 2220    Terrilee Files, MD 02/11/23 [phone]

## 2023-02-10 NOTE — ED Triage Notes (Signed)
PT bib [facility] from [facility]. Facility stated that they was doing physical therapy with pt and they think they dislocated her right hip. Facility gave pt a hydrocodone at [facility]

## 2023-02-10 NOTE — Discharge Instructions (Signed)
You were seen in the emergency department for hip dislocation on the right.  She received procedural sedation and reduction of hip.  Please continue knee immobilizer and hip brace.  Contact orthopedic office tomorrow for close follow-up.  Return to the emergency department if any worsening or concerning symptoms

## 2023-02-10 NOTE — ED Provider Notes (Signed)
[place] EMERGENCY DEPARTMENT AT [facility] Provider Note   CSN: [id number] Arrival date & time: 02/10/23  1827     History  Chief Complaint  Patient presents with   Hip Pain         Marilyn Romero is a 89 y.o. female.  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  She has had multiple dislocations since then most recently, 7/6 that was unable to be reduced in the ED and taken to the operating room by Dr. Dallas Schimke.  She was at rehab today and reportedly was getting some physical therapy when she acutely had hip pain and there was concern that she may be redislocated.  She is in a knee immobilizer and a hip brace.  She is complaining of significant pain to her right hip.  She denies other complaints.  Level 5 caveat secondary to dementia.  The history is provided by the patient.  Hip Pain This is a recurrent problem. The problem occurs constantly. The problem has not changed since onset.Pertinent negatives include no chest pain, no abdominal pain, no headaches and no shortness of breath. Nothing aggravates the symptoms. Nothing relieves the symptoms. She has tried nothing for the symptoms. The treatment provided no relief.       Home Medications Prior to Admission medications   Medication Sig Start Date End Date Taking? Authorizing Provider  acetaminophen (TYLENOL) 325 MG tablet Take 650 mg by mouth every 4 (four) hours as needed for headache, mild pain or fever.    Keane Police, MD  diclofenac Sodium (VOLTAREN) 1 % GEL Apply 2 g topically 4 (four) times daily. Apply to right hip.    [provider]  ferrous sulfate 325 (65 FE) MG tablet Take 325 mg by mouth daily with breakfast.    Bryson Corona, NP  guaiFENesin (ROBITUSSIN) 100 MG/5ML liquid Take 15 mLs by mouth every 6 (six) hours as needed for cough.    Keane Police, MD  lidocaine (LIDOCAINE PAIN RELIEF) 4 % Place 1 patch onto the skin daily. Apply to right hip for 12  hours in a 24 hour period. 09/13/22   [provider]  lisinopril (ZESTRIL) 10 MG tablet Take 10 mg by mouth daily.    [provider]  magnesium hydroxide (MILK OF MAGNESIA) 400 MG/5ML suspension Take 30 mLs by mouth daily as needed for mild constipation.    [provider]  Multiple Vitamins-Minerals (CEROVITE SENIOR) TABS Take 1 tablet by mouth daily.    Keane Police, MD      Allergies    Patient has no known allergies.    Review of Systems   Review of Systems  Unable to perform ROS: Dementia  Respiratory:  Negative for shortness of breath.   Cardiovascular:  Negative for chest pain.  Gastrointestinal:  Negative for abdominal pain.  Neurological:  Negative for headaches.    Physical Exam Updated Vital Signs BP (!) 158/88   Pulse 72   Temp 97.6 F (36.4 C) (Oral)   Resp 16   Ht 5\' 7"  (1.702 m)   Wt 57.1 kg   SpO2 97%   BMI 19.72 kg/m  Physical Exam Vitals and nursing note reviewed.  Constitutional:      General: She is not in acute distress.    Appearance: Normal appearance. She is well-developed.  HENT:     Head: Normocephalic and atraumatic.  Eyes:     Conjunctiva/sclera: Conjunctivae normal.  Cardiovascular:  Rate and Rhythm: Normal rate and regular rhythm.     Heart sounds: No murmur heard. Pulmonary:     Effort: Pulmonary effort is normal. No respiratory distress.     Breath sounds: Normal breath sounds.  Abdominal:     Palpations: Abdomen is soft.     Tenderness: There is no abdominal tenderness.  Musculoskeletal:        General: Tenderness and deformity present.     Cervical back: Neck supple.     Comments: Right lower extremity is in a knee immobilizer and hip brace.  She has tenderness to her right hip.  Distal pulses intact.  Skin:    General: Skin is warm and dry.     Capillary Refill: Capillary refill takes less than 2 seconds.  Neurological:     General: No focal deficit present.     Mental Status: She is  alert.     Motor: No weakness.     ED Results / Procedures / Treatments   Labs (all labs ordered are listed, but only abnormal results are displayed) Labs Reviewed  BASIC METABOLIC PANEL - Abnormal; Notable for the following components:      Result Value   Potassium 3.4 (*)    Glucose, Bld 109 (*)    All other components within normal limits  CBC WITH DIFFERENTIAL/PLATELET - Abnormal; Notable for the following components:   Hemoglobin 11.8 (*)    All other components within normal limits    EKG None  Radiology DG Pelvis Portable  Result Date: 02/10/2023 CLINICAL DATA:  Relocation EXAM: PORTABLE PELVIS 1-2 VIEWS COMPARISON:  Right hip x-ray 02/10/2023 FINDINGS: Right hip arthroplasty is now in anatomic alignment. No evidence for fracture or dislocation. There is a large amount of stool in the rectum. IMPRESSION: 1. Right hip arthroplasty is now in anatomic alignment. 2. Large stool burden in the rectum. Electronically Signed   By: Darliss Cheney M.D.   On: 02/10/2023 22:57   DG Hip Unilat With Pelvis 2-3 Views Right  Result Date: 02/10/2023 CLINICAL DATA:  Hip pain EXAM: DG HIP (WITH OR WITHOUT PELVIS) 2-3V RIGHT COMPARISON:  02/02/2023 FINDINGS: Right hip replacement. Superior dislocation of the femoral component. No fracture. Partially metallic support device over right hip. IMPRESSION: Superior dislocation of the femoral component of right hip replacement. Electronically Signed   By: Jasmine Pang M.D.   On: 02/10/2023 20:27    Procedures .Sedation  Date/Time: 02/10/2023 10:12 PM  Performed by: Terrilee Files, MD Authorized by: Terrilee Files, MD   Consent:    Consent obtained:  Written   Consent given by:  Patient   Risks discussed:  Dysrhythmia, allergic reaction, inadequate sedation, nausea, respiratory compromise necessitating ventilatory assistance and intubation, prolonged hypoxia resulting in organ damage, prolonged sedation necessitating reversal and vomiting    Alternatives discussed:  Analgesia without sedation and anxiolysis Universal protocol:    Procedure explained and questions answered to patient or proxy's satisfaction: yes     Relevant documents present and verified: yes     Imaging studies available: yes     Required blood products, implants, devices, and special equipment available: yes     Immediately prior to procedure, a time out was called: yes     Patient identity confirmed:  Arm band and verbally with patient Indications:    Procedure performed:  Dislocation reduction   Procedure necessitating sedation performed by:  Physician performing sedation Pre-sedation assessment:    Time since last food or drink:  4   ASA classification: class 3 - patient with severe systemic disease     Mouth opening:  3 or more finger widths   Thyromental distance:  3 finger widths   Mallampati score:  III - soft palate, base of uvula visible   Neck mobility: normal     Pre-sedation assessments completed and reviewed: airway patency, cardiovascular function, hydration status, mental status, nausea/vomiting, pain level, respiratory function and temperature     Pre-sedation assessment completed:  02/10/2023 9:43 PM Immediate pre-procedure details:    Reassessment: Patient reassessed immediately prior to procedure     Reviewed: vital signs, relevant labs/tests and NPO status     Verified: bag valve mask available, emergency equipment available, intubation equipment available, IV patency confirmed, oxygen available and suction available   Procedure details (see MAR for exact dosages):    Preoxygenation:  Nasal cannula   Sedation:  Propofol   Intra-procedure monitoring:  Blood pressure monitoring, cardiac monitor, continuous pulse oximetry, continuous capnometry, frequent LOC assessments and frequent vital sign checks   Intra-procedure events: none     Intra-procedure management:  Airway repositioning   Total Provider sedation time (minutes):   12 Post-procedure details:    Post-sedation assessment completed:  02/10/2023 10:13 PM   Attendance: Constant attendance by certified staff until patient recovered     Recovery: Patient returned to pre-procedure baseline     Post-sedation assessments completed and reviewed: airway patency, cardiovascular function, hydration status, mental status, nausea/vomiting, pain level, respiratory function and temperature     Patient is stable for discharge or admission: yes     Procedure completion:  Tolerated well, no immediate complications     Medications Ordered in ED Medications  fentaNYL (SUBLIMAZE) injection 50 mcg (50 mcg Intravenous Given 02/10/23 1911)  morphine (PF) 4 MG/ML injection 4 mg (4 mg Intravenous Given 02/10/23 2105)  propofol (DIPRIVAN) 10 mg/mL bolus/IV push 28.6 mg (28.6 mg Intravenous Given 02/10/23 2202)    ED Course/ Medical Decision Making/ A&P Clinical Course as of 02/11/23 0831  Sun Feb 10, 2023  2245 Repeat x-ray showing hip was successfully reduced.  Will put her back in her brace.  She will be able to return back to her facility and follow-up with orthopedics as prior planned [MB]  2309 Patient was placed back in her knee immobilizer and her hip abduction brace [MB]  2309 Will return her back to facility [MB]    Clinical Course User Index [MB] Terrilee Files, MD                             Medical Decision Making Amount and/or Complexity of Data Reviewed Labs: ordered. Radiology: ordered.  Risk Prescription drug management.   This patient complains of right hip pain; this involves an extensive number of treatment Options and is a complaint that carries with it a high risk of complications and morbidity. The differential includes dislocation, fracture, contusion  I ordered, reviewed and interpreted labs, which included CBC with normal white count stable hemoglobin, chemistries with mildly low potassium I ordered medication IV pain medication, propofol  for sedation and reviewed PMP when indicated. I ordered imaging studies which included pelvis x-ray  and I independently    visualized and interpreted imaging which showed superior total hip dislocation.  Repeat imaging shows appropriate reduction Additional history obtained from EMS Previous records obtained and reviewed in epic including recent ED and orthopedic notes Cardiac monitoring reviewed, sinus rhythm  Social determinants considered, no significant barriers Critical Interventions: None  After the interventions stated above, I reevaluated the patient and found patient's pain to be improved Admission and further testing considered, no indications for admission or further workup at this time.  She is appropriately reduced and neurovascularly intact.  She has been placed back in her knee immobilizer and hip brace.  Will have her return back to her facility and follow-up with orthopedics.         Final Clinical Impression(s) / ED Diagnoses Final diagnoses:  Anterior dislocation of right hip, initial encounter [facility])    Rx / DC Orders ED Discharge Orders     None         Terrilee Files, MD 02/11/23 [phone]

## 2023-02-10 NOTE — ED Notes (Signed)
Notified [place] [facility] of patient needing transportation back to [facility] [place].  

## 2023-02-11 NOTE — ED Notes (Signed)
Daughter updated on pt status and informed of DC and transportation back to facility at some point today.

## 2023-02-11 NOTE — ED Notes (Signed)
Report given to and accepted by [facility] and the [facility] in [place].

## 2023-02-12 ENCOUNTER — Telehealth: Payer: Self-pay | Admitting: Orthopedic Surgery

## 2023-02-12 NOTE — Telephone Encounter (Signed)
Spoke w/the patient's daughter, Dorenda Pfannenstiel [phone].  She questioned the patient's postop appointment for 02/15/23 and stated that the patient is in one place right now and will not be there Friday, she'll be back at the assisted living facility in [place].  She stated they already have an appointment with a surgeon in [place] for the "real" surgery.  She asked me to cancel this appointment.  This is FYI only for you.

## 2023-02-15 ENCOUNTER — Ambulatory Visit (INDEPENDENT_AMBULATORY_CARE_PROVIDER_SITE_OTHER): Payer: 59 | Admitting: Orthopedic Surgery

## 2023-02-15 ENCOUNTER — Encounter: Payer: Self-pay | Admitting: Orthopedic Surgery

## 2023-02-15 DIAGNOSIS — T84029D Dislocation of unspecified internal joint prosthesis, subsequent encounter: Secondary | ICD-10-CM

## 2023-02-15 DIAGNOSIS — Z96649 Presence of unspecified artificial hip joint: Secondary | ICD-10-CM

## 2023-02-15 NOTE — Patient Instructions (Signed)
Follow up with Marilyn Romero in [place] to discuss revision surgery  Wear brace at all times

## 2023-02-15 NOTE — Progress Notes (Signed)
Orthopaedic Postop Note  Assessment: Marilyn Romero is a 89 y.o. 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  She is still wearing the hip abduction brace.  She should follow up with Dr. Joice Lofts, who has offered her a revision to prevent recurrent dislocation.  I cannot complete this procedure.  This has been discussed with her daughter as well.  Continue with PT, focus on WB, not ROM.  Documentation provided.  Follow up as needed.    Follow-up: Return if symptoms worsen or fail to improve. XR at next visit: NA  Subjective:  Chief Complaint  Patient presents with   Post-op Follow-up    Right hip closed reduction under anesthesia on 02/02/23 wearing brace    History of Present Illness: Marilyn Romero is a 89 y.o. female who presents following the above stated procedure.  She has some dementia.  She is in clinic with transport only today.  Since I saw her and reduced her hip, she has had another dislocation.  This was reduced in the ED.  She states that she has been wearing her brace at all times.  Hip dislocated when she was working with PT.  Review of Systems: No fevers or chills No numbness or tingling No Chest Pain No shortness of breath   Objective: There were no vitals taken for this visit.  Physical Exam:  Elderly female.  Seated in a wheelchair.  She demonstrates some confusion today.   Hip abduction brace and knee immobilizer in place.  No skin breakdown.  Toes are warm and well perfused.   Active motion to EHL and TA intact.    IMAGING: I personally ordered and reviewed the following images:  No new imaging obtained today.   Oliver Barre, MD 02/15/2023 12:14 PM

## 2023-02-20 ENCOUNTER — Telehealth: Payer: Self-pay | Admitting: Orthopedic Surgery

## 2023-02-20 NOTE — Telephone Encounter (Signed)
Dr. Dallas Schimke pt - pt's daughter Jaquelyn Sakamoto [phone] lvm stating that she was calling about an appointment that her mom had recently that she was not informed of.  She would like to know what was discussed at her appointment.

## 2023-02-20 NOTE — Telephone Encounter (Signed)
Spoke with pt daughter and let her know pt had f/u with the surgeon who did her hip surgery and things done during visit. Verbalized understanding no further questions or concerns.

## 2023-02-25 ENCOUNTER — Emergency Department (HOSPITAL_COMMUNITY): Payer: 59

## 2023-02-25 ENCOUNTER — Other Ambulatory Visit: Payer: Self-pay

## 2023-02-25 ENCOUNTER — Emergency Department (HOSPITAL_COMMUNITY)
Admission: EM | Admit: 2023-02-25 | Discharge: 2023-02-26 | Disposition: A | Discharge status: Other Acute Inpatient Hospital | Payer: 59 | Attending: Emergency Medicine | Admitting: Emergency Medicine

## 2023-02-25 ENCOUNTER — Encounter (HOSPITAL_COMMUNITY): Payer: Self-pay | Admitting: Emergency Medicine

## 2023-02-25 DIAGNOSIS — Z96641 Presence of right artificial hip joint: Secondary | ICD-10-CM | POA: Diagnosis not present

## 2023-02-25 DIAGNOSIS — X58XXXA Exposure to other specified factors, initial encounter: Secondary | ICD-10-CM | POA: Insufficient documentation

## 2023-02-25 DIAGNOSIS — E876 Hypokalemia: Secondary | ICD-10-CM | POA: Insufficient documentation

## 2023-02-25 DIAGNOSIS — T84020A Dislocation of internal right hip prosthesis, initial encounter: Secondary | ICD-10-CM | POA: Diagnosis not present

## 2023-02-25 DIAGNOSIS — Z85038 Personal history of other malignant neoplasm of large intestine: Secondary | ICD-10-CM | POA: Diagnosis not present

## 2023-02-25 DIAGNOSIS — Z85118 Personal history of other malignant neoplasm of bronchus and lung: Secondary | ICD-10-CM | POA: Insufficient documentation

## 2023-02-25 DIAGNOSIS — Z79899 Other long term (current) drug therapy: Secondary | ICD-10-CM | POA: Insufficient documentation

## 2023-02-25 DIAGNOSIS — S73004A Unspecified dislocation of right hip, initial encounter: Secondary | ICD-10-CM

## 2023-02-25 DIAGNOSIS — S79911A Unspecified injury of right hip, initial encounter: Secondary | ICD-10-CM | POA: Diagnosis present

## 2023-02-25 LAB — BASIC METABOLIC PANEL
Anion gap: 6 (ref 5–15)
BUN: 16 mg/dL (ref 8–23)
CO2: 25 mmol/L (ref 22–32)
Calcium: 9 mg/dL (ref 8.9–10.3)
Chloride: 104 mmol/L (ref 98–111)
Creatinine, Ser: 0.6 mg/dL (ref 0.44–1.00)
GFR, Estimated: >60 mL/min (ref >60)
Glucose, Bld: 113 mg/dL — ABNORMAL HIGH (ref 70–99)
Potassium: 3 mmol/L — ABNORMAL LOW (ref 3.5–5.1)
Sodium: 135 mmol/L (ref 135–145)

## 2023-02-25 LAB — CBC WITH DIFFERENTIAL/PLATELET
Abs Immature Granulocytes: 0.02 10*3/uL (ref 0.00–0.07)
Basophils Absolute: 0.1 10*3/uL (ref 0.0–0.1)
Basophils Relative: 1 %
Eosinophils Absolute: 0.2 10*3/uL (ref 0.0–0.5)
Eosinophils Relative: 2 %
HCT: 39.4 % (ref 36.0–46.0)
Hemoglobin: 12.6 g/dL (ref 12.0–15.0)
Immature Granulocytes: 0 %
Lymphocytes Relative: 18 %
Lymphs Abs: 1.4 10*3/uL (ref 0.7–4.0)
MCH: 29 pg (ref 26.0–34.0)
MCHC: 32 g/dL (ref 30.0–36.0)
MCV: 90.6 fL (ref 80.0–100.0)
Monocytes Absolute: 0.8 10*3/uL (ref 0.1–1.0)
Monocytes Relative: 11 %
Neutro Abs: 5.3 10*3/uL (ref 1.7–7.7)
Neutrophils Relative %: 68 %
Platelets: 343 10*3/uL (ref 150–400)
RBC: 4.35 MIL/uL (ref 3.87–5.11)
RDW: 13.7 % (ref 11.5–15.5)
WBC: 7.7 10*3/uL (ref 4.0–10.5)
nRBC: 0 % (ref 0.0–0.2)

## 2023-02-25 MED ORDER — FENTANYL CITRATE PF 50 MCG/ML IJ SOSY
50.0000 ug | PREFILLED_SYRINGE | Freq: Once | INTRAMUSCULAR | Status: AC
  Administered 2023-02-25: 50 ug via INTRAVENOUS
  Filled 2023-02-25: qty 1

## 2023-02-25 MED ORDER — PROPOFOL 10 MG/ML IV BOLUS
30.0000 mg | Freq: Once | INTRAVENOUS | Status: AC
  Administered 2023-02-25: 45 mg via INTRAVENOUS
  Filled 2023-02-25: qty 20

## 2023-02-25 MED ORDER — FENTANYL CITRATE PF 50 MCG/ML IJ SOSY
25.0000 ug | PREFILLED_SYRINGE | Freq: Once | INTRAMUSCULAR | Status: AC
  Administered 2023-02-25: 25 ug via INTRAVENOUS
  Filled 2023-02-25: qty 1

## 2023-02-25 MED ORDER — POTASSIUM CHLORIDE CRYS ER 20 MEQ PO TBCR
20.0000 meq | EXTENDED_RELEASE_TABLET | Freq: Once | ORAL | Status: AC
  Administered 2023-02-25: 20 meq via ORAL
  Filled 2023-02-25: qty 1

## 2023-02-25 MED ORDER — POTASSIUM CHLORIDE 10 MEQ/100ML IV SOLN
10.0000 meq | INTRAVENOUS | Status: AC
  Administered 2023-02-25 (×3): 10 meq via INTRAVENOUS
  Filled 2023-02-25 (×3): qty 100

## 2023-02-25 NOTE — ED Provider Notes (Signed)
Marilyn Romero Provider Note   CSN: [id number] Arrival date & time: 02/25/23  1722     History  Chief Complaint  Patient presents with   Hip Pain    Marilyn Romero is a 89 y.o. female.  PMH of colon cancer, right hip replacement with chronic pain and recurrent dislocations. Marilyn Romero the ER today after recurrent right hip dislocation, states she stood up and it popped out.  He was supposed to wear an abduction brace, she maintains her right knee in the immobilizer. She denies head injury or loss of consciousness or dizziness or other symptoms.  Hip Pain       Home Medications Prior to Admission medications   Medication Sig Start Date End Date Taking? Authorizing Provider  acetaminophen (TYLENOL) 325 MG tablet Take 650 mg by mouth every 4 (four) hours as needed for headache, mild pain or fever.    Keane Police, MD  diclofenac Sodium (VOLTAREN) 1 % GEL Apply 2 g topically 4 (four) times daily. Apply to right hip.    [provider]  ferrous sulfate 325 (65 FE) MG tablet Take 325 mg by mouth daily with breakfast.    Marilyn Corona, NP  guaiFENesin (ROBITUSSIN) 100 MG/5ML liquid Take 15 mLs by mouth every 6 (six) hours as needed for cough.    Keane Police, MD  HYDROcodone-acetaminophen (NORCO/VICODIN) 5-325 MG tablet Take 1 tablet by mouth every 6 (six) hours as needed. 02/04/23   [provider]  lidocaine (LIDOCAINE PAIN RELIEF) 4 % Place 1 patch onto the skin daily. Apply to right hip for 12 hours in a 24 hour period. Patient not taking: Reported on 02/15/2023 09/13/22   [provider]  lisinopril (ZESTRIL) 10 MG tablet Take 10 mg by mouth daily.    [provider]  magnesium hydroxide (MILK OF MAGNESIA) 400 MG/5ML suspension Take 30 mLs by mouth daily as needed for mild constipation.    [provider]  mirtazapine (REMERON) 7.5 MG tablet Take 7.5 mg by mouth at bedtime.  02/12/23   [provider]  Multiple Vitamins-Minerals (CEROVITE SENIOR) TABS Take 1 tablet by mouth daily.    Keane Police, MD      Allergies    Patient has no known allergies.    Review of Systems   Review of Systems  Physical Exam Updated Vital Signs BP (!) 145/70   Pulse 65   Temp 98 F (36.7 C) (Oral)   Resp (!) 25   Ht 5\' 7"  (1.702 m)   Wt 57 kg   SpO2 97%   BMI 19.68 kg/m  Physical Exam Vitals and nursing note reviewed.  Constitutional:      General: She is not in acute distress.    Appearance: She is well-developed.  HENT:     Head: Normocephalic and atraumatic.     Mouth/Throat:     Mouth: Mucous membranes are moist.  Eyes:     Conjunctiva/sclera: Conjunctivae normal.  Cardiovascular:     Rate and Rhythm: Normal rate and regular rhythm.     Heart sounds: No murmur heard. Pulmonary:     Effort: Pulmonary effort is normal. No respiratory distress.     Breath sounds: Normal breath sounds.  Abdominal:     Palpations: Abdomen is soft.     Tenderness: There is no abdominal tenderness.  Musculoskeletal:        General: No swelling.     Cervical back: Neck supple.  Comments: Right hip is internally rotated and shortened  Skin:    General: Skin is warm and dry.     Capillary Refill: Capillary refill takes less than 2 seconds.  Neurological:     General: No focal deficit present.     Mental Status: She is alert and oriented to person, place, and time.  Psychiatric:        Mood and Affect: Mood normal.     ED Results / Procedures / Treatments   Labs (all labs ordered are listed, but only abnormal results are displayed) Labs Reviewed  BASIC METABOLIC PANEL - Abnormal; Notable for the following components:      Result Value   Potassium 3.0 (*)    Glucose, Bld 113 (*)    All other components within normal limits  CBC WITH DIFFERENTIAL/PLATELET    EKG None  Radiology DG Pelvis Portable  Result Date: 02/25/2023 CLINICAL DATA:   Status post reduction of right hip. EXAM: PORTABLE PELVIS 1-2 VIEWS COMPARISON:  Radiograph earlier today FINDINGS: The arthroplasty dislocation is not reduced. Persistent superior dislocation of the femoral component of right hip arthroplasty. Stable positioning of the acetabular cup. No fracture is seen. Distal aspect of the femoral stem is not included in this field of view. IMPRESSION: Persistent superior dislocation of the femoral component of right hip arthroplasty. Electronically Signed   By: Marilyn Romero M.D.   On: 02/25/2023 20:55   DG Hip Unilat W or Wo Pelvis 2-3 Views Right  Result Date: 02/25/2023 CLINICAL DATA:  Right hip dislocation, pain EXAM: DG HIP (WITH OR WITHOUT PELVIS) 2-3V RIGHT COMPARISON:  02/10/2023 FINDINGS: Frontal view of the pelvis as well as frontal and cross-table lateral views of the right hip are obtained. There is superior dislocation of the femoral component of the right hip arthroplasty relative to the acetabular component. No evidence of fracture. Bones are diffusely osteopenic. IMPRESSION: 1. Superior dislocation of the right hip arthroplasty. No acute fracture. Electronically Signed   By: Marilyn Romero M.D.   On: 02/25/2023 19:37    Procedures .Ortho Injury Treatment  Date/Time: 02/25/2023 10:46 PM  Performed by: Ma Rings, PA-C Authorized by: Ma Rings, PA-C   Consent:    Consent obtained:  Written   Consent given by:  Patient   Risks discussed:  Fracture, nerve damage, restricted joint movement, vascular damage, irreducible dislocation, recurrent dislocation and stiffness   Alternatives discussed:  ReferralInjury location: hip Location details: right hip Injury type: dislocation Spontaneous dislocation: yes Prosthesis: yes Pre-procedure distal perfusion: normal Pre-procedure neurological function: normal Pre-procedure range of motion: reduced  Patient sedated: Yes. Refer to sedation procedure documentation for details of  sedation. Manipulation performed: yes Reduction method: Allis maneuver, traction and counter traction and Whistler maneuver Reduction successful: no Post-procedure neurovascular assessment: post-procedure neurovascularly intact Post-procedure distal perfusion: normal Post-procedure neurological function: normal Post-procedure range of motion: unchanged       Medications Ordered in ED Medications  potassium chloride 10 mEq in 100 mL IVPB (10 mEq Intravenous New Bag/Given 02/25/23 2158)  propofol (DIPRIVAN) 10 mg/mL bolus/IV push 30 mg (45 mg Intravenous Given 02/25/23 2014)  fentaNYL (SUBLIMAZE) injection 25 mcg (25 mcg Intravenous Given 02/25/23 2009)  fentaNYL (SUBLIMAZE) injection 50 mcg (50 mcg Intravenous Given 02/25/23 2158)  potassium chloride SA (KLOR-CON M) CR tablet 20 mEq (20 mEq Oral Given 02/25/23 2235)    ED Course/ Medical Decision Making/ A&P  Medical Decision Making This patient presents to the ED for concern of right hip pain likely dislocation, this involves an extensive number of treatment options, and is a complaint that carries with it a high risk of complications and morbidity.  The differential diagnosis includes fracture dislocation, contusion, septic arthritis, other   Co morbidities that complicate the patient evaluation :   Lung cancer, degenerative disc disease, GERD   Additional history obtained:  Additional history obtained from EMR External records from outside source obtained and reviewed including notes, labs   Lab Tests:  I Ordered, and personally interpreted labs.  The pertinent results include: Notable for potassium of 3.0 otherwise normal CBC and BMP   Imaging Studies ordered:  I ordered imaging studies including x-ray right hip and pelvis I independently visualized and interpreted imaging which showed appear dislocation of the hip on right I agree with the radiologist interpretation   Cardiac Monitoring: /  EKG:  The patient was maintained on a cardiac monitor.  I personally viewed and interpreted the cardiac monitored which showed an underlying rhythm of: Sinus rhythm   Consultations Obtained:  I spoke with Dr. Aundria Rud the on-call orthopedic surgeon due to inability to reduce patient's hip in the ED despite adequate sedation.  Unfortunately and no orthopedics available at [facility] at this time.  He states it would be several days until patient was able to be taken to the OR for reduction at [facility] or [facility].  As patient is following up also with [place] who is going to be doing her revision he recommended reaching out to the surgeon on-call who is seen her in the past to see if they would take care of her there.  Discussed with patient who she and her daughter would prefer they be taking care of at [facility] long for this issue since that is where they follow.   Spoke with Dr. Joice Lofts the on-call orthopedic [place].  He is agreeable with bringing the patient to [facility] ED ED and they will admit her to hospitalist service and orthopedics will consult with plan to take her to the OR tomorrow.  I spoke with Dr. Sherral Hammers in the ED physician who is willing to accept patient in transfer ED to ED.   Problem List / ED Course / Critical interventions / Medication management  Right hip dislocation-not able to be reduced in the ED despite adequate sedation.  As above will transfer to [facility] where she is followed to expedite reduction in the OR.  Patient and her daughter would prefer transfer to [facility].  I ordered medication including fentanyl for pain Reevaluation of the patient after these medicines showed that the patient improved I have reviewed the patients home medicines and have made adjustments as needed   Social Determinants of Health:  Patient lives alone       Amount and/or Complexity of Data Reviewed Labs: ordered. Radiology: ordered.  Risk Prescription drug  management.           Final Clinical Impression(s) / ED Diagnoses Final diagnoses:  None    Rx / DC Orders ED Discharge Orders     None         Josem Kaufmann 02/25/23 2248    Bethann Berkshire, MD 02/27/23 1439

## 2023-02-25 NOTE — ED Triage Notes (Signed)
Pt did not have brace on right hip and believes the right hip is dislocated. Pt has history of the same. Ems gave pt of fentanyl enroute to hospital.

## 2023-02-25 NOTE — ED Notes (Signed)
Pts daughter Marilyn Romero called back. Updated in plan to transfer to [facility] ED. Pt spoke to her daughter.

## 2023-02-25 NOTE — ED Notes (Signed)
Attempted to call pt's daughter [name] per pt request regarding transfer to [facility] ED. Unable to reach or leave VM. Pt updated.

## 2023-02-26 ENCOUNTER — Inpatient Hospital Stay: Payer: 59

## 2023-02-26 ENCOUNTER — Encounter
Admission: EM | Disposition: A | Discharge status: Skilled Nursing Facility | Payer: Self-pay | Source: Other Acute Inpatient Hospital | Attending: Internal Medicine

## 2023-02-26 ENCOUNTER — Inpatient Hospital Stay
Admission: EM | Admit: 2023-02-26 | Discharge: 2023-03-04 | DRG: 467 | Disposition: A | Discharge status: Skilled Nursing Facility | Payer: 59 | Source: Other Acute Inpatient Hospital | Attending: Internal Medicine | Admitting: Internal Medicine

## 2023-02-26 ENCOUNTER — Encounter: Payer: Self-pay | Admitting: Emergency Medicine

## 2023-02-26 ENCOUNTER — Ambulatory Visit: Admission: RE | Admit: 2023-02-26 | Payer: 59 | Source: Intra-hospital | Admitting: Surgery

## 2023-02-26 DIAGNOSIS — I10 Essential (primary) hypertension: Secondary | ICD-10-CM | POA: Diagnosis present

## 2023-02-26 DIAGNOSIS — K5900 Constipation, unspecified: Secondary | ICD-10-CM | POA: Diagnosis present

## 2023-02-26 DIAGNOSIS — R011 Cardiac murmur, unspecified: Secondary | ICD-10-CM | POA: Diagnosis present

## 2023-02-26 DIAGNOSIS — R410 Disorientation, unspecified: Secondary | ICD-10-CM | POA: Diagnosis not present

## 2023-02-26 DIAGNOSIS — M51369 Other intervertebral disc degeneration, lumbar region without mention of lumbar back pain or lower extremity pain: Secondary | ICD-10-CM | POA: Diagnosis present

## 2023-02-26 DIAGNOSIS — D72829 Elevated white blood cell count, unspecified: Secondary | ICD-10-CM | POA: Diagnosis present

## 2023-02-26 DIAGNOSIS — R29898 Other symptoms and signs involving the musculoskeletal system: Secondary | ICD-10-CM | POA: Insufficient documentation

## 2023-02-26 DIAGNOSIS — Z9049 Acquired absence of other specified parts of digestive tract: Secondary | ICD-10-CM | POA: Diagnosis not present

## 2023-02-26 DIAGNOSIS — E876 Hypokalemia: Secondary | ICD-10-CM | POA: Diagnosis present

## 2023-02-26 DIAGNOSIS — G8929 Other chronic pain: Secondary | ICD-10-CM | POA: Diagnosis present

## 2023-02-26 DIAGNOSIS — M85851 Other specified disorders of bone density and structure, right thigh: Secondary | ICD-10-CM | POA: Diagnosis present

## 2023-02-26 DIAGNOSIS — G4700 Insomnia, unspecified: Secondary | ICD-10-CM | POA: Diagnosis present

## 2023-02-26 DIAGNOSIS — Z8261 Family history of arthritis: Secondary | ICD-10-CM

## 2023-02-26 DIAGNOSIS — Z803 Family history of malignant neoplasm of breast: Secondary | ICD-10-CM

## 2023-02-26 DIAGNOSIS — Y792 Prosthetic and other implants, materials and accessory orthopedic devices associated with adverse incidents: Secondary | ICD-10-CM | POA: Diagnosis present

## 2023-02-26 DIAGNOSIS — K219 Gastro-esophageal reflux disease without esophagitis: Secondary | ICD-10-CM | POA: Diagnosis present

## 2023-02-26 DIAGNOSIS — Y838 Other surgical procedures as the cause of abnormal reaction of the patient, or of later complication, without mention of misadventure at the time of the procedure: Secondary | ICD-10-CM | POA: Diagnosis present

## 2023-02-26 DIAGNOSIS — Z8249 Family history of ischemic heart disease and other diseases of the circulatory system: Secondary | ICD-10-CM | POA: Diagnosis not present

## 2023-02-26 DIAGNOSIS — M5416 Radiculopathy, lumbar region: Secondary | ICD-10-CM | POA: Diagnosis present

## 2023-02-26 DIAGNOSIS — Z66 Do not resuscitate: Secondary | ICD-10-CM | POA: Diagnosis present

## 2023-02-26 DIAGNOSIS — F0390 Unspecified dementia without behavioral disturbance: Secondary | ICD-10-CM | POA: Diagnosis present

## 2023-02-26 DIAGNOSIS — Z79899 Other long term (current) drug therapy: Secondary | ICD-10-CM

## 2023-02-26 DIAGNOSIS — M24451 Recurrent dislocation, right hip: Secondary | ICD-10-CM | POA: Diagnosis present

## 2023-02-26 DIAGNOSIS — M5136 Other intervertebral disc degeneration, lumbar region: Secondary | ICD-10-CM | POA: Diagnosis present

## 2023-02-26 DIAGNOSIS — D62 Acute posthemorrhagic anemia: Secondary | ICD-10-CM | POA: Diagnosis present

## 2023-02-26 DIAGNOSIS — Z85038 Personal history of other malignant neoplasm of large intestine: Secondary | ICD-10-CM | POA: Diagnosis not present

## 2023-02-26 DIAGNOSIS — Z8719 Personal history of other diseases of the digestive system: Secondary | ICD-10-CM

## 2023-02-26 DIAGNOSIS — M25351 Other instability, right hip: Secondary | ICD-10-CM | POA: Diagnosis present

## 2023-02-26 DIAGNOSIS — R531 Weakness: Secondary | ICD-10-CM | POA: Diagnosis present

## 2023-02-26 DIAGNOSIS — S73034A Other anterior dislocation of right hip, initial encounter: Principal | ICD-10-CM

## 2023-02-26 DIAGNOSIS — K5909 Other constipation: Secondary | ICD-10-CM | POA: Diagnosis not present

## 2023-02-26 DIAGNOSIS — T84020A Dislocation of internal right hip prosthesis, initial encounter: Secondary | ICD-10-CM | POA: Diagnosis present

## 2023-02-26 DIAGNOSIS — D649 Anemia, unspecified: Secondary | ICD-10-CM | POA: Diagnosis not present

## 2023-02-26 DIAGNOSIS — F05 Delirium due to known physiological condition: Secondary | ICD-10-CM | POA: Diagnosis not present

## 2023-02-26 HISTORY — PX: HIP CLOSED REDUCTION: SHX983

## 2023-02-26 LAB — BASIC METABOLIC PANEL
Anion gap: 7 (ref 5–15)
BUN: 14 mg/dL (ref 8–23)
CO2: 23 mmol/L (ref 22–32)
Calcium: 8.9 mg/dL (ref 8.9–10.3)
Chloride: 108 mmol/L (ref 98–111)
Creatinine, Ser: 0.6 mg/dL (ref 0.44–1.00)
GFR, Estimated: >60 mL/min (ref >60)
Glucose, Bld: 101 mg/dL — ABNORMAL HIGH (ref 70–99)
Potassium: 4.1 mmol/L (ref 3.5–5.1)
Sodium: 138 mmol/L (ref 135–145)

## 2023-02-26 LAB — CBC
HCT: 36.3 % (ref 36.0–46.0)
Hemoglobin: 11.8 g/dL — ABNORMAL LOW (ref 12.0–15.0)
MCH: 28.9 pg (ref 26.0–34.0)
MCHC: 32.5 g/dL (ref 30.0–36.0)
MCV: 89 fL (ref 80.0–100.0)
Platelets: 292 10*3/uL (ref 150–400)
RBC: 4.08 MIL/uL (ref 3.87–5.11)
RDW: 13.6 % (ref 11.5–15.5)
WBC: 8.6 10*3/uL (ref 4.0–10.5)
nRBC: 0 % (ref 0.0–0.2)

## 2023-02-26 LAB — C-REACTIVE PROTEIN: CRP: 0.5 mg/dL (ref <1.0)

## 2023-02-26 LAB — SEDIMENTATION RATE: Sed Rate: 25 mm/hr (ref 0–30)

## 2023-02-26 SURGERY — CLOSED REDUCTION, HIP
Anesthesia: General | Site: Hip | Laterality: Right

## 2023-02-26 MED ORDER — ONDANSETRON HCL 4 MG PO TABS: 4.0000 mg | ORAL_TABLET | Freq: Four times a day (QID) | ORAL | Status: DC | PRN

## 2023-02-26 MED ORDER — BISACODYL 10 MG RE SUPP: 10.0000 mg | Freq: Every day | RECTAL | Status: DC | PRN

## 2023-02-26 MED ORDER — DOCUSATE SODIUM 100 MG PO CAPS
100.0000 mg | ORAL_CAPSULE | Freq: Two times a day (BID) | ORAL | Status: DC
  Administered 2023-02-26 – 2023-02-27 (×4): 100 mg via ORAL
  Filled 2023-02-26 (×4): qty 1

## 2023-02-26 MED ORDER — ONDANSETRON HCL 4 MG/2ML IJ SOLN: 4.0000 mg | Freq: Four times a day (QID) | INTRAMUSCULAR | Status: DC | PRN

## 2023-02-26 MED ORDER — HYDROCODONE-ACETAMINOPHEN 5-325 MG PO TABS: 1.0000 | ORAL_TABLET | Freq: Four times a day (QID) | ORAL | Status: DC | PRN

## 2023-02-26 MED ORDER — MAGNESIUM HYDROXIDE 400 MG/5ML PO SUSP
30.0000 mL | Freq: Every day | ORAL | Status: DC | PRN
  Administered 2023-03-03 – 2023-03-04 (×2): 30 mL via ORAL
  Filled 2023-02-26 (×2): qty 30

## 2023-02-26 MED ORDER — FENTANYL CITRATE (PF) 100 MCG/2ML IJ SOLN: 25.0000 ug | INTRAMUSCULAR | Status: DC | PRN

## 2023-02-26 MED ORDER — METOCLOPRAMIDE HCL 5 MG/ML IJ SOLN: 5.0000 mg | Freq: Three times a day (TID) | INTRAMUSCULAR | Status: DC | PRN

## 2023-02-26 MED ORDER — ACETAMINOPHEN 325 MG PO TABS: 650.0000 mg | ORAL_TABLET | Freq: Four times a day (QID) | ORAL | Status: DC | PRN

## 2023-02-26 MED ORDER — GUAIFENESIN 100 MG/5ML PO LIQD: 15.0000 mL | Freq: Four times a day (QID) | ORAL | Status: DC | PRN

## 2023-02-26 MED ORDER — SODIUM CHLORIDE 0.9 % IV SOLN: INTRAVENOUS | Status: DC

## 2023-02-26 MED ORDER — LISINOPRIL 10 MG PO TABS
10.0000 mg | ORAL_TABLET | Freq: Every day | ORAL | Status: DC
  Administered 2023-02-27 – 2023-03-03 (×4): 10 mg via ORAL
  Filled 2023-02-26 (×4): qty 1

## 2023-02-26 MED ORDER — DROPERIDOL 2.5 MG/ML IJ SOLN: 0.6250 mg | Freq: Once | INTRAMUSCULAR | Status: DC | PRN

## 2023-02-26 MED ORDER — MORPHINE SULFATE (PF) 2 MG/ML IV SOLN: 2.0000 mg | INTRAVENOUS | Status: DC | PRN

## 2023-02-26 MED ORDER — ADULT MULTIVITAMIN W/MINERALS CH
1.0000 | ORAL_TABLET | Freq: Every day | ORAL | Status: DC
  Administered 2023-02-27 – 2023-03-04 (×5): 1 via ORAL
  Filled 2023-02-26 (×5): qty 1

## 2023-02-26 MED ORDER — CHLORHEXIDINE GLUCONATE 0.12 % MT SOLN: 15.0000 mL | Freq: Once | OROMUCOSAL | Status: DC

## 2023-02-26 MED ORDER — MORPHINE SULFATE (PF) 4 MG/ML IV SOLN
4.0000 mg | Freq: Once | INTRAVENOUS | Status: AC
  Administered 2023-02-26: 4 mg via INTRAVENOUS
  Filled 2023-02-26: qty 1

## 2023-02-26 MED ORDER — LACTATED RINGERS IV SOLN: INTRAVENOUS | Status: DC

## 2023-02-26 MED ORDER — ORAL CARE MOUTH RINSE
15.0000 mL | Freq: Once | OROMUCOSAL | Status: DC
  Filled 2023-02-26: qty 15

## 2023-02-26 MED ORDER — METOCLOPRAMIDE HCL 5 MG PO TABS: 5.0000 mg | ORAL_TABLET | Freq: Three times a day (TID) | ORAL | Status: DC | PRN

## 2023-02-26 MED ORDER — EPHEDRINE SULFATE (PRESSORS) 50 MG/ML IJ SOLN
INTRAMUSCULAR | Status: DC | PRN
  Administered 2023-02-26 (×2): 5 mg via INTRAVENOUS

## 2023-02-26 MED ORDER — TRAZODONE HCL 50 MG PO TABS
25.0000 mg | ORAL_TABLET | Freq: Every evening | ORAL | Status: DC | PRN
  Filled 2023-02-26: qty 1

## 2023-02-26 MED ORDER — PROMETHAZINE HCL 25 MG/ML IJ SOLN: 6.2500 mg | INTRAMUSCULAR | Status: DC | PRN

## 2023-02-26 MED ORDER — DIPHENHYDRAMINE HCL 12.5 MG/5ML PO ELIX: 12.5000 mg | ORAL_SOLUTION | ORAL | Status: DC | PRN

## 2023-02-26 MED ORDER — FERROUS SULFATE 325 (65 FE) MG PO TABS
325.0000 mg | ORAL_TABLET | Freq: Every day | ORAL | Status: DC
  Administered 2023-02-27 – 2023-03-04 (×5): 325 mg via ORAL
  Filled 2023-02-26 (×5): qty 1

## 2023-02-26 MED ORDER — LACTATED RINGERS IV SOLN: INTRAVENOUS | Status: DC | PRN

## 2023-02-26 MED ORDER — FENTANYL CITRATE PF 50 MCG/ML IJ SOSY
50.0000 ug | PREFILLED_SYRINGE | Freq: Once | INTRAMUSCULAR | Status: AC
  Administered 2023-02-26: 50 ug via INTRAVENOUS
  Filled 2023-02-26: qty 1

## 2023-02-26 MED ORDER — ACETAMINOPHEN 10 MG/ML IV SOLN: 1000.0000 mg | Freq: Once | INTRAVENOUS | Status: DC | PRN

## 2023-02-26 MED ORDER — OXYCODONE HCL 5 MG/5ML PO SOLN: 5.0000 mg | Freq: Once | ORAL | Status: DC | PRN

## 2023-02-26 MED ORDER — ACETAMINOPHEN 325 MG PO TABS: 325.0000 mg | ORAL_TABLET | Freq: Four times a day (QID) | ORAL | Status: DC | PRN

## 2023-02-26 MED ORDER — MIRTAZAPINE 15 MG PO TABS
7.5000 mg | ORAL_TABLET | Freq: Every day | ORAL | Status: DC
  Administered 2023-02-26 – 2023-03-03 (×6): 7.5 mg via ORAL
  Filled 2023-02-26 (×6): qty 1

## 2023-02-26 MED ORDER — PROPOFOL 500 MG/50ML IV EMUL
INTRAVENOUS | Status: DC | PRN
  Administered 2023-02-26: 40 ug via INTRAVENOUS
  Administered 2023-02-26 (×3): 20 ug via INTRAVENOUS

## 2023-02-26 MED ORDER — FLEET ENEMA 7-19 GM/118ML RE ENEM: 1.0000 | ENEMA | Freq: Once | RECTAL | Status: DC | PRN

## 2023-02-26 MED ORDER — MAGNESIUM HYDROXIDE 400 MG/5ML PO SUSP: 30.0000 mL | Freq: Every day | ORAL | Status: DC | PRN

## 2023-02-26 MED ORDER — ENOXAPARIN SODIUM 40 MG/0.4ML IJ SOSY
40.0000 mg | PREFILLED_SYRINGE | INTRAMUSCULAR | Status: DC
  Administered 2023-02-27: 40 mg via SUBCUTANEOUS
  Filled 2023-02-26: qty 0.4

## 2023-02-26 MED ORDER — OXYCODONE HCL 5 MG PO TABS: 5.0000 mg | ORAL_TABLET | Freq: Once | ORAL | Status: DC | PRN

## 2023-02-26 MED ORDER — ACETAMINOPHEN 650 MG RE SUPP: 650.0000 mg | Freq: Four times a day (QID) | RECTAL | Status: DC | PRN

## 2023-02-26 SURGICAL SUPPLY — 13 items
GAUZE 4X4 16PLY ~~LOC~~+RFID DBL (SPONGE) ×1 IMPLANT
GLOVE BIO SURGEON STRL SZ8 (GLOVE) ×2 IMPLANT
GOWN STRL REUS W/ TWL LRG LVL3 (GOWN DISPOSABLE) ×1 IMPLANT
GOWN STRL REUS W/ TWL XL LVL3 (GOWN DISPOSABLE) ×1 IMPLANT
GOWN STRL REUS W/TWL LRG LVL3 (GOWN DISPOSABLE)
GOWN STRL REUS W/TWL XL LVL3 (GOWN DISPOSABLE)
HOLSTER ELECTROSUGICAL PENCIL (MISCELLANEOUS) ×1 IMPLANT
KIT TURNOVER KIT A (KITS) ×1 IMPLANT
MANIFOLD NEPTUNE II (INSTRUMENTS) ×1 IMPLANT
PACK HIP PROSTHESIS (MISCELLANEOUS) ×1 IMPLANT
SPONGE T-LAP 18X18 ~~LOC~~+RFID (SPONGE) ×4 IMPLANT
TRAP FLUID SMOKE EVACUATOR (MISCELLANEOUS) ×1 IMPLANT
WATER STERILE IRR 500ML POUR (IV SOLUTION) ×1 IMPLANT

## 2023-02-26 NOTE — Progress Notes (Signed)
   02/26/23 1500  Spiritual Encounters  Type of Visit Initial  Care provided to: Patient  Referral source Chaplain assessment  Reason for visit Routine spiritual support  OnCall Visit No  Spiritual Framework  Presenting Themes Meaning/purpose/sources of inspiration  Interventions  Spiritual Care Interventions Made Established relationship of care and support;Compassionate presence  Intervention Outcomes  Outcomes Connection to spiritual care;Awareness around self/spiritual resourses;Awareness of support  Spiritual Care Plan  Spiritual Care Issues Still Outstanding No further spiritual care needs at this time (see row info)   Chaplain provided compassionate care to patient. Chaplain spiritual support services remain available as the need arises.

## 2023-02-26 NOTE — Plan of Care (Signed)
  Problem: Nutrition: Goal: Adequate nutrition will be maintained Outcome: Progressing   Problem: Coping: Goal: Level of anxiety will decrease Outcome: Progressing   Problem: Pain Managment: Goal: General experience of comfort will improve Outcome: Progressing   Problem: Skin Integrity: Goal: Risk for impaired skin integrity will decrease Outcome: Progressing   

## 2023-02-26 NOTE — H&P (Signed)
[place]   PATIENT NAME: Marilyn Romero    MR#:  [id number]  DATE OF BIRTH:  1934-06-18  DATE OF ADMISSION:  02/26/2023  PRIMARY CARE PHYSICIAN: Marilyn Fabry, MD   Patient is coming from: Home  REQUESTING/REFERRING PHYSICIAN: Loleta Rose, MD  CHIEF COMPLAINT:   Chief Complaint  Patient presents with   Hip Pain    HISTORY OF PRESENT ILLNESS:  Marilyn Romero is a 89 y.o. Caucasian female with medical history significant for GERD, diverticulitis, lumbar radiculopathy, and essential hypertension, who presented to the emergency room at [facility] with right hip nontraumatic dislocation.  She denies any falls.  No presyncope or syncope.  No chest pain or palpitations.  No cough or wheezing or dyspnea.  No dysuria, oliguria or hematuria or flank pain.  No headache or dizziness or blurred vision.  No paresthesias or focal muscle weakness.  She could not have her shoulder reduced at [facility].  Contact was made with Dr. Joice Lofts who has performed a prior hip arthroplasty on her and he accepted her for transfer ED to ED.  No fever or chills.  No nausea or vomiting or abdominal pain.  No chest pain or palpitations.  No cough or wheezing or dyspnea.  ED Course: When she came to the ER here, vital signs were within normal.  Labs revealed hypokalemia of 3 with otherwise unremarkable BMP.  CBC was within normal. EKG as reviewed by me : None Imaging: Portable pelvic x-ray showed persistent superior dislocation of the femoral component of the right hip.  The patient was given 4 mg of IV morphine sulfate.  She will be admitted to medical bed for further evaluation and management.                                                                                                                                                                                                                                                          PAST MEDICAL HISTORY:   Past Medical History:  Diagnosis Date    Cancer of sigmoid colon (HCC) 12/08/2014   Colonic mass 10/07/2012   Overview:  Gets routine colonoscopies. Dx was 2013.   DDD (degenerative disc disease), lumbar 12/16/2014   Diverticulitis 06/27/2011   Facet syndrome, lumbar 12/16/2014   GERD (gastroesophageal  reflux disease) 05/26/2012   Insomnia 06/18/2016   Last Assessment & Plan:  Relevant Hx: Course: Daily Update: Today's Plan:   Lesion of bladder 04/29/2012   Lumbar radiculopathy 10/04/2015   Sacroiliac joint dysfunction of both sides 12/16/2014   Status post total replacement of right hip 08/15/2015    PAST SURGICAL HISTORY:   Past Surgical History:  Procedure Laterality Date   COLON RESECTION     COLONOSCOPY WITH PROPOFOL N/A 07/02/2016   Procedure: COLONOSCOPY WITH PROPOFOL;  Surgeon: Midge Minium, MD;  Location: [facility];  Service: Endoscopy;  Laterality: N/A;   ESOPHAGOGASTRODUODENOSCOPY (EGD) WITH PROPOFOL N/A 07/02/2016   Procedure: ESOPHAGOGASTRODUODENOSCOPY (EGD) WITH PROPOFOL;  Surgeon: Midge Minium, MD;  Location: [facility];  Service: Endoscopy;  Laterality: N/A;   HIP CLOSED REDUCTION Right 01/17/2023   Procedure: CLOSED REDUCTION HIP;  Surgeon: Christena Flake, MD;  Location: [facility] ORS;  Service: Orthopedics;  Laterality: Right;   HIP CLOSED REDUCTION Right 01/24/2023   Procedure: CLOSED REDUCTION HIP;  Surgeon: Christena Flake, MD;  Location: [facility] ORS;  Service: Orthopedics;  Laterality: Right;   HIP CLOSED REDUCTION Right 02/02/2023   Procedure: CLOSED REDUCTION HIP;  Surgeon: Oliver Barre, MD;  Location: [facility];  Service: Orthopedics;  Laterality: Right;   HIP SURGERY Right    LUNG SURGERY     non-malignant mass on lung    SOCIAL HISTORY:   Social History   Tobacco Use   Smoking status: Never   Smokeless tobacco: Never  Substance Use Topics   Alcohol use: No    Alcohol/week: 0.0 standard drinks of alcohol    FAMILY HISTORY:   Family History  Problem Relation Age of Onset   Early death Mother     Arthritis Father    Hypertension Father    Breast cancer Other     DRUG ALLERGIES:  No Known Allergies  REVIEW OF SYSTEMS:   ROS As per history of present illness. All pertinent systems were reviewed above. Constitutional, HEENT, cardiovascular, respiratory, GI, GU, musculoskeletal, neuro, psychiatric, endocrine, integumentary and hematologic systems were reviewed and are otherwise negative/unremarkable except for positive findings mentioned above in the HPI.   MEDICATIONS AT HOME:   Prior to Admission medications   Medication Sig Start Date End Date Taking? Authorizing Provider  acetaminophen (TYLENOL) 325 MG tablet Take 650 mg by mouth every 4 (four) hours as needed for headache, mild pain or fever.    Keane Police, MD  diclofenac Sodium (VOLTAREN) 1 % GEL Apply 2 g topically 4 (four) times daily. Apply to right hip.    [provider]  ferrous sulfate 325 (65 FE) MG tablet Take 325 mg by mouth daily with breakfast.    Bryson Corona, NP  guaiFENesin (ROBITUSSIN) 100 MG/5ML liquid Take 15 mLs by mouth every 6 (six) hours as needed for cough.    Keane Police, MD  HYDROcodone-acetaminophen (NORCO/VICODIN) 5-325 MG tablet Take 1 tablet by mouth every 6 (six) hours as needed. 02/04/23   [provider]  lidocaine (LIDOCAINE PAIN RELIEF) 4 % Place 1 patch onto the skin daily. Apply to right hip for 12 hours in a 24 hour period. Patient not taking: Reported on 02/15/2023 09/13/22   [provider]  lisinopril (ZESTRIL) 10 MG tablet Take 10 mg by mouth daily.    [provider]  magnesium hydroxide (MILK OF MAGNESIA) 400 MG/5ML suspension Take 30 mLs by mouth daily as needed for mild constipation.    [provider]  mirtazapine (REMERON) 7.5 MG tablet Take 7.5 mg by mouth at bedtime. 02/12/23   [provider]  Multiple Vitamins-Minerals (CEROVITE SENIOR) TABS Take 1 tablet by mouth daily.    Keane Police, MD      VITAL SIGNS:  Blood pressure 128/64, pulse 69, temperature 97.7 F (36.5 C), temperature source Oral, resp. rate 18, SpO2 98%.  PHYSICAL EXAMINATION:  Physical Exam  GENERAL:  89 y.o.-year-old female patient lying in the bed with no acute distress.  EYES: Pupils equal, round, reactive to light and accommodation. No scleral icterus. Extraocular muscles intact.  HEENT: Head atraumatic, normocephalic. Oropharynx and nasopharynx clear.  NECK:  Supple, no jugular venous distention. No thyroid enlargement, no tenderness.  LUNGS: Normal breath sounds bilaterally, no wheezing, rales,rhonchi or crepitation. No use of accessory muscles of respiration.  CARDIOVASCULAR: Regular rate and rhythm, S1, S2 normal. No murmurs, rubs, or gallops.  ABDOMEN: Soft, nondistended, nontender. Bowel sounds present. No organomegaly or mass.  EXTREMITIES: No pedal edema, cyanosis, or clubbing.  NEUROLOGIC: Cranial nerves II through XII are intact. Muscle strength 5/5 in all extremities. Sensation intact. Gait not checked.  PSYCHIATRIC: The patient is alert and oriented x 3.  Normal affect and good eye contact. SKIN: No obvious rash, lesion, or ulcer.  Musculoskeletal: Mild external rotation of the right hip with shortening of the right leg.  She has pain with attempted movement and deformity in the right hip. LABORATORY PANEL:   CBC Recent Labs  Lab 02/25/23 1857  WBC 7.7  HGB 12.6  HCT 39.4  PLT 343   ------------------------------------------------------------------------------------------------------------------  Chemistries  Recent Labs  Lab 02/25/23 1857  NA 135  K 3.0*  CL 104  CO2 25  GLUCOSE 113*  BUN 16  CREATININE 0.60  CALCIUM 9.0   ------------------------------------------------------------------------------------------------------------------  Cardiac Enzymes No results for input(s): "TROPONINI" in the last 168  hours. ------------------------------------------------------------------------------------------------------------------  RADIOLOGY:  DG Pelvis Portable  Result Date: 02/25/2023 CLINICAL DATA:  Status post reduction of right hip. EXAM: PORTABLE PELVIS 1-2 VIEWS COMPARISON:  Radiograph earlier today FINDINGS: The arthroplasty dislocation is not reduced. Persistent superior dislocation of the femoral component of right hip arthroplasty. Stable positioning of the acetabular cup. No fracture is seen. Distal aspect of the femoral stem is not included in this field of view. IMPRESSION: Persistent superior dislocation of the femoral component of right hip arthroplasty. Electronically Signed   By: Narda Rutherford M.D.   On: 02/25/2023 20:55   DG Hip Unilat W or Wo Pelvis 2-3 Views Right  Result Date: 02/25/2023 CLINICAL DATA:  Right hip dislocation, pain EXAM: DG HIP (WITH OR WITHOUT PELVIS) 2-3V RIGHT COMPARISON:  02/10/2023 FINDINGS: Frontal view of the pelvis as well as frontal and cross-table lateral views of the right hip are obtained. There is superior dislocation of the femoral component of the right hip arthroplasty relative to the acetabular component. No evidence of fracture. Bones are diffusely osteopenic. IMPRESSION: 1. Superior dislocation of the right hip arthroplasty. No acute fracture. Electronically Signed   By: Sharlet Salina M.D.   On: 02/25/2023 19:37      IMPRESSION AND PLAN:  Assessment and Plan: Recurrent dislocation of right hip - The patient is status post a right hip replacement with chronic pain and recurrent dislocations. - Attempted relocation of the right hip in the ED at [facility] did not succeed. - She is admitted to a medical bed. - Pain management will be provided. - Orthopedic consultation will be obtained. - Dr.  Poggi is aware and accepted the patient for transfer here.  Essential hypertension - We will continue his antihypertensive therapy.  DDD (degenerative disc  disease), lumbar Pain management will be provided.   DVT prophylaxis: SCDs.  Medical prophylaxis is currently postponed till postoperative. Advanced Care Planning:  Code Status the patient is DNR and DNI.  She agrees to be intubated during surgery and perioperatively. Family Communication:  The plan of care was discussed in details with the patient (and family). I answered all questions. The patient agreed to proceed with the above mentioned plan. Further management will depend upon hospital course. Disposition Plan: Back to previous home environment Consults called: Orthopedic consult All the records are reviewed and case discussed with ED provider.  Status is: Inpatient   At the time of the admission, it appears that the appropriate admission status for this patient is inpatient.  This is judged to be reasonable and necessary in order to provide the required intensity of service to ensure the patient's safety given the presenting symptoms, physical exam findings and initial radiographic and laboratory data in the context of comorbid conditions.  The patient requires inpatient status due to high intensity of service, high risk of further deterioration and high frequency of surveillance required.  I certify that at the time of admission, it is my clinical judgment that the patient will require inpatient hospital care extending more than 2 midnights.                            Dispo: The patient is from: Home              Anticipated d/c is to: Home              Patient currently is not medically stable to d/c.              Difficult to place patient: No  Hannah Beat M.D on 02/26/2023 at 5:04 AM  [facility]   From 7 PM-7 AM, contact night-coverage [email or web address]  CC: Primary care physician; Marilyn Fabry, MD

## 2023-02-26 NOTE — Progress Notes (Signed)
I reviewed the case and imaging with the patient and family.  Tentative plan to do a revision hip procedure on Thursday, 02/28/2023 during this admission to convert the right hip to a constrained liner and reduce the risk of future dislocations.  full preoperative evaluation dictation to follow.

## 2023-02-26 NOTE — TOC Initial Note (Signed)
Transition of Care [facility]) - Initial/Assessment Note    Patient Details  Name: Marilyn Romero MRN: [id number] Date of Birth: Nov 06, 1933  Transition of Care [facility]) CM/SW Contact:    Kreg Shropshire, RN Phone Number: 02/26/2023, 8:41 AM  Clinical Narrative:                  Pt arrived from ED from: [facility], transferred from [facility] Caregiver Support: Ancil Boozer Daughter DME at Home: Wheelchair Transportation: ambulance/facility provides transportation. Previous Services: Was STR at [place], then transitioned to Long term resident HH/SNF Preference: [name] First Person of Contact: [name]  PCP: Dr. Leim Fabry  Readmission prevention screening competed Cm will continue to follow for toc needs and d/c planning.  Expected Discharge Plan: Skilled Nursing Facility Barriers to Discharge: Continued Medical Work up   Patient Goals and CMS Choice     Choice offered to / list presented to : Adult Children      Expected Discharge Plan and Services                                              Prior Living Arrangements/Services   Lives with:: Facility Resident          Need for Family Participation in Patient Care: Yes (Comment) Care giver support system in place?: Yes (comment)      Activities of Daily Living Home Assistive Devices/Equipment: Brace (specify type), Eyeglasses ADL Screening (condition at time of admission) Patient's cognitive ability adequate to safely complete daily activities?: Yes Is the patient deaf or have difficulty hearing?: No Does the patient have difficulty seeing, even when wearing glasses/contacts?: No Does the patient have difficulty concentrating, remembering, or making decisions?: No Patient able to express need for assistance with ADLs?: Yes Does the patient have difficulty dressing or bathing?: Yes Independently performs ADLs?: No Communication: Independent Dressing (OT): Needs assistance Is this a change  from baseline?: Change from baseline, expected to last <3days Grooming: Independent Feeding: Independent Bathing: Needs assistance Is this a change from baseline?: Change from baseline, expected to last <3 days Toileting: Needs assistance Is this a change from baseline?: Change from baseline, expected to last >3days In/Out Bed: Needs assistance Is this a change from baseline?: Change from baseline, expected to last >3 days Walks in Home: Needs assistance Is this a change from baseline?: Change from baseline, expected to last >3 days Does the patient have difficulty walking or climbing stairs?: Yes Weakness of Legs: Both Weakness of Arms/Hands: None  Permission Sought/Granted            Permission granted to share info w Relationship: Ancil Boozer     Emotional Assessment Appearance:: Appears younger than stated age            Admission diagnosis:  Recurrent anterior dislocation of right shoulder [M24.411] Recurrent dislocation of right hip [M24.451] Patient Active Problem List   Diagnosis Date Noted   Recurrent dislocation of right hip 02/26/2023   Dislocation of hip joint prosthesis, subsequent encounter 02/02/2023   Hypokalemia 01/25/2023   Acute anemia 01/25/2023   Hip dislocation, right (HCC) 01/23/2023   Right sided weakness 08/29/2022   Mixed hyperlipidemia 08/29/2022   Baker's cyst of knee, left 12/10/2021   SBO (small bowel obstruction) (HCC) 12/08/2021   UTI (urinary tract infection) 12/08/2021   Essential hypertension 12/08/2021   Dysphagia 12/08/2021  Chronic pain syndrome 01/10/2021   History of hysterectomy 06/23/2019   Leukocytosis 06/23/2019   Low vitamin B12 level 02/12/2018   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) 01/08/2018   Sacroiliac joint pain 01/08/2018   Right rotator cuff tear arthropathy 05/07/2017   Impingement syndrome of right shoulder 09/14/2016   Personal history of colon cancer    Abdominal pain,  epigastric    Acute peptic ulcer of stomach    Gastritis without bleeding    Insomnia 06/18/2016   Musculoskeletal pain 10/04/2015   Lumbar radiculopathy 10/04/2015   Status post total replacement of right hip 08/15/2015   Primary osteoarthritis of one hip 08/15/2015   Pain due to total hip replacement (HCC) 08/15/2015   DDD (degenerative disc disease), lumbar 12/16/2014   Facet syndrome, lumbar 12/16/2014   Sacroiliac joint dysfunction of both sides 12/16/2014   Cancer of sigmoid colon (HCC) 12/08/2014   Pain medication agreement signed 08/12/2013   Pure hypercholesterolemia 08/10/2013   Colonic mass 10/07/2012   GERD (gastroesophageal reflux disease) 05/26/2012   Lesion of bladder 04/29/2012   Atrophic vaginitis 04/29/2012   Dysuria 04/17/2012   Microscopic hematuria 04/17/2012   Diverticulitis 06/27/2011   PCP:  Leim Fabry, MD Pharmacy:   [facility] [place] - [address] [place] [address] Phone: [phone] Fax: [phone]     Social Determinants of Health (SDOH) Social History: SDOH Screenings   Food Insecurity: Patient Declined (02/26/2023)  Housing: Patient Declined (02/26/2023)  Recent Concern: Housing - High Risk (01/24/2023)  Transportation Needs: Patient Declined (02/26/2023)  Utilities: Patient Declined (02/26/2023)  Depression (PHQ2-9): Low Risk  (06/27/2022)  Tobacco Use: Low Risk  (02/26/2023)   SDOH Interventions:     Readmission Risk Interventions    02/26/2023    8:39 AM  Readmission Risk Prevention Plan  Transportation Screening Complete  PCP or Specialist Appt within 3-5 Days Complete  HRI or Home Care Consult Complete  Social Work Consult for Recovery Care Planning/Counseling Complete  Palliative Care Screening Not Applicable  Medication Review Oceanographer) Referral to Pharmacy

## 2023-02-26 NOTE — Hospital Course (Addendum)
89 y.o. Caucasian female with medical history significant for GERD, diverticulitis, lumbar radiculopathy, and essential hypertension, who presented to the emergency room l with right hip nontraumatic dislocation.  This is the fifth time patient had her prosthetic right hip joint dislocation. Closed reduction in the OR is performed on 7/30.  Revision of the right hip done 8/1.  8/2.  Patient is delirious this morning after surgery.  Did not sleep very well last night.  Patient not complaining of pain. 8/3.  Patient slept better last night with Seroquel.  Having more pain in her right hip today.  Did not walk as far with physical therapy today.  Seen while sitting in the chair. 8/4.  Patient asking a lot of questions today and was confused what happened.  I went over hospital course with her and asked her to remember things from now moving forward. 8/5.  Patient had a good night sleep with Seroquel.  Was seen sitting up eating breakfast.  Still having some pain in the right leg.  Had a large bowel movement today.  Patient having weakness on the right arm been going on for a while.  CT head negative for stroke.

## 2023-02-26 NOTE — Assessment & Plan Note (Addendum)
Patient and close posterior right prosthetic hip reduction on 7/30.  Hip revision surgery on 8/1.  Pain control.  Patient had a large bowel movement today.  Continue MiraLAX.  Lovenox injections daily for 14 days then stop.  Follow-up with orthopedics in 2 weeks.

## 2023-02-26 NOTE — Assessment & Plan Note (Addendum)
With blood pressure being on the lower side will hold lisinopril.

## 2023-02-26 NOTE — Transfer of Care (Signed)
Immediate Anesthesia Transfer of Care Note  Patient: Marilyn Romero  Procedure(s) Performed: CLOSED REDUCTION HIP (Right: Hip)  Patient Location: PACU  Anesthesia Type:MAC  Level of Consciousness: awake  Airway & Oxygen Therapy: Patient Spontanous Breathing  Post-op Assessment: Report given to RN and Post -op Vital signs reviewed and stable  Post vital signs: Reviewed and stable  Last Vitals:  Vitals Value Taken Time  BP 131/68 02/26/23 1131  Temp    Pulse 69 02/26/23 1133  Resp 23 02/26/23 1133  SpO2 98 % 02/26/23 1133  Vitals shown include unfiled device data.  Last Pain:  Vitals:   02/26/23 1026  TempSrc: Oral  PainSc: 0-No pain         Complications: No notable events documented.

## 2023-02-26 NOTE — ED Triage Notes (Signed)
Pt transfer from [facility], per carelink pt has dislocated right hip, pt has hx of same and [place] was unable to reduce hip. Pt c/o r hip pain. Pt last had fentanyl at 2158.

## 2023-02-26 NOTE — Progress Notes (Signed)
  Progress Note   Patient: Marilyn Romero VQQ:[id number] DOB: Aug 03, 1933 DOA: 02/26/2023     0 DOS: the patient was seen and examined on 02/26/2023   Brief hospital course: Marilyn Romero is a 89 y.o. Caucasian female with medical history significant for GERD, diverticulitis, lumbar radiculopathy, and essential hypertension, who presented to the emergency room l with right hip nontraumatic dislocation.  This is the fifth time patient had her prosthetic right hip joint dislocation. Closed reduction in the OR is performed on 7/30.  Revision of the right hip is scheduled on Thursday.   Principal Problem:   Recurrent dislocation of right hip Active Problems:   Essential hypertension   DDD (degenerative disc disease), lumbar   Assessment and Plan: * Recurrent dislocation of right hip Status post closed hip reduction today.  Planning for right hip revision on Thursday.  Will continue to keep patient in the hospital until the procedure is done.  Hypokalemia. Repleted and normalized.  Essential hypertension - We will continue his antihypertensive therapy.  DDD (degenerative disc disease), lumbar Pain management will be provided.       Subjective:  The patient doing well today, no significant pain.  Physical Exam: Vitals:   02/26/23 1245 02/26/23 1300 02/26/23 1310 02/26/23 1330  BP: (!) 143/65 124/64 134/65 138/67  Pulse: 65 66 67 66  Resp: (!) 27 (!) 21 (!) 27 16  Temp:  98.2 F (36.8 C)  98 F (36.7 C)  TempSrc:      SpO2: 96% 97% 97% 99%  Weight:      Height:       General exam: Appears calm and comfortable  Respiratory system: Clear to auscultation. Respiratory effort normal. Cardiovascular system: S1 & S2 heard, RRR. No JVD, murmurs, rubs, gallops or clicks. No pedal edema. Gastrointestinal system: Abdomen is nondistended, soft and nontender. No organomegaly or masses felt. Normal bowel sounds heard. Central nervous system: Alert and oriented x2. No focal neurological  deficits. Extremities: Symmetric 5 x 5 power. Skin: No rashes, lesions or ulcers Psychiatry:  Mood & affect appropriate.    Data Reviewed:  X-ray and lab results reviewed.  Family Communication: Daughter updated at bedside.  Disposition: Status is: Inpatient Remains inpatient appropriate because: Severity of disease, inpatient procedure.     Time spent: 35 minutes  Author: Marrion Coy, MD 02/26/2023 2:11 PM  For on call review [email or web address].

## 2023-02-26 NOTE — ED Notes (Signed)
Or here to take patient to the OR. Paperwork given to tx port personel. Alert and oriented.

## 2023-02-26 NOTE — ED Triage Notes (Addendum)
Pt arrived as ED to ED transfer from [facility] for direct admit due to right hip disc location. Pt with frequent dislocations of same hip. [facility] providers unable to reduce. [facility] Orth provider Poggi, consulted for closed reduction procedure scheduled for today.

## 2023-02-26 NOTE — Consult Note (Signed)
ORTHOPAEDIC CONSULTATION  REQUESTING PHYSICIAN: Marrion Coy, MD  Chief Complaint:   Right hip pain.  History of Present Illness: Marilyn Romero is a 89 y.o. female with multiple medical problems including dementia, hypertension, gastroesophageal reflux disease, and history of sigmoid colon cancer who is now 7+ years status post a right total hip arthroplasty.  The patient has had recurrent episodes of posterior hip dislocation events which have been increasing in frequency to the point where the hip has been dislocating every 10 to 14 days.  They have been reduced under sedation in the operating room each time.  The patient's hip popped out again last evening.  She presented to [facility] where x-rays confirmed the dislocation.  The ER provider was unable to get it reduced in the emergency room.  The orthopedic surgeon did not feel he could do it because of the shortage of CRNA's, so the patient was transferred here for definitive management of this issue.  Past Medical History:  Diagnosis Date   Cancer of sigmoid colon (HCC) 12/08/2014   Colonic mass 10/07/2012   Overview:  Gets routine colonoscopies. Dx was 2013.   DDD (degenerative disc disease), lumbar 12/16/2014   Diverticulitis 06/27/2011   Facet syndrome, lumbar 12/16/2014   GERD (gastroesophageal reflux disease) 05/26/2012   Insomnia 06/18/2016   Last Assessment & Plan:  Relevant Hx: Course: Daily Update: Today's Plan:   Lesion of bladder 04/29/2012   Lumbar radiculopathy 10/04/2015   Sacroiliac joint dysfunction of both sides 12/16/2014   Status post total replacement of right hip 08/15/2015   Past Surgical History:  Procedure Laterality Date   COLON RESECTION     COLONOSCOPY WITH PROPOFOL N/A 07/02/2016   Procedure: COLONOSCOPY WITH PROPOFOL;  Surgeon: Midge Minium, MD;  Location: [facility];  Service: Endoscopy;  Laterality: N/A;   ESOPHAGOGASTRODUODENOSCOPY (EGD)  WITH PROPOFOL N/A 07/02/2016   Procedure: ESOPHAGOGASTRODUODENOSCOPY (EGD) WITH PROPOFOL;  Surgeon: Midge Minium, MD;  Location: [facility];  Service: Endoscopy;  Laterality: N/A;   HIP CLOSED REDUCTION Right 01/17/2023   Procedure: CLOSED REDUCTION HIP;  Surgeon: Christena Flake, MD;  Location: [facility];  Service: Orthopedics;  Laterality: Right;   HIP CLOSED REDUCTION Right 01/24/2023   Procedure: CLOSED REDUCTION HIP;  Surgeon: Christena Flake, MD;  Location: [facility];  Service: Orthopedics;  Laterality: Right;   HIP CLOSED REDUCTION Right 02/02/2023   Procedure: CLOSED REDUCTION HIP;  Surgeon: Oliver Barre, MD;  Location: [facility];  Service: Orthopedics;  Laterality: Right;   HIP SURGERY Right    LUNG SURGERY     non-malignant mass on lung   Social History   Socioeconomic History   Marital status: Divorced    Spouse name: Not on file   Number of children: Not on file   Years of education: Not on file   Highest education level: Not on file  Occupational History   Not on file  Tobacco Use   Smoking status: Never   Smokeless tobacco: Never  Substance and Sexual Activity   Alcohol use: No    Alcohol/week: 0.0 standard drinks of alcohol   Drug use: No   Sexual activity: Never  Other Topics Concern   Not on file  Social History Narrative   Not on file   Social Determinants of Health   Financial Resource Strain: Not on file  Food Insecurity: Patient Declined (02/26/2023)   Hunger Vital Sign    Worried About Running Out of Food in the Last Year:  Patient declined    Barista in the Last Year: Patient declined  Transportation Needs: Patient Declined (02/26/2023)   PRAPARE - Administrator, Civil Service (Medical): Patient declined    Lack of Transportation (Non-Medical): Patient declined  Physical Activity: Not on file  Stress: Not on file  Social Connections: Not on file   Family History  Problem Relation Age of Onset   Early death Mother    Arthritis  Father    Hypertension Father    Breast cancer Other    No Known Allergies Prior to Admission medications   Medication Sig Start Date End Date Taking? Authorizing Provider  acetaminophen (TYLENOL) 325 MG tablet Take 650 mg by mouth every 4 (four) hours as needed for headache, mild pain or fever.   Yes Slade-Hartman, Alvino Chapel, MD  ferrous sulfate 325 (65 FE) MG tablet Take 325 mg by mouth daily with breakfast.   Yes Bryson Corona, NP  HYDROcodone-acetaminophen (NORCO/VICODIN) 5-325 MG tablet Take 1 tablet by mouth every 6 (six) hours as needed. 02/04/23  Yes [provider]  magnesium hydroxide (MILK OF MAGNESIA) 400 MG/5ML suspension Take 30 mLs by mouth daily as needed for mild constipation.   Yes [provider]  mirtazapine (REMERON) 7.5 MG tablet Take 7.5 mg by mouth at bedtime. 02/12/23  Yes [provider]  diclofenac Sodium (VOLTAREN) 1 % GEL Apply 2 g topically 4 (four) times daily. Apply to right hip. Patient not taking: Reported on 02/26/2023    [provider]  guaiFENesin (ROBITUSSIN) 100 MG/5ML liquid Take 15 mLs by mouth every 6 (six) hours as needed for cough. Patient not taking: Reported on 02/26/2023    Keane Police, MD  lidocaine (LIDOCAINE PAIN RELIEF) 4 % Place 1 patch onto the skin daily. Apply to right hip for 12 hours in a 24 hour period. Patient not taking: Reported on 02/26/2023 09/13/22   [provider]  lisinopril (ZESTRIL) 10 MG tablet Take 10 mg by mouth daily. Patient not taking: Reported on 02/26/2023    [provider]  Multiple Vitamins-Minerals (CEROVITE SENIOR) TABS Take 1 tablet by mouth daily. Patient not taking: Reported on 02/26/2023    Keane Police, MD   DG Pelvis Portable  Result Date: 02/25/2023 CLINICAL DATA:  Status post reduction of right hip. EXAM: PORTABLE PELVIS 1-2 VIEWS COMPARISON:  Radiograph earlier today FINDINGS: The arthroplasty dislocation is not reduced.  Persistent superior dislocation of the femoral component of right hip arthroplasty. Stable positioning of the acetabular cup. No fracture is seen. Distal aspect of the femoral stem is not included in this field of view. IMPRESSION: Persistent superior dislocation of the femoral component of right hip arthroplasty. Electronically Signed   By: Narda Rutherford M.D.   On: 02/25/2023 20:55   DG Hip Unilat W or Wo Pelvis 2-3 Views Right  Result Date: 02/25/2023 CLINICAL DATA:  Right hip dislocation, pain EXAM: DG HIP (WITH OR WITHOUT PELVIS) 2-3V RIGHT COMPARISON:  02/10/2023 FINDINGS: Frontal view of the pelvis as well as frontal and cross-table lateral views of the right hip are obtained. There is superior dislocation of the femoral component of the right hip arthroplasty relative to the acetabular component. No evidence of fracture. Bones are diffusely osteopenic. IMPRESSION: 1. Superior dislocation of the right hip arthroplasty. No acute fracture. Electronically Signed   By: Sharlet Salina M.D.   On: 02/25/2023 19:37    Positive ROS: All other systems have been reviewed and were otherwise  negative with the exception of those mentioned in the HPI and as above.  Physical Exam: General:  Alert, no acute distress Psychiatric:  Patient is competent for consent with normal mood and affect   Cardiovascular:  No pedal edema Respiratory:  No wheezing, non-labored breathing GI:  Abdomen is soft and non-tender Skin:  No lesions in the area of chief complaint Neurologic:  Sensation intact distally Lymphatic:  No axillary or cervical lymphadenopathy  Orthopedic Exam:  Orthopedic examination is limited to the right hip and lower extremity. The right lower extremity is somewhat shortened and internally rotated as compared to the left. Skin inspection around the right hip is notable for a well-healed surgical incision, but otherwise is unremarkable. No swelling, erythema, ecchymosis, abrasions, or other skin  abnormalities are identified.   X-rays:  Recent x-rays of the pelvis and right hip are available for review. These films demonstrate a posterior superior dislocation of the right total hip arthroplasty. The femoral and acetabular components appear to be in satisfactory position and without evidence of loosening. No fractures or other acute bony abnormalities are identified.   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  Plan: The treatment options, including both surgical and nonsurgical choices, have been discussed in detail with the patient and her daughter (by phone).  They both would like to proceed with surgical intervention to include a closed reduction of her prosthetic right hip dislocation.  The risks (including bleeding, infection, nerve and/or blood vessel injury, persistent or recurrent pain, loosening or failure of the components, recurrent dislocation, need for further surgery, blood clots, strokes, heart attacks or arrhythmias, pneumonia, etc.) and benefits of the surgical procedure were discussed.  The patient and her daughter state their understanding and agree to proceed.  A formal written consent will be obtained by the nursing staff.    Thank you for asking me to participate in the care of this most pleasant yet unfortunate woman.  I will be happy to follow her with you.    Maryagnes Amos, MD  Beeper #:  [phone]  02/26/2023 10:22 AM

## 2023-02-26 NOTE — Progress Notes (Signed)
PT Cancellation Note  Patient Details Name: Marilyn Romero MRN: [id number] DOB: [birth year]   Cancelled Treatment:    Reason Eval/Treat Not Completed: Medical issues which prohibited therapy (Consult received and chart reviewed. Patient currently scheduled to undergo closed reduction of R hip in OR this PM.  Will hold until procedure complete and patient cleared for activity post-procedure.)   Cande Mastropietro H. Manson Passey, PT, DPT, NCS 02/26/23, 8:26 AM [phone]

## 2023-02-26 NOTE — Anesthesia Preprocedure Evaluation (Signed)
Anesthesia Evaluation  Patient identified by MRN, date of birth, ID band Patient awake    Reviewed: Allergy & Precautions, H&P , NPO status , Patient's Chart, lab work & pertinent test results, reviewed documented beta blocker date and time   Airway Mallampati: II   Neck ROM: full    Dental  (+) Poor Dentition   Pulmonary neg pulmonary ROS   Pulmonary exam normal        Cardiovascular Exercise Tolerance: Poor hypertension, On Medications Normal cardiovascular exam Rhythm:regular Rate:Normal     Neuro/Psych negative neurological ROS  negative psych ROS   GI/Hepatic Neg liver ROS, PUD,GERD  ,,  Endo/Other  negative endocrine ROS    Renal/GU negative Renal ROS  negative genitourinary   Musculoskeletal   Abdominal   Peds  Hematology  (+) Blood dyscrasia, anemia   Anesthesia Other Findings Past Medical History: 12/08/2014: Cancer of sigmoid colon (HCC) 10/07/2012: Colonic mass     Comment:  Overview:  Gets routine colonoscopies. Dx was 2013. 12/16/2014: DDD (degenerative disc disease), lumbar 06/27/2011: Diverticulitis 12/16/2014: Facet syndrome, lumbar 05/26/2012: GERD (gastroesophageal reflux disease) 06/18/2016: Insomnia     Comment:  Last Assessment & Plan:  Relevant Hx: Course: Daily               Update: Today's Plan: 04/29/2012: Lesion of bladder 10/04/2015: Lumbar radiculopathy 12/16/2014: Sacroiliac joint dysfunction of both sides 08/15/2015: Status post total replacement of right hip Past Surgical History: No date: COLON RESECTION 07/02/2016: COLONOSCOPY WITH PROPOFOL; N/A     Comment:  Procedure: COLONOSCOPY WITH PROPOFOL;  Surgeon: Midge Minium, MD;  Location: [facility];  Service:               Endoscopy;  Laterality: N/A; 07/02/2016: ESOPHAGOGASTRODUODENOSCOPY (EGD) WITH PROPOFOL; N/A     Comment:  Procedure: ESOPHAGOGASTRODUODENOSCOPY (EGD) WITH               PROPOFOL;  Surgeon:  Midge Minium, MD;  Location: [facility];  Service: Endoscopy;  Laterality: N/A; 01/17/2023: HIP CLOSED REDUCTION; Right     Comment:  Procedure: CLOSED REDUCTION HIP;  Surgeon: Christena Flake, MD;  Location: [facility];  Service: Orthopedics;                Laterality: Right; 01/24/2023: HIP CLOSED REDUCTION; Right     Comment:  Procedure: CLOSED REDUCTION HIP;  Surgeon: Christena Flake, MD;  Location: [facility];  Service: Orthopedics;                Laterality: Right; 02/02/2023: HIP CLOSED REDUCTION; Right     Comment:  Procedure: CLOSED REDUCTION HIP;  Surgeon: Oliver Barre, MD;  Location: [facility];  Service: Orthopedics;                Laterality: Right; No date: HIP SURGERY; Right No date: LUNG SURGERY     Comment:  non-malignant mass on lung BMI    Body Mass Index: 19.68 kg/m     Reproductive/Obstetrics negative OB ROS  Anesthesia Physical Anesthesia Plan  ASA: 3 and emergent  Anesthesia Plan: General   Post-op Pain Management:    Induction:   PONV Risk Score and Plan: 4 or greater  Airway Management Planned:   Additional Equipment:   Intra-op Plan:   Post-operative Plan:   Informed Consent: I have reviewed the patients History and Physical, chart, labs and discussed the procedure including the risks, benefits and alternatives for the proposed anesthesia with the patient or authorized representative who has indicated his/her understanding and acceptance.     Dental Advisory Given  Plan Discussed with: CRNA  Anesthesia Plan Comments:        Anesthesia Quick Evaluation

## 2023-02-26 NOTE — Op Note (Signed)
02/26/2023  11:21 AM  Patient:   Marilyn Romero  Pre-Op Diagnosis:   Closed posterior right prothesic hip dislocation.  Post-Op Diagnosis:   Same.  Procedure:   Closed reduction of right prosthetic hip dislocation.  Surgeon:   Maryagnes Amos, MD  Assistant:   None  Anesthesia:   IV sedation  Findings:   As above.  Complications:   None  EBL:   None  Fluids:   200 cc crystalloid  TT:   None  Drains:   None  Closure:   None  Implants:   None  Brief Clinical Note:   The patient is an 89 year old female with a history of recurrent posterior right prosthetic hip dislocations which have been occurring more frequently over the past several months.  The patient apparently dislocated her hip again last evening and presented to [facility].  The ER provider was unable to reduce the hip under IV sedation in the emergency room.  Therefore, the patient has been transferred here for definitive management as they do not have the OR availability due to the CRNA shortage.  The patient has been cleared medically and is being brought to the operating room at this time for closed reduction under general anesthesia of this dislocation.  Procedure:   The patient was brought into the operating room and lain in the supine position. After adequate IV sedation was obtained, a timeout was performed to verify the appropriate surgical site. The hip was then reduced using longitudinal traction with internal rotation and flexion while countertraction was applied to the pelvis. The hip was felt to clunk back into place. The leg lengths were now equal and leg rotation was symmetric to the contralateral side. The hip was stable to flexion to 90 with internal rotation to 10. The adequacy of reduction was confirmed by fluoroscopic imaging. The patient was then awakened and returned to the recovery room in satisfactory condition after tolerating the procedure well.

## 2023-02-26 NOTE — Assessment & Plan Note (Signed)
Continue working with physical therapy

## 2023-02-26 NOTE — ED Provider Notes (Signed)
[facility] Provider Note    Event Date/Time   First MD Initiated Contact with Patient 02/26/23 0130     (approximate)   History   Hip Pain   HPI Marilyn Romero is a 89 y.o. female who is a patient of Dr. Binnie Rail with prior hip arthroplasty who presents as a transfer from [facility] ED for persistent and nonreducible hip dislocation.  See the previous ED note for additional details, but in short, the patient stood up tonight and felt the hip out of place.  Reduction under propofol sedation was attempted in the emergency department but was unsuccessful.  The ED provider contacted Dr. Joice Lofts who recommended transfer to [facility] and he has her on the OR schedule for the morning.  Patient reported pain upon initial arrival and had not had any pain medication for several hours.  She received morphine 4 mg IV and still feels some discomfort but it is better.  She reports no chest pain or shortness of breath and no other injuries.     Physical Exam   Triage Vital Signs: ED Triage Vitals  Encounter Vitals Group     BP 02/26/23 0135 (!) 140/73     Systolic BP Percentile --      Diastolic BP Percentile --      Pulse Rate 02/26/23 0135 73     Resp 02/26/23 0135 18     Temp 02/26/23 0142 97.7 F (36.5 C)     Temp Source 02/26/23 0142 Oral     SpO2 02/26/23 0135 99 %     Weight --      Height --      Head Circumference --      Peak Flow --      Pain Score 02/26/23 0131 10     Pain Loc --      Pain Education --      Exclude from Growth Chart --     Most recent vital signs: Vitals:   02/26/23 0135 02/26/23 0142  BP: (!) 140/73   Pulse: 73   Resp: 18   Temp:  97.7 F (36.5 C)  SpO2: 99%     General: Sleeping but awakened easily.  Elderly and frail in appearance. CV:  Regular rate and rhythm.  Right leg is warm and appears well-perfused. Resp:  Normal effort. Speaking easily and comfortably, no accessory muscle usage nor intercostal retractions.   Abd:  No  distention.  Other:  Minimal external rotation or shortening of right leg.  Pain with attempted movement.  Palpable deformity in the right hip/pelvis.   ED Results / Procedures / Treatments   Labs (all labs ordered are listed, but only abnormal results are displayed) Labs Reviewed - No data to display    PROCEDURES:  Critical Care performed: No  Procedures    IMPRESSION / MDM / ASSESSMENT AND PLAN / ED COURSE  I reviewed the triage vital signs and the nursing notes.                              Differential diagnosis includes, but is not limited to, hip dislocation, hip fracture, hardware failure.  Patient's presentation is most consistent with acute presentation with potential threat to life or bodily function.  Labs/studies ordered: None at [facility]  Interventions/Medications given:  Medications  morphine (PF) 4 MG/ML injection 4 mg (4 mg Intravenous Given 02/26/23 0202)    (Note:  hospital course my include additional interventions and/or labs/studies not listed above.)   At this point given the history and already 1 failed attempt at reduction, I feel that additional attempts at sedation and reduction are more likely to be harmful than helpful.  Dr. Joice Lofts is already aware of the patient and has posted her to the OR schedule.  I am consulting the hospitalist service, specifically Dr. Arville Care, to admit the patient and await orthopedics intervention.         FINAL CLINICAL IMPRESSION(S) / ED DIAGNOSES   Final diagnoses:  Closed anterior dislocation of right hip, initial encounter [facility])     Rx / DC Orders   ED Discharge Orders     None        Note:  This document was prepared using Dragon voice recognition software and may include unintentional dictation errors.   Loleta Rose, MD 02/26/23 [phone]

## 2023-02-26 NOTE — ED Notes (Signed)
Awakens to verbal stimuli. Denies any pain. PPP. Skin warm and dry. IV intact LAC. Purwick in place canister empty at this time. OR called and report given to [facility] RN who shared that the OR would give her the mouth rinse and not to give her 10am  BP med.

## 2023-02-27 ENCOUNTER — Encounter: Payer: Self-pay | Admitting: Surgery

## 2023-02-27 DIAGNOSIS — M5136 Other intervertebral disc degeneration, lumbar region: Secondary | ICD-10-CM | POA: Diagnosis not present

## 2023-02-27 DIAGNOSIS — I10 Essential (primary) hypertension: Secondary | ICD-10-CM | POA: Diagnosis not present

## 2023-02-27 DIAGNOSIS — M24451 Recurrent dislocation, right hip: Secondary | ICD-10-CM | POA: Diagnosis not present

## 2023-02-27 NOTE — Evaluation (Signed)
Physical Therapy Evaluation Patient Details Name: Marilyn Romero MRN: [id number] DOB: 14-Sep-1933 Today's Date: 02/27/2023  History of Present Illness  Pt is an 89 y/o F who presents with R hip dislocation (multiple dislocations/reductions since original surgery). PMH of HTN, OA, HLD, falls, GERD, colon CA. Plan for R THA 8/1.  Clinical Impression  Pt received in bed, she is agreeable to PT session. Pt required minA x1 for bed mobility and transfers with RW. Pt was able to stand pivot transfer onto chair with cueing for hand placement on armchair when sitting down. Pt was able to follow commands but inconsistent with hx details. Plans for THA on 8/1. Pt will cont to benefit from skilled PT to focus on functional mobility and transfers.          If plan is discharge home, recommend the following: A little help with walking and/or transfers;A little help with bathing/dressing/bathroom;Help with stairs or ramp for entrance;Assist for transportation   Can travel by private vehicle   No    Equipment Recommendations Rolling walker (2 wheels)  Recommendations for Other Services       Functional Status Assessment Patient has had a recent decline in their functional status and demonstrates the ability to make significant improvements in function in a reasonable and predictable amount of time.     Precautions / Restrictions Precautions Precautions: None Precaution Comments: per MD, no restrictions at this time. S/p closed reduction 7/30. S/p multi R hip dislocations-limit hip flexion Restrictions Weight Bearing Restrictions: No      Mobility  Bed Mobility Overal bed mobility: Needs Assistance Bed Mobility: Supine to Sit     Supine to sit: Min guard     General bed mobility comments: with extra time    Transfers Overall transfer level: Needs assistance Equipment used: Rolling walker (2 wheels) Transfers: Sit to/from Stand Sit to Stand: Min guard, Min assist           General  transfer comment: Pt needing cues for hand placement    Ambulation/Gait Ambulation/Gait assistance: Modified independent (Device/Increase time) Gait Distance (Feet): 2 Feet Assistive device: Rolling walker (2 wheels) Gait Pattern/deviations: Decreased step length - right, Decreased stride length, Step-to pattern, Decreased stance time - left, Decreased stance time - right, Decreased step length - left, Shuffle       General Gait Details: NT due to fatigue  Stairs            Wheelchair Mobility     Tilt Bed    Modified Rankin (Stroke Patients Only)       Balance Overall balance assessment: Mild deficits observed, not formally tested                                           Pertinent Vitals/Pain Pain Assessment Pain Assessment: Faces Faces Pain Scale: Hurts little more Pain Location: R hip Pain Descriptors / Indicators: Aching Pain Intervention(s): Monitored during session, Repositioned    Home Living Family/patient expects to be discharged to:: Skilled nursing facility                   Additional Comments: Pt reports that she lives in an apartment, uses RW "recently". However, case management note (from discussion with daughter) states pt was at [facility] prior to admission.    Prior Function Prior Level of Function : Patient poor historian/Family not available  Mobility Comments: Pt reports using RW "recently". Need to clarify with family. ADLs Comments: Pt reports (I) with ADL/IADL/driving. Need to clarify with family. Pt is not oriented to date, so questionable historian.     Hand Dominance   Dominant Hand:  (pt's R hand is minimally functional, so uses L hand mostly.)    Extremity/Trunk Assessment   Upper Extremity Assessment Upper Extremity Assessment: RUE deficits/detail RUE Deficits / Details: Pt with hx of RUE non-surgical fracture per RN (pt unable to accurately describe) and therefore R hand is minimally  functional; Pt able to make weak grip; unable to flex R shoulder; has to place R hand on RW with L hand.    Lower Extremity Assessment Lower Extremity Assessment: Generalized weakness       Communication   Communication: HOH  Cognition Arousal/Alertness: Awake/alert Behavior During Therapy: WFL for tasks assessed/performed Overall Cognitive Status: No family/caregiver present to determine baseline cognitive functioning                                 General Comments: Pt is alert; orinted to self, the fact that her hip has dislocated, and "[facility]"; knows that it is Wednesday; does not know month, date, or year.        General Comments General comments (skin integrity, edema, etc.): Pt requires minA for standing balance    Exercises     Assessment/Plan    PT Assessment Patient needs continued PT services  PT Problem List Decreased strength;Decreased activity tolerance;Decreased mobility;Decreased cognition;Decreased balance;Decreased safety awareness;Decreased knowledge of precautions;Pain       PT Treatment Interventions Stair training    PT Goals (Current goals can be found in the Care Plan section)  Acute Rehab PT Goals Patient Stated Goal: get better PT Goal Formulation: With patient Time For Goal Achievement: 03/13/23 Potential to Achieve Goals: Good    Frequency Min 1X/week     Co-evaluation   Reason for Co-Treatment: Complexity of the patient's impairments (multi-system involvement);For patient/therapist safety;To address functional/ADL transfers   OT goals addressed during session: ADL's and self-care;Proper use of Adaptive equipment and DME       AM-PAC PT "6 Clicks" Mobility  Outcome Measure Help needed turning from your back to your side while in a flat bed without using bedrails?: A Little Help needed moving from lying on your back to sitting on the side of a flat bed without using bedrails?: A Little Help needed moving to  and from a bed to a chair (including a wheelchair)?: A Little Help needed standing up from a chair using your arms (e.g., wheelchair or bedside chair)?: A Little Help needed to walk in hospital room?: A Little Help needed climbing 3-5 steps with a railing? : A Little 6 Click Score: 18    End of Session   Activity Tolerance: Patient tolerated treatment well;Patient limited by fatigue;Patient limited by pain Patient left: in chair;with call bell/phone within reach;with bed alarm set;with chair alarm set;with SCD's reapplied Nurse Communication: Mobility status PT Visit Diagnosis: Unsteadiness on feet (R26.81);Difficulty in walking, not elsewhere classified (R26.2);Pain;Muscle weakness (generalized) (M62.81) Pain - Right/Left: Right Pain - part of body: Hip    Time: 1610-9604 PT Time Calculation (min) (ACUTE ONLY): 36 min   Charges:   PT Evaluation $PT Eval Low Complexity: 1 Low   PT General Charges $$ ACUTE PT VISIT: 1 Visit         Lissa Merlin,  PT, GCS 02/27/23,2:57 PM

## 2023-02-27 NOTE — Plan of Care (Signed)

## 2023-02-27 NOTE — Progress Notes (Signed)
Subjective: Marilyn Romero is an 89 year old female with a history of dementia, hypertension, gastroesophageal reflux disease, and history of sigmoid colon cancer who is now 16 years status post a right total hip arthroplasty.  The patient has had frequent recurrent posterior hip dislocations which have been increasing in frequency over the last few months.  She has had multiple reductions under sedation in the ER but it continues to dislocate with minimal physical effort sometimes just as simple as turning to face a different direction while sitting on a chair.  She is been treated with conservative measures including abduction braces and knee immobilizers but continues to dislocate which is compounded by incomplete brace use while in skilled nursing facilities and nursing homes.  She was reduced again yesterday and is now feeling okay with regards to her hip except for some soreness localized over the lateral hip.  Family was present for evaluation by myself yesterday afternoon and we discussed possible surgical interventions to reduce future dislocations as noted below.  Objective: Vital signs in last 24 hours: Temp:  [97.4 F (36.3 C)-98.1 F (36.7 C)] 97.6 F (36.4 C) (07/31 1545) Pulse Rate:  [54-75] 75 (07/31 1545) Resp:  [16-20] 16 (07/31 1545) BP: (125-163)/(57-65) 126/64 (07/31 1545) SpO2:  [97 %-100 %] 97 % (07/31 1545)  Intake/Output from previous day: 07/30 0701 - 07/31 0700 In: 200 [I.V.:200] Out: 1600 [Urine:1600] Intake/Output this shift: Total I/O In: -  Out: 200 [Urine:200]  Recent Labs    02/25/23 1857 02/26/23 0506  HGB 12.6 11.8*   Recent Labs    02/25/23 1857 02/26/23 0506  WBC 7.7 8.6  RBC 4.35 4.08  HCT 39.4 36.3  PLT 343 292   Recent Labs    02/25/23 1857 02/26/23 0506  NA 135 138  K 3.0* 4.1  CL 104 108  CO2 25 23  BUN 16 14  CREATININE 0.60 0.60  GLUCOSE 113* 101*  CALCIUM 9.0 8.9   No results for input(s): "LABPT", "INR" in the last 72  hours.  Physical Exam: Right lower extremity Skin intact with a well-healed incision with no signs of infection over the right posterior lateral hip Mild tenderness palpation over the lateral hip in the musculature There is a palpable lack of bony structure to the superior lateral hip with a soft area in the area of the normal greater trochanter Leg lengths are slightly short on the right but overall generally the same Patient is able to dorsiflex and plantarflex the foot Dorsalis pedis pulse intact   X-rays: X-rays show patient be status post total hip arthroplasty with severe superior lateral femoral bone loss with complete absence of the greater trochanter and superior lateral bone.  Distal stem appears well-fixed with a questionable lucent line around it no obvious loosening compared to prior films.  Acetabulum component is mildly protrusio with 2 screws which are slightly penetrating the inner table unchanged from prior films.  There is diffuse osteopenia.  There is minimal anteversion to the acetabular component and it is slightly vertical.  Assessment: Recurrent right hip dislocations  Plan:  is an 89 year old female presents with recurrent right hip dislocations despite multiple reductions and attempts at bracing.  I do long discussion with the patient and her family about treatment options including nonoperative and operative interventions for recurrent dislocations.  At this point they feel that they have exhausted all conservative measures and would like to proceed with a surgical intervention to reduce future dislocations.  We discussed the possibility of converting her  to a constrained liner which would reduce the risk of future dislocation.  Given the length of time since the surgery we have contacted hospitals in [place] and the old [facility] records have been unable to find the original operative reports.  We plan to have several systems available at the time of surgery in order to  have a ball which will sit on her femoral component and a constrained liner which we will plan to cement into the cup if we are unable to find a matching liner. The xrays were reviewed with the patient and family and the implants were discussed. The ability to secure the implant utilizing cement/ or cementless (press fit) fixation was discussed. Surgical exposures were discussed with the patient.    The hospitalization and post-operative care and rehabilitation were also discussed. The use of perioperative antibiotics and DVT prophylaxis were discussed. The risk, benefits and alternatives to a surgical intervention were discussed at length with the patient. The patient and family was also advised of risks related to the medical comorbidities. A lengthy discussion took place to review the most common complications including but not limited to: deep vein thrombosis, pulmonary embolus, heart attack, stroke, infection, wound breakdown, dislocation, numbness, leg length in-equality, damage to nerves, intraoperative fracture, tendon,muscles, arteries or other blood vessels, death and other possible complications from anesthesia. The patient was told that we will take steps to minimize these risks by using sterile technique, antibiotics and DVT prophylaxis when appropriate and follow the patient postoperatively in the office setting to monitor progress. The possibility of recurrent pain, no improvement in pain and actual worsening of pain were also discussed with the patient. The risk of recurrent dislocation following surgery was discussed and potential precautions to prevent dislocation were reviewed.  This is likely the highest risk after the surgery.  There is also possibility that the component will be loose in the femur and we may have to revise that component and we will have equipment available to do so if need be during the surgery.     The benefits of surgery were discussed with the patient and family  including the potential for improving the patient's current clinical condition through operative intervention. Alternatives to surgical intervention including conservative management were also discussed in detail. All questions were answered to the satisfaction of the patient and family. The patient and family participated and agreed to the plan of care as well as the use of the recommended implants for their surgery.    Plan for surgery tomorrow 02/28/2023 N.p.o. after midnight for the operating room Hold anticoagulation after midnight    Reinaldo Berber 02/27/2023, 4:26 PM

## 2023-02-27 NOTE — Plan of Care (Signed)

## 2023-02-27 NOTE — Progress Notes (Signed)
  Progress Note   Patient: Marilyn Romero LOV:[id number] DOB: Feb 04, 1934 DOA: 02/26/2023     1 DOS: the patient was seen and examined on 02/27/2023   Brief hospital course: 89 y.o. Caucasian female with medical history significant for GERD, diverticulitis, lumbar radiculopathy, and essential hypertension, who presented to the emergency room l with right hip nontraumatic dislocation.  This is the fifth time patient had her prosthetic right hip joint dislocation. Closed reduction in the OR is performed on 7/30.  Revision of the right hip is scheduled on Thursday.  Assessment and Plan: * Recurrent dislocation of right hip Patient and close posterior right prosthetic hip reduction on 7/30.  Planning on hip revision surgery on 8/1 if hardware obtained.  Essential hypertension On lisinopril  DDD (degenerative disc disease), lumbar Continue working with physical therapy.        Subjective: Patient seen this morning and feels okay.  Offers no complaints.  Came in with hip dislocation.  Physical Exam: Vitals:   02/26/23 1952 02/27/23 0044 02/27/23 0415 02/27/23 0824  BP: (!) 125/57 (!) 145/65 (!) 163/64 (!) 158/59  Pulse: 62 (!) 58 (!) 54 65  Resp: 20 20 20 16   Temp: 98.1 F (36.7 C) (!) 97.4 F (36.3 C) (!) 97.5 F (36.4 C) (!) 97.5 F (36.4 C)  TempSrc:      SpO2: 98% 100% 100% 100%  Weight:      Height:       Physical Exam HENT:     Head: Normocephalic.     Mouth/Throat:     Pharynx: No oropharyngeal exudate.  Eyes:     General: Lids are normal.     Conjunctiva/sclera: Conjunctivae normal.  Cardiovascular:     Rate and Rhythm: Normal rate and regular rhythm.     Heart sounds: Normal heart sounds, S1 normal and S2 normal.  Pulmonary:     Breath sounds: No decreased breath sounds, wheezing, rhonchi or rales.  Abdominal:     Palpations: Abdomen is soft.     Tenderness: There is no abdominal tenderness.  Musculoskeletal:     Right lower leg: No swelling.     Left lower  leg: No swelling.  Skin:    General: Skin is warm.     Findings: No rash.  Neurological:     Mental Status: She is alert and oriented to person, place, and time.     Data Reviewed: Hemoglobin 11.8, creatinine 0.6, CRP 0.5  Family Communication: Left message for patient's daughter.  Disposition: Status is: Inpatient Remains inpatient appropriate because: Orthopedic surgery planning on revision of hip since it keeps on dislocating.  Planned Discharge Destination: Rehab    Time spent: 28 minutes  Author: Alford Highland, MD 02/27/2023 1:47 PM  For on call review [email or web address].

## 2023-02-27 NOTE — Evaluation (Signed)
Occupational Therapy Evaluation Patient Details Name: Marilyn Romero MRN: [id number] DOB: 01/12/1934 Today's Date: 02/27/2023   History of Present Illness Pt is an 89 y/o F who presents with R hip dislocation (multiple dislocations/reductions since original surgery). PMH of HTN, OA, HLD, falls, GERD, colon CA. Plan for open reduction 8/1.   Clinical Impression   Patient received for OT evaluation. See flowsheet below for details of function. Generally, patient requiring supervision/CGA for bed mobility, CGA-MIN A for functional transfer with RW, and set up-MOD A for ADLs. Patient will benefit from continued OT while in acute care.      Recommendations for follow up therapy are one component of a multi-disciplinary discharge planning process, led by the attending physician.  Recommendations may be updated based on patient status, additional functional criteria and insurance authorization.   Assistance Recommended at Discharge Frequent or constant Supervision/Assistance  Patient can return home with the following A little help with walking and/or transfers;A lot of help with bathing/dressing/bathroom;Assistance with cooking/housework;Direct supervision/assist for medications management;Direct supervision/assist for financial management;Assist for transportation;Help with stairs or ramp for entrance    Functional Status Assessment  Patient has had a recent decline in their functional status and demonstrates the ability to make significant improvements in function in a reasonable and predictable amount of time.  Equipment Recommendations  Other (comment) (defer to next venue)    Recommendations for Other Services       Precautions / Restrictions Precautions Precautions: None Precaution Comments: per MD, no restrictions at this time. S/p closed reduction 7/30 Restrictions Weight Bearing Restrictions: No      Mobility Bed Mobility Overal bed mobility: Needs Assistance Bed Mobility:  Supine to Sit     Supine to sit: Min guard     General bed mobility comments: with extra time    Transfers Overall transfer level: Needs assistance Equipment used: Rolling walker (2 wheels) Transfers: Sit to/from Stand Sit to Stand: Min guard, Min assist           General transfer comment: needing cues for hand placement; better with R hand on RW first since unable to flex R shoulder.      Balance Overall balance assessment: Mild deficits observed, not formally tested                                         ADL either performed or assessed with clinical judgement   ADL Overall ADL's : Needs assistance/impaired   Eating/Feeding Details (indicate cue type and reason): pt will need assist with set up/cutting food due to RUE deficits. Grooming: Brushing hair;Oral care;Set up;Sitting Grooming Details (indicate cue type and reason): using L hand             Lower Body Dressing: Moderate assistance Lower Body Dressing Details (indicate cue type and reason): assist for threading since pt just dislocated hip recently; precaution taken so pt didn't lean far forward Toilet Transfer: Minimal assistance;BSC/3in1 Toilet Transfer Details (indicate cue type and reason): handheld assist and cues for stand pivot t/f to [facility] Toileting- Clothing Manipulation and Hygiene: Min guard;Cueing for safety;Sit to/from stand Toileting - Clothing Manipulation Details (indicate cue type and reason): OT assisted for steadying; pt able to pull up underwear with L arm. Pt able to perform toilet hygiene from seated.     Functional mobility during ADLs: Min guard;Minimal assistance;Rolling walker (2 wheels) General ADL Comments: Pt needing cues  and CGA-MIN A for most ADLs     Vision         Perception     Praxis      Pertinent Vitals/Pain Pain Assessment Pain Assessment: 0-10 Pain Score:  ("it hurts a little bit when I stand") Pain Location: R hip Pain Descriptors /  Indicators: Aching Pain Intervention(s): Limited activity within patient's tolerance, Monitored during session     Hand Dominance  (pt's R hand is minimally functional, so uses L hand mostly.)   Extremity/Trunk Assessment Upper Extremity Assessment Upper Extremity Assessment: RUE deficits/detail RUE Deficits / Details: Pt with hx of RUE injury (pt unable to accurately describe) and therefore R hand is minimally functional; Pt able to make weak grip; unable to flex R shoulder; has to place R hand on RW with L hand.   Lower Extremity Assessment Lower Extremity Assessment: Generalized weakness       Communication Communication Communication: HOH   Cognition Arousal/Alertness: Awake/alert Behavior During Therapy: WFL for tasks assessed/performed Overall Cognitive Status: No family/caregiver present to determine baseline cognitive functioning                                 General Comments: Pt is alert; orinted to self, the fact that her hip has dislocated, and "[facility]"; knows that it is Wednesday; does not know month, date, or year.     General Comments  On room air.    Exercises     Shoulder Instructions      Home Living Family/patient expects to be discharged to:: Skilled nursing facility                                 Additional Comments: Pt reports that she lives in an apartment, uses RW "recently". However, case management note (from discussion with daughter) states pt was at [facility] prior to admission.      Prior Functioning/Environment Prior Level of Function : Patient poor historian/Family not available             Mobility Comments: Pt reports using RW "recently". Need to clarify with family. ADLs Comments: Pt reports (I) with ADL/IADL/driving. Need to clarify with family. Pt is not oriented to date, so questionable historian.        OT Problem List: Decreased range of motion;Impaired balance (sitting and/or  standing);Decreased activity tolerance;Decreased safety awareness      OT Treatment/Interventions: Self-care/ADL training;Therapeutic exercise;DME and/or AE instruction;Patient/family education;Therapeutic activities    OT Goals(Current goals can be found in the care plan section) Acute Rehab OT Goals Patient Stated Goal: Get better OT Goal Formulation: With patient Time For Goal Achievement: 03/13/23 Potential to Achieve Goals: Fair ADL Goals Pt Will Perform Lower Body Dressing: with modified independence;sit to/from stand Pt Will Transfer to Toilet: with modified independence;ambulating;bedside commode Pt Will Perform Toileting - Clothing Manipulation and hygiene: with modified independence;sit to/from stand  OT Frequency: Min 1X/week    Co-evaluation PT/OT/SLP Co-Evaluation/Treatment: Yes Reason for Co-Treatment: Complexity of the patient's impairments (multi-system involvement);For patient/therapist safety;To address functional/ADL transfers   OT goals addressed during session: ADL's and self-care;Proper use of Adaptive equipment and DME      AM-PAC OT "6 Clicks" Daily Activity     Outcome Measure Help from another person eating meals?: A Little Help from another person taking care of personal grooming?: A Little Help from another person  toileting, which includes using toliet, bedpan, or urinal?: A Little Help from another person bathing (including washing, rinsing, drying)?: A Lot Help from another person to put on and taking off regular upper body clothing?: A Little Help from another person to put on and taking off regular lower body clothing?: A Lot 6 Click Score: 16   End of Session Equipment Utilized During Treatment: Rolling walker (2 wheels) Nurse Communication: Mobility status;Other (comment) (pt unlikely to call for assistance 2/2 cognition)  Activity Tolerance: Patient tolerated treatment well Patient left: in chair;with call bell/phone within reach;with chair  alarm set  OT Visit Diagnosis: Unsteadiness on feet (R26.81)                Time: 9563-8756 OT Time Calculation (min): 37 min Charges:  OT General Charges $OT Visit: 1 Visit OT Evaluation $OT Eval Moderate Complexity: 1 Mod OT Treatments $Self Care/Home Management : 8-22 mins  Linward Foster, MS, OTR/L  Alvester Morin 02/27/2023, 12:07 PM

## 2023-02-27 NOTE — H&P (View-Only) (Signed)
Subjective: Marilyn Romero is an 89 year old female with a history of dementia, hypertension, gastroesophageal reflux disease, and history of sigmoid colon cancer who is now 16 years status post a right total hip arthroplasty.  The patient has had frequent recurrent posterior hip dislocations which have been increasing in frequency over the last few months.  She has had multiple reductions under sedation in the ER but it continues to dislocate with minimal physical effort sometimes just as simple as turning to face a different direction while sitting on a chair.  She is been treated with conservative measures including abduction braces and knee immobilizers but continues to dislocate which is compounded by incomplete brace use while in skilled nursing facilities and nursing homes.  She was reduced again yesterday and is now feeling okay with regards to her hip except for some soreness localized over the lateral hip.  Family was present for evaluation by myself yesterday afternoon and we discussed possible surgical interventions to reduce future dislocations as noted below.  Objective: Vital signs in last 24 hours: Temp:  [97.4 F (36.3 C)-98.1 F (36.7 C)] 97.6 F (36.4 C) (07/31 1545) Pulse Rate:  [54-75] 75 (07/31 1545) Resp:  [16-20] 16 (07/31 1545) BP: (125-163)/(57-65) 126/64 (07/31 1545) SpO2:  [97 %-100 %] 97 % (07/31 1545)  Intake/Output from previous day: 07/30 0701 - 07/31 0700 In: 200 [I.V.:200] Out: 1600 [Urine:1600] Intake/Output this shift: Total I/O In: -  Out: 200 [Urine:200]  Recent Labs    02/25/23 1857 02/26/23 0506  HGB 12.6 11.8*   Recent Labs    02/25/23 1857 02/26/23 0506  WBC 7.7 8.6  RBC 4.35 4.08  HCT 39.4 36.3  PLT 343 292   Recent Labs    02/25/23 1857 02/26/23 0506  NA 135 138  K 3.0* 4.1  CL 104 108  CO2 25 23  BUN 16 14  CREATININE 0.60 0.60  GLUCOSE 113* 101*  CALCIUM 9.0 8.9   No results for input(s): "LABPT", "INR" in the last 72  hours.  Physical Exam: Right lower extremity Skin intact with a well-healed incision with no signs of infection over the right posterior lateral hip Mild tenderness palpation over the lateral hip in the musculature There is a palpable lack of bony structure to the superior lateral hip with a soft area in the area of the normal greater trochanter Leg lengths are slightly short on the right but overall generally the same Patient is able to dorsiflex and plantarflex the foot Dorsalis pedis pulse intact   X-rays: X-rays show patient be status post total hip arthroplasty with severe superior lateral femoral bone loss with complete absence of the greater trochanter and superior lateral bone.  Distal stem appears well-fixed with a questionable lucent line around it no obvious loosening compared to prior films.  Acetabulum component is mildly protrusio with 2 screws which are slightly penetrating the inner table unchanged from prior films.  There is diffuse osteopenia.  There is minimal anteversion to the acetabular component and it is slightly vertical.  Assessment: Recurrent right hip dislocations  Plan:  is an 89 year old female presents with recurrent right hip dislocations despite multiple reductions and attempts at bracing.  I do long discussion with the patient and her family about treatment options including nonoperative and operative interventions for recurrent dislocations.  At this point they feel that they have exhausted all conservative measures and would like to proceed with a surgical intervention to reduce future dislocations.  We discussed the possibility of converting her  to a constrained liner which would reduce the risk of future dislocation.  Given the length of time since the surgery we have contacted hospitals in [place] and the old [facility] records have been unable to find the original operative reports.  We plan to have several systems available at the time of surgery in order to  have a ball which will sit on her femoral component and a constrained liner which we will plan to cement into the cup if we are unable to find a matching liner. The xrays were reviewed with the patient and family and the implants were discussed. The ability to secure the implant utilizing cement/ or cementless (press fit) fixation was discussed. Surgical exposures were discussed with the patient.    The hospitalization and post-operative care and rehabilitation were also discussed. The use of perioperative antibiotics and DVT prophylaxis were discussed. The risk, benefits and alternatives to a surgical intervention were discussed at length with the patient. The patient and family was also advised of risks related to the medical comorbidities. A lengthy discussion took place to review the most common complications including but not limited to: deep vein thrombosis, pulmonary embolus, heart attack, stroke, infection, wound breakdown, dislocation, numbness, leg length in-equality, damage to nerves, intraoperative fracture, tendon,muscles, arteries or other blood vessels, death and other possible complications from anesthesia. The patient was told that we will take steps to minimize these risks by using sterile technique, antibiotics and DVT prophylaxis when appropriate and follow the patient postoperatively in the office setting to monitor progress. The possibility of recurrent pain, no improvement in pain and actual worsening of pain were also discussed with the patient. The risk of recurrent dislocation following surgery was discussed and potential precautions to prevent dislocation were reviewed.  This is likely the highest risk after the surgery.  There is also possibility that the component will be loose in the femur and we may have to revise that component and we will have equipment available to do so if need be during the surgery.     The benefits of surgery were discussed with the patient and family  including the potential for improving the patient's current clinical condition through operative intervention. Alternatives to surgical intervention including conservative management were also discussed in detail. All questions were answered to the satisfaction of the patient and family. The patient and family participated and agreed to the plan of care as well as the use of the recommended implants for their surgery.    Plan for surgery tomorrow 02/28/2023 N.p.o. after midnight for the operating room Hold anticoagulation after midnight    Reinaldo Berber 02/27/2023, 4:26 PM

## 2023-02-28 ENCOUNTER — Encounter: Payer: Self-pay | Admitting: Family Medicine

## 2023-02-28 ENCOUNTER — Inpatient Hospital Stay: Payer: 59 | Admitting: Anesthesiology

## 2023-02-28 ENCOUNTER — Inpatient Hospital Stay: Payer: Self-pay | Admitting: Anesthesiology

## 2023-02-28 ENCOUNTER — Inpatient Hospital Stay: Payer: 59

## 2023-02-28 ENCOUNTER — Encounter
Admission: EM | Disposition: A | Discharge status: Skilled Nursing Facility | Payer: Self-pay | Source: Other Acute Inpatient Hospital | Attending: Internal Medicine

## 2023-02-28 ENCOUNTER — Other Ambulatory Visit: Payer: Self-pay

## 2023-02-28 DIAGNOSIS — M5136 Other intervertebral disc degeneration, lumbar region: Secondary | ICD-10-CM | POA: Diagnosis not present

## 2023-02-28 DIAGNOSIS — I10 Essential (primary) hypertension: Secondary | ICD-10-CM | POA: Diagnosis not present

## 2023-02-28 DIAGNOSIS — M24451 Recurrent dislocation, right hip: Secondary | ICD-10-CM | POA: Diagnosis not present

## 2023-02-28 HISTORY — PX: TOTAL HIP ARTHROPLASTY: SHX124

## 2023-02-28 LAB — AEROBIC/ANAEROBIC CULTURE W GRAM STAIN (SURGICAL/DEEP WOUND)

## 2023-02-28 SURGERY — ARTHROPLASTY, HIP, TOTAL,POSTERIOR APPROACH
Anesthesia: Spinal | Site: Hip | Laterality: Right

## 2023-02-28 MED ORDER — BUPIVACAINE HCL (PF) 0.5 % IJ SOLN
INTRAMUSCULAR | Status: DC | PRN
  Administered 2023-02-28: 2.5 mL via INTRATHECAL

## 2023-02-28 MED ORDER — FENTANYL CITRATE (PF) 100 MCG/2ML IJ SOLN: 25.0000 ug | INTRAMUSCULAR | Status: DC | PRN

## 2023-02-28 MED ORDER — SODIUM CHLORIDE FLUSH 0.9 % IV SOLN
INTRAVENOUS | Status: AC
  Filled 2023-02-28: qty 10

## 2023-02-28 MED ORDER — TRANEXAMIC ACID 1000 MG/10ML IV SOLN
INTRAVENOUS | Status: AC
  Filled 2023-02-28: qty 10

## 2023-02-28 MED ORDER — LIDOCAINE HCL (PF) 2 % IJ SOLN
INTRAMUSCULAR | Status: AC
  Filled 2023-02-28: qty 5

## 2023-02-28 MED ORDER — BUPIVACAINE HCL (PF) 0.5 % IJ SOLN
INTRAMUSCULAR | Status: AC
  Filled 2023-02-28: qty 10

## 2023-02-28 MED ORDER — LACTATED RINGERS IV SOLN: INTRAVENOUS | Status: DC | PRN

## 2023-02-28 MED ORDER — TRANEXAMIC ACID-NACL 1000-0.7 MG/100ML-% IV SOLN
INTRAVENOUS | Status: AC
  Filled 2023-02-28: qty 200

## 2023-02-28 MED ORDER — ONDANSETRON HCL 4 MG/2ML IJ SOLN
INTRAMUSCULAR | Status: AC
  Filled 2023-02-28: qty 2

## 2023-02-28 MED ORDER — CEFAZOLIN SODIUM-DEXTROSE 2-4 GM/100ML-% IV SOLN
INTRAVENOUS | Status: AC
  Filled 2023-02-28: qty 100

## 2023-02-28 MED ORDER — ONDANSETRON HCL 4 MG PO TABS: 4.0000 mg | ORAL_TABLET | Freq: Four times a day (QID) | ORAL | Status: DC | PRN

## 2023-02-28 MED ORDER — PHENOL 1.4 % MT LIQD: 1.0000 | OROMUCOSAL | Status: DC | PRN

## 2023-02-28 MED ORDER — KETAMINE HCL 10 MG/ML IJ SOLN
INTRAMUSCULAR | Status: DC | PRN
  Administered 2023-02-28 (×2): 10 mg via INTRAVENOUS

## 2023-02-28 MED ORDER — DEXAMETHASONE SODIUM PHOSPHATE 10 MG/ML IJ SOLN
INTRAMUSCULAR | Status: DC | PRN
  Administered 2023-02-28: 8 mg via INTRAVENOUS

## 2023-02-28 MED ORDER — DEXAMETHASONE SODIUM PHOSPHATE 10 MG/ML IJ SOLN
INTRAMUSCULAR | Status: AC
  Filled 2023-02-28: qty 1

## 2023-02-28 MED ORDER — ACETAMINOPHEN 325 MG PO TABS: 325.0000 mg | ORAL_TABLET | Freq: Four times a day (QID) | ORAL | Status: DC | PRN

## 2023-02-28 MED ORDER — KETAMINE HCL 50 MG/5ML IJ SOSY
PREFILLED_SYRINGE | INTRAMUSCULAR | Status: AC
  Filled 2023-02-28: qty 5

## 2023-02-28 MED ORDER — CEFAZOLIN SODIUM-DEXTROSE 2-3 GM-%(50ML) IV SOLR
INTRAVENOUS | Status: DC | PRN
Start: 2023-02-28 — End: 2023-02-28
  Administered 2023-02-28: 2 g via INTRAVENOUS

## 2023-02-28 MED ORDER — ACETAMINOPHEN 500 MG PO TABS
500.0000 mg | ORAL_TABLET | Freq: Four times a day (QID) | ORAL | Status: AC
  Administered 2023-02-28 – 2023-03-01 (×4): 500 mg via ORAL
  Filled 2023-02-28 (×4): qty 1

## 2023-02-28 MED ORDER — PROPOFOL 1000 MG/100ML IV EMUL
INTRAVENOUS | Status: AC
  Filled 2023-02-28: qty 100

## 2023-02-28 MED ORDER — CEFAZOLIN SODIUM-DEXTROSE 2-4 GM/100ML-% IV SOLN
2.0000 g | Freq: Four times a day (QID) | INTRAVENOUS | Status: AC
  Administered 2023-02-28 – 2023-03-01 (×2): 2 g via INTRAVENOUS
  Filled 2023-02-28 (×2): qty 100

## 2023-02-28 MED ORDER — ONDANSETRON HCL 4 MG/2ML IJ SOLN: 4.0000 mg | Freq: Four times a day (QID) | INTRAMUSCULAR | Status: DC | PRN

## 2023-02-28 MED ORDER — MORPHINE SULFATE (PF) 2 MG/ML IV SOLN: 0.5000 mg | INTRAVENOUS | Status: DC | PRN

## 2023-02-28 MED ORDER — BUPIVACAINE LIPOSOME 1.3 % IJ SUSP
INTRAMUSCULAR | Status: AC
  Filled 2023-02-28: qty 20

## 2023-02-28 MED ORDER — PROPOFOL 10 MG/ML IV BOLUS
INTRAVENOUS | Status: DC | PRN
  Administered 2023-02-28: 60 ug/kg/min via INTRAVENOUS
  Administered 2023-02-28: 10 mg via INTRAVENOUS

## 2023-02-28 MED ORDER — ONDANSETRON HCL 4 MG/2ML IJ SOLN
INTRAMUSCULAR | Status: DC | PRN
  Administered 2023-02-28: 4 mg via INTRAVENOUS

## 2023-02-28 MED ORDER — BUPIVACAINE HCL (PF) 0.25 % IJ SOLN
INTRAMUSCULAR | Status: AC
  Filled 2023-02-28: qty 30

## 2023-02-28 MED ORDER — EPINEPHRINE PF 1 MG/ML IJ SOLN
INTRAMUSCULAR | Status: AC
  Filled 2023-02-28: qty 1

## 2023-02-28 MED ORDER — BUPIVACAINE-EPINEPHRINE (PF) 0.25% -1:200000 IJ SOLN
INTRAMUSCULAR | Status: AC
  Filled 2023-02-28: qty 30

## 2023-02-28 MED ORDER — SODIUM CHLORIDE (PF) 0.9 % IJ SOLN
INTRAMUSCULAR | Status: DC | PRN
  Administered 2023-02-28: 50 mL via INTRAMUSCULAR

## 2023-02-28 MED ORDER — SURGIPHOR WOUND IRRIGATION SYSTEM - OPTIME: TOPICAL | Status: DC | PRN

## 2023-02-28 MED ORDER — METOCLOPRAMIDE HCL 5 MG PO TABS: 5.0000 mg | ORAL_TABLET | Freq: Three times a day (TID) | ORAL | Status: DC | PRN

## 2023-02-28 MED ORDER — EPHEDRINE 5 MG/ML INJ
INTRAVENOUS | Status: AC
  Filled 2023-02-28: qty 5

## 2023-02-28 MED ORDER — LIDOCAINE HCL (CARDIAC) PF 100 MG/5ML IV SOSY
PREFILLED_SYRINGE | INTRAVENOUS | Status: DC | PRN
  Administered 2023-02-28: 40 mg via INTRAVENOUS

## 2023-02-28 MED ORDER — PHENYLEPHRINE HCL-NACL 20-0.9 MG/250ML-% IV SOLN
INTRAVENOUS | Status: DC | PRN
  Administered 2023-02-28: 40 ug/min via INTRAVENOUS

## 2023-02-28 MED ORDER — HYDROCODONE-ACETAMINOPHEN 5-325 MG PO TABS: 1.0000 | ORAL_TABLET | ORAL | Status: DC | PRN

## 2023-02-28 MED ORDER — PHENYLEPHRINE 80 MCG/ML (10ML) SYRINGE FOR IV PUSH (FOR BLOOD PRESSURE SUPPORT)
PREFILLED_SYRINGE | INTRAVENOUS | Status: AC
  Filled 2023-02-28: qty 10

## 2023-02-28 MED ORDER — HYDROCODONE-ACETAMINOPHEN 7.5-325 MG PO TABS: 1.0000 | ORAL_TABLET | ORAL | Status: DC | PRN

## 2023-02-28 MED ORDER — TRANEXAMIC ACID-NACL 1000-0.7 MG/100ML-% IV SOLN
INTRAVENOUS | Status: DC | PRN
  Administered 2023-02-28 (×2): 1000 mg via INTRAVENOUS

## 2023-02-28 MED ORDER — SODIUM CHLORIDE 0.9 % IR SOLN
Status: DC | PRN
  Administered 2023-02-28: 3000 mL
  Administered 2023-02-28: 100 mL

## 2023-02-28 MED ORDER — EPHEDRINE SULFATE (PRESSORS) 50 MG/ML IJ SOLN
INTRAMUSCULAR | Status: DC | PRN
  Administered 2023-02-28: 10 mg via INTRAVENOUS

## 2023-02-28 MED ORDER — ENOXAPARIN SODIUM 40 MG/0.4ML IJ SOSY
40.0000 mg | PREFILLED_SYRINGE | INTRAMUSCULAR | Status: DC
  Administered 2023-03-01 – 2023-03-04 (×4): 40 mg via SUBCUTANEOUS
  Filled 2023-02-28 (×4): qty 0.4

## 2023-02-28 MED ORDER — MENTHOL 3 MG MT LOZG: 1.0000 | LOZENGE | OROMUCOSAL | Status: DC | PRN

## 2023-02-28 MED ORDER — DOCUSATE SODIUM 100 MG PO CAPS
100.0000 mg | ORAL_CAPSULE | Freq: Two times a day (BID) | ORAL | Status: DC
  Administered 2023-02-28 – 2023-03-04 (×8): 100 mg via ORAL
  Filled 2023-02-28 (×8): qty 1

## 2023-02-28 MED ORDER — METOCLOPRAMIDE HCL 5 MG/ML IJ SOLN: 5.0000 mg | Freq: Three times a day (TID) | INTRAMUSCULAR | Status: DC | PRN

## 2023-02-28 SURGICAL SUPPLY — 78 items
ADH SKN CLS APL DERMABOND .7 (GAUZE/BANDAGES/DRESSINGS) ×1
APL PRP STRL LF DISP 70% ISPRP (MISCELLANEOUS) ×2
BLADE SAGITTAL AGGR TOOTH XLG (BLADE) ×1 IMPLANT
BNDG CMPR 5X6 CHSV STRCH STRL (GAUZE/BANDAGES/DRESSINGS) ×1
BNDG COHESIVE 6X5 TAN ST LF (GAUZE/BANDAGES/DRESSINGS) ×1 IMPLANT
BOWL CEMENT MIX W/ADAPTER (MISCELLANEOUS) IMPLANT
BUR DIAMOND COARSE 4.0 RND (BURR) IMPLANT
CEMENT BONE R 1X40 (Cement) IMPLANT
CHLORAPREP W/TINT 26 (MISCELLANEOUS) ×2 IMPLANT
CNTNR URN SCR LID CUP LEK RST (MISCELLANEOUS) IMPLANT
CONT SPEC 4OZ STRL OR WHT (MISCELLANEOUS) ×1
COVER BACK TABLE REUSABLE LG (DRAPES) ×1 IMPLANT
COVER SET STULBERG POSITIONER (MISCELLANEOUS) ×1 IMPLANT
DERMABOND ADVANCED .7 DNX12 (GAUZE/BANDAGES/DRESSINGS) ×1 IMPLANT
DRAPE 3/4 80X56 (DRAPES) ×1 IMPLANT
DRAPE IMP U-DRAPE 54X76 (DRAPES) ×1 IMPLANT
DRAPE INCISE IOBAN 66X60 STRL (DRAPES) ×1 IMPLANT
DRAPE POUCH INSTRU U-SHP 10X18 (DRAPES) ×1 IMPLANT
DRAPE U-SHAPE 47X51 STRL (DRAPES) ×1 IMPLANT
DRSG MEPILEX SACRM 8.7X9.8 (GAUZE/BANDAGES/DRESSINGS) ×1 IMPLANT
DRSG OPSITE POSTOP 4X10 (GAUZE/BANDAGES/DRESSINGS) IMPLANT
DRSG OPSITE POSTOP 4X12 (GAUZE/BANDAGES/DRESSINGS) IMPLANT
DRSG OPSITE POSTOP 4X8 (GAUZE/BANDAGES/DRESSINGS) IMPLANT
ELECT REM PT RETURN 9FT ADLT (ELECTROSURGICAL) ×1
ELECTRODE REM PT RTRN 9FT ADLT (ELECTROSURGICAL) ×1 IMPLANT
GLOVE BIO SURGEON STRL SZ8 (GLOVE) ×1 IMPLANT
GLOVE BIOGEL PI IND STRL 8 (GLOVE) ×1 IMPLANT
GLOVE PI ORTHO PRO STRL 7.5 (GLOVE) ×2 IMPLANT
GLOVE PI ORTHO PRO STRL SZ8 (GLOVE) ×2 IMPLANT
GLOVE SURG SYN 7.5 E (GLOVE) ×2 IMPLANT
GLOVE SURG SYN 7.5 PF PI (GLOVE) ×2 IMPLANT
GOWN SRG XL LVL 3 NONREINFORCE (GOWNS) ×1 IMPLANT
GOWN STRL NON-REIN TWL XL LVL3 (GOWNS) ×1
GOWN STRL REUS W/ TWL LRG LVL3 (GOWN DISPOSABLE) ×1 IMPLANT
GOWN STRL REUS W/ TWL XL LVL3 (GOWN DISPOSABLE) ×1 IMPLANT
GOWN STRL REUS W/TWL LRG LVL3 (GOWN DISPOSABLE) ×1
GOWN STRL REUS W/TWL XL LVL3 (GOWN DISPOSABLE) ×1
HANDLE YANKAUER SUCT OPEN TIP (MISCELLANEOUS) ×1 IMPLANT
HEAD FRDM CONST 12/14 32D +0 (Head) IMPLANT
HOOD PEEL AWAY T7 (MISCELLANEOUS) ×2 IMPLANT
IV NS 100ML SINGLE PACK (IV SOLUTION) ×1 IMPLANT
IV NS IRRIG 3000ML ARTHROMATIC (IV SOLUTION) ×1 IMPLANT
KIT TURNOVER KIT A (KITS) ×1 IMPLANT
KNIFE SCULPS 14X20 (INSTRUMENTS) IMPLANT
LINER G7 FRDM CONST 32 C (Liner) IMPLANT
LINER G7 FRDM CONST 32MM C (Liner) ×1 IMPLANT
MANIFOLD NEPTUNE II (INSTRUMENTS) ×1 IMPLANT
MARKER SKIN DUAL TIP RULER LAB (MISCELLANEOUS) ×1 IMPLANT
MAT ABSORB FLUID 56X50 GRAY (MISCELLANEOUS) ×1 IMPLANT
NDL FILTER BLUNT 18X1 1/2 (NEEDLE) ×1 IMPLANT
NDL SAFETY ECLIP 18X1.5 (MISCELLANEOUS) ×1 IMPLANT
NDL SPNL 20GX3.5 QUINCKE YW (NEEDLE) ×1 IMPLANT
NEEDLE FILTER BLUNT 18X1 1/2 (NEEDLE) ×1 IMPLANT
NEEDLE SPNL 20GX3.5 QUINCKE YW (NEEDLE) ×1 IMPLANT
PACK HIP PROSTHESIS (MISCELLANEOUS) ×1 IMPLANT
PAD ARMBOARD 7.5X6 YLW CONV (MISCELLANEOUS) ×2 IMPLANT
PENCIL SMOKE EVACUATOR (MISCELLANEOUS) ×1 IMPLANT
PILLOW ABDUCTION FOAM SM (MISCELLANEOUS) ×1 IMPLANT
PULSAVAC PLUS IRRIG FAN TIP (DISPOSABLE) ×1
RETRIEVER SUT HEWSON (MISCELLANEOUS) ×1 IMPLANT
SLEEVE SCD COMPRESS KNEE MED (STOCKING) ×1 IMPLANT
SOLUTION IRRIG SURGIPHOR (IV SOLUTION) ×1 IMPLANT
SUT BONE WAX W31G (SUTURE) ×1 IMPLANT
SUT DVC 2 QUILL PDO T11 36X36 (SUTURE) ×1 IMPLANT
SUT ETHIBOND #5 BRAIDED 30INL (SUTURE) ×1 IMPLANT
SUT QUILL MONODERM 3-0 PS-2 (SUTURE) ×1 IMPLANT
SUT VIC AB 0 CT1 36 (SUTURE) ×1 IMPLANT
SUT VIC AB 2-0 CT2 27 (SUTURE) ×2 IMPLANT
SUT VICRYL 1-0 27IN ABS (SUTURE) ×1
SUTURE VICRYL 1-0 27IN ABS (SUTURE) ×1 IMPLANT
SYR 30ML LL (SYRINGE) ×2 IMPLANT
SYR TB 1ML LL NO SAFETY (SYRINGE) ×1 IMPLANT
TIP FAN IRRIG PULSAVAC PLUS (DISPOSABLE) ×1 IMPLANT
TOWEL OR 17X26 4PK STRL BLUE (TOWEL DISPOSABLE) IMPLANT
TRAP FLUID SMOKE EVACUATOR (MISCELLANEOUS) ×1 IMPLANT
TRAY FOLEY SLVR 16FR LF STAT (SET/KITS/TRAYS/PACK) IMPLANT
WAND WEREWOLF FASTSEAL 6.0 (MISCELLANEOUS) ×1 IMPLANT
WATER STERILE IRR 1000ML POUR (IV SOLUTION) ×1 IMPLANT

## 2023-02-28 NOTE — Plan of Care (Signed)
  Problem: Education: Goal: Knowledge of General Education information will improve Description: Including pain rating scale, medication(s)/side effects and non-pharmacologic comfort measures Outcome: Progressing   Problem: Health Behavior/Discharge Planning: Goal: Ability to manage health-related needs will improve Outcome: Progressing   Problem: Clinical Measurements: Goal: Ability to maintain clinical measurements within normal limits will improve Outcome: Progressing Goal: Will remain free from infection Outcome: Progressing Goal: Diagnostic test results will improve Outcome: Progressing Goal: Respiratory complications will improve Outcome: Progressing Goal: Cardiovascular complication will be avoided Outcome: Progressing   Problem: Pain Managment: Goal: General experience of comfort will improve Outcome: Progressing   Problem: Elimination: Goal: Will not experience complications related to bowel motility Outcome: Progressing Goal: Will not experience complications related to urinary retention Outcome: Progressing   Problem: Coping: Goal: Level of anxiety will decrease Outcome: Progressing   Problem: Safety: Goal: Ability to remain free from injury will improve Outcome: Progressing   Problem: Skin Integrity: Goal: Risk for impaired skin integrity will decrease Outcome: Progressing

## 2023-02-28 NOTE — Progress Notes (Signed)
  Progress Note   Patient: Marilyn Romero ZOX:[id number] DOB: 1934-03-17 DOA: 02/26/2023     2 DOS: the patient was seen and examined on 02/28/2023   Brief hospital course: 89 y.o. Caucasian female with medical history significant for GERD, diverticulitis, lumbar radiculopathy, and essential hypertension, who presented to the emergency room l with right hip nontraumatic dislocation.  This is the fifth time patient had her prosthetic right hip joint dislocation. Closed reduction in the OR is performed on 7/30.  Revision of the right hip is scheduled on 8/1.  Assessment and Plan: * Recurrent dislocation of right hip Patient and close posterior right prosthetic hip reduction on 7/30.  Hip revision surgery on 8/1.   Essential hypertension On lisinopril  DDD (degenerative disc disease), lumbar Continue working with physical therapy.        Subjective: Patient seen this morning prior to surgery.  Some soreness in the hip.  Otherwise offers no complaints.  Admitted with recurrent dislocation of right hip.  Physical Exam: Vitals:   02/27/23 1545 02/28/23 0141 02/28/23 0757 02/28/23 1206  BP: 126/64 (!) 160/52 133/72 (!) 144/72  Pulse: 75  72 75  Resp: 16 20 16 20   Temp: 97.6 F (36.4 C) 98 F (36.7 C) 98 F (36.7 C) 97.8 F (36.6 C)  TempSrc:    Tympanic  SpO2: 97% 98% 98% 99%  Weight:    49.9 kg  Height:    5\' 5"  (1.651 m)   Physical Exam HENT:     Head: Normocephalic.     Mouth/Throat:     Pharynx: No oropharyngeal exudate.  Eyes:     General: Lids are normal.     Conjunctiva/sclera: Conjunctivae normal.  Cardiovascular:     Rate and Rhythm: Normal rate and regular rhythm.     Heart sounds: S1 normal and S2 normal. Murmur heard.     Systolic murmur is present with a grade of 2/6.  Pulmonary:     Breath sounds: No decreased breath sounds, wheezing, rhonchi or rales.  Abdominal:     Palpations: Abdomen is soft.     Tenderness: There is no abdominal tenderness.   Musculoskeletal:     Right lower leg: No swelling.     Left lower leg: No swelling.  Skin:    General: Skin is warm.     Findings: No rash.  Neurological:     Mental Status: She is alert and oriented to person, place, and time.     Data Reviewed: No new data today  Disposition: Status is: Inpatient Remains inpatient appropriate because: For revision of right hip.  Planned Discharge Destination: Skilled nursing facility    Time spent: 27 minutes  Author: Alford Highland, MD 02/28/2023 1:20 PM  For on call review [email or web address].

## 2023-02-28 NOTE — Progress Notes (Signed)
PT Cancellation Note  Patient Details Name: Marilyn Romero MRN: [id number] DOB: 08/06/1933   Cancelled Treatment:    Reason Eval/Treat Not Completed: Patient at procedure or test/unavailable Patient in surgery. Will see as appropriate tomorrow.   Marilyn Romero 02/28/2023, 12:35 PM

## 2023-02-28 NOTE — Anesthesia Preprocedure Evaluation (Signed)
Anesthesia Evaluation  Patient identified by MRN, date of birth, ID band Patient awake    Reviewed: Allergy & Precautions, H&P , NPO status , Patient's Chart, lab work & pertinent test results, reviewed documented beta blocker date and time   History of Anesthesia Complications Negative for: history of anesthetic complications  Airway Mallampati: II  TM Distance: >3 FB Neck ROM: full    Dental  (+) Dental Advidsory Given, Chipped, Poor Dentition   Pulmonary neg pulmonary ROS   Pulmonary exam normal breath sounds clear to auscultation       Cardiovascular Exercise Tolerance: Good hypertension, (-) angina (-) Past MI and (-) Cardiac Stents Normal cardiovascular exam(-) dysrhythmias (-) Valvular Problems/Murmurs Rhythm:regular Rate:Normal     Neuro/Psych neg Seizures  Neuromuscular disease  negative psych ROS   GI/Hepatic Neg liver ROS, PUD,GERD  ,,  Endo/Other  negative endocrine ROS    Renal/GU negative Renal ROS  negative genitourinary   Musculoskeletal   Abdominal   Peds  Hematology negative hematology ROS (+)   Anesthesia Other Findings Past Medical History: 12/08/2014: Cancer of sigmoid colon (HCC) 10/07/2012: Colonic mass     Comment:  Overview:  Gets routine colonoscopies. Dx was 2013. 12/16/2014: DDD (degenerative disc disease), lumbar 06/27/2011: Diverticulitis 12/16/2014: Facet syndrome, lumbar 05/26/2012: GERD (gastroesophageal reflux disease) 06/18/2016: Insomnia     Comment:  Last Assessment & Plan:  Relevant Hx: Course: Daily               Update: Today's Plan: 04/29/2012: Lesion of bladder 10/04/2015: Lumbar radiculopathy 12/16/2014: Sacroiliac joint dysfunction of both sides 08/15/2015: Status post total replacement of right hip   Reproductive/Obstetrics negative OB ROS                             Anesthesia Physical Anesthesia Plan  ASA: 2  Anesthesia Plan: Spinal    Post-op Pain Management:    Induction: Intravenous  PONV Risk Score and Plan: 3 and TIVA and Propofol infusion  Airway Management Planned: Natural Airway and Simple Face Mask  Additional Equipment:   Intra-op Plan:   Post-operative Plan:   Informed Consent: I have reviewed the patients History and Physical, chart, labs and discussed the procedure including the risks, benefits and alternatives for the proposed anesthesia with the patient or authorized representative who has indicated his/her understanding and acceptance.     Dental Advisory Given  Plan Discussed with: Anesthesiologist, CRNA and Surgeon  Anesthesia Plan Comments:         Anesthesia Quick Evaluation

## 2023-02-28 NOTE — Anesthesia Postprocedure Evaluation (Signed)
Anesthesia Post Note  Patient: LETOSHA TREML  Procedure(s) Performed: CLOSED REDUCTION HIP (Right: Hip)  Patient location during evaluation: PACU Anesthesia Type: General Level of consciousness: awake and alert Pain management: pain level controlled Vital Signs Assessment: post-procedure vital signs reviewed and stable Respiratory status: spontaneous breathing, nonlabored ventilation, respiratory function stable and patient connected to nasal cannula oxygen Cardiovascular status: blood pressure returned to baseline and stable Postop Assessment: no apparent nausea or vomiting Anesthetic complications: no   No notable events documented.   Last Vitals:  Vitals:   02/28/23 0141 02/28/23 0757  BP: (!) 160/52 133/72  Pulse:  72  Resp: 20 16  Temp: 36.7 C 36.7 C  SpO2: 98% 98%    Last Pain:  Vitals:   02/27/23 2000  TempSrc:   PainSc: 0-No pain                 Yevette Edwards

## 2023-02-28 NOTE — Plan of Care (Signed)

## 2023-02-28 NOTE — Progress Notes (Signed)
OT Cancellation Note  Patient Details Name: NOLEEN BULICK MRN: [id number] DOB: 08-21-1933   Cancelled Treatment:    Reason Eval/Treat Not Completed: Patient at procedure or test/ unavailable. Pt out of the room, currently in surgery. Will re-evaluate as medically appropriate.   Arman Filter., MPH, MS, OTR/L ascom [phone] 02/28/23, 3:57 PM

## 2023-02-28 NOTE — Interval H&P Note (Signed)
Patient history and physical updated. Consent reviewed including risks, benefits, and alternatives to surgery. Patient and daughter agrees wit above plan to proceed with right hip revision with conversion to constrained implant.

## 2023-02-28 NOTE — Transfer of Care (Signed)
Immediate Anesthesia Transfer of Care Note  Patient: Marilyn Romero  Procedure(s) Performed: TOTAL HIP ARTHROPLASTY REVISION WITH CONVERSION TO CONSTRAINED LINER (Right: Hip)  Patient Location: PACU  Anesthesia Type:Spinal  Level of Consciousness: drowsy  Airway & Oxygen Therapy: Patient Spontanous Breathing and Patient connected to face mask oxygen  Post-op Assessment: Report given to RN and Post -op Vital signs reviewed and stable  Post vital signs: Reviewed and stable  Last Vitals:  Vitals Value Taken Time  BP 122/58 02/28/23 1615  Temp 35.8 1615  Pulse 63 02/28/23 1619  Resp 17 02/28/23 1619  SpO2 100 % 02/28/23 1619  Vitals shown include unfiled device data.  Last Pain:  Vitals:   02/28/23 1206  TempSrc: Tympanic  PainSc: 0-No pain         Complications: No notable events documented.

## 2023-02-28 NOTE — Op Note (Addendum)
Patient Name: Marilyn Romero  ZOX:[id number]  Pre-Operative Diagnosis: Right hip instability with recurrent dislocations status post total hip arthroplasty  Post-Operative Diagnosis: (same)  Procedure: Right hip revision with head and liner exchanged for constrained liner with cemented polyethylene  Components/Implants:  Liner: G7 Freedom constrained neutral Size C 32mm ID liner cemented into existing acetabular component   Head: Constrained 12/14 taper 32mm +0 freedom head  Date of Surgery: 02/28/2023  Surgeon: Reinaldo Berber MD  Assistant: Amador Cunas pA (present and scrubbed throughout the case, critical for assistance with exposure, retraction, instrumentation, and closure)   Anesthesiologist: Karlton Lemon  Anesthesia: General   IVF:600cc  EBL: 200cc  Complications: None   Brief history: The patient is an 89 year old female with a history of right hip replacement 18 years ago who has sustained recurrent dislocations which have recently begun happening with minimal stress to the hip and without falls up to every 2 weeks.  Patient was treated conservatively with hip abduction braces and was closed reduced multiple times but continues to dislocate due to a complete loss of her abductor musculature and lateral proximal femur bone.   The risks and benefits of revision total hip arthroplasty as definitive surgical treatment were discussed with the patient, who opted to proceed with the operation.  After outpatient medical clearance and optimization was completed the patient was admitted to [facility] for the procedure.  All preoperative films were reviewed and an appropriate surgical plan was made prior to surgery.   Description of procedure: The patient was brought to the operating room where laterality was confirmed by all those present to be the right  side.  The patient was moved to the table and administered general anesthesia. Patient was given an intravenous dose of  antibiotics for surgical prophylaxis and TXA . The patient was positioned in lateral decubitus position with all bony prominences well-padded.  Surgical site was prepped with alcohol and chlorhexidine.  Surgical site over the hip was draped in typical sterile fashion with multiple layers of adhesive and nonadhesive drapes.  The incision site over the greater trochanter posteriorly was marked out with a sterile marker.   Surgical timeout was then called with participation of all staff in the room the patient was confirmed and laterality again confirmed.    At the distal aspect of the incision there was noted to be a retained nylon suture likely from previous surgery which was removed with a hemostat prior to incision. The previous incision from the prior surgery was marked and an incision was made in line with the prior incision over the lateral aspect of the hip moving posteriorly at the proximal aspect.  Careful soft tissue dissection and coagulation of all bleeders was carried out down to the level of the glut max fascia.  Any encountered sutures were carefully and meticulously removed from the wound bed and skin.  The fascia was carefully incised in line with the femur, again with care taken to remove any prior sutures encountered in the fascia.  Upon entering the gluteus maximus fascia we encountered scar tissue and the lateral metal of the femoral component there was no greater trochanter bone at all.  A self-retaining retractor was placed deep to the fascia with care taken to ensure there is no entrapment of any nerve in the retractor.  Scar tissue was excised down to the neck and proximal component of the femoral stem.  At this point we encountered the edge of the previous acetabular component which showed that  there was a polyethylene which had been cemented into the previous cup and had severe erosion at the superior lateral aspect.  A complete synovectomy/scar removal around the proximal femur and  the acetabular component.  The hip was then carefully dislocated.  A tamp was used to attempt to dissociate the femoral head from the femoral component.  The femoral component was then tested for stability and found to be stable within the femur with no motion at the bone interface. After carefully removing all the excess soft tissue around the proximal femoral component our attention was then turned to the acetabulum.  A small anterior recess was created to allow anterior retraction of the femur to access the entire acetabulum.  All of the anterior soft tissue on the rim of the acetabular component was removed fully exposing the acetabular component 360 degrees which showed that there had been a previous polyethylene cemented into an acetabular component to achieve additional anteversion.  Quarter inch curved osteotome was used to carefully chip away the cement from around the polyethylene and the polyethylene came free from the cement mantle after working around 360 degrees.  All cement fragments were carefully removed and the cup was irrigated.  There was a significant amount of cement left within the cup which was well-fixed.  A bur was used to roughen up the cement within the cup in order to except a cement and cement technique.  Trial constrained liners were then used to approximate the appropriate size and a size C constrained polyethylene liner was chosen.  A single bag of cement was mixed and hand packed into the existing cup.  The constrained liner was then roughed up with a burr to achieve better adherence to the cement.  The cup was put in 15 to 20 degrees of anteversion compared to the existing acetabular component with a decreased inclination angle and allowing significant offset from its existing position.  The cup was held in place while the cement cured and any excess cement was carefully removed.  A real constrained head was then applied to the femoral component and tapped into place with good  secure fit.  The hip was carefully reduced and the mechanism was engaged on the constrained liner. The hip was reduced and taken through range of motion and found to be stable with slightly increased leg lengths.  The hip was then irrigated with Betadine based surgiphor wash followed by another few liters of normal saline.   The pericapsular and subcutaneous tissues were injected with an Exparel based cocktail.  The fascia was then approximated with #1 Vicryl and #2 barbed suture.  The subcutaneous tissues and skin were closed with 0 Vicryl 2-0 Vicryl and 3-0 V lock suture and the skin closed with Dermabond.  A sterile dressing was then applied.  Lap, sharps, and sponge counts were correct at the end of the case.    The patient was then rolled supine and an x-ray was taken in the operating room with the operating room table kept Sterile. Leg lengths were clinically similar on examination with a good distal pulse. components appeared in good position with no fractures noted on x-ray.  Patient was then transferred to a hospital bed and transferred to the recovery room in stable condition.

## 2023-02-28 NOTE — Anesthesia Postprocedure Evaluation (Signed)
Anesthesia Post Note  Patient: Marilyn Romero  Procedure(s) Performed: TOTAL HIP ARTHROPLASTY REVISION WITH CONVERSION TO CONSTRAINED LINER (Right: Hip)  Patient location during evaluation: PACU Anesthesia Type: Spinal Level of consciousness: awake and alert Pain management: pain level controlled Vital Signs Assessment: post-procedure vital signs reviewed and stable Respiratory status: spontaneous breathing, nonlabored ventilation, respiratory function stable and patient connected to nasal cannula oxygen Cardiovascular status: blood pressure returned to baseline and stable Postop Assessment: no apparent nausea or vomiting Anesthetic complications: no  No notable events documented.   Last Vitals:  Vitals:   02/28/23 1645 02/28/23 1703  BP: 123/66 (!) 145/62  Pulse: 66 64  Resp: 19 16  Temp: 36.9 C   SpO2: 100% 97%    Last Pain:  Vitals:   02/28/23 1805  TempSrc:   PainSc: 0-No pain                 Stephanie Coup

## 2023-02-28 NOTE — Anesthesia Procedure Notes (Signed)
Spinal  Patient location during procedure: OR Start time: 02/28/2023 1:21 PM End time: 02/28/2023 1:30 PM Reason for block: surgical anesthesia Staffing Performed: resident/CRNA  Resident/CRNA: Morene Crocker, CRNA Performed by: Morene Crocker, CRNA Authorized by: Lenard Simmer, MD   Preanesthetic Checklist Completed: patient identified, IV checked, site marked, risks and benefits discussed, surgical consent, monitors and equipment checked, pre-op evaluation and timeout performed Spinal Block Patient position: sitting Prep: ChloraPrep Patient monitoring: heart rate, continuous pulse ox and blood pressure Approach: midline Location: L4-5 Injection technique: single-shot Needle Needle type: Introducer and Pencan  Needle gauge: 24 G Needle length: 9 cm Assessment Sensory level: T10 Events: CSF return Additional Notes Negative paresthesia. Negative blood return. Positive free-flowing CSF. Expiration date of kit checked and confirmed. Patient tolerated procedure well, without complications. Successful on first attempt. No complications noted. Pt. Tolerated procedure well without any complaints of pain/discomfort.

## 2023-03-01 ENCOUNTER — Inpatient Hospital Stay: Payer: 59

## 2023-03-01 DIAGNOSIS — D72829 Elevated white blood cell count, unspecified: Secondary | ICD-10-CM

## 2023-03-01 DIAGNOSIS — I10 Essential (primary) hypertension: Secondary | ICD-10-CM | POA: Diagnosis not present

## 2023-03-01 DIAGNOSIS — M5136 Other intervertebral disc degeneration, lumbar region: Secondary | ICD-10-CM | POA: Diagnosis not present

## 2023-03-01 DIAGNOSIS — M24451 Recurrent dislocation, right hip: Secondary | ICD-10-CM | POA: Diagnosis not present

## 2023-03-01 DIAGNOSIS — R410 Disorientation, unspecified: Secondary | ICD-10-CM

## 2023-03-01 MED ORDER — ACETAMINOPHEN 325 MG PO TABS
325.0000 mg | ORAL_TABLET | Freq: Four times a day (QID) | ORAL | Status: DC | PRN
  Administered 2023-03-02 – 2023-03-04 (×5): 650 mg via ORAL
  Filled 2023-03-01 (×7): qty 2

## 2023-03-01 MED ORDER — HALOPERIDOL LACTATE 5 MG/ML IJ SOLN
1.0000 mg | Freq: Four times a day (QID) | INTRAMUSCULAR | Status: DC | PRN
  Filled 2023-03-01: qty 1

## 2023-03-01 MED ORDER — HYDROCODONE-ACETAMINOPHEN 5-325 MG PO TABS
1.0000 | ORAL_TABLET | Freq: Four times a day (QID) | ORAL | Status: DC | PRN
  Administered 2023-03-03: 1 via ORAL
  Filled 2023-03-01 (×2): qty 1

## 2023-03-01 MED ORDER — QUETIAPINE FUMARATE 25 MG PO TABS
12.5000 mg | ORAL_TABLET | Freq: Every day | ORAL | Status: DC
  Administered 2023-03-01: 12.5 mg via ORAL
  Filled 2023-03-01: qty 1

## 2023-03-01 NOTE — Evaluation (Signed)
Occupational Therapy Evaluation Patient Details Name: Marilyn Romero MRN: [id number] DOB: 10-19-1933 Today's Date: 03/01/2023   History of Present Illness Pt is an 89 y/o F who presents with R hip dislocation (multiple dislocations/reductions since original surgery). PMH of HTN, OA, HLD, falls, GERD, colon CA. R hip THA 02/28/23.   Clinical Impression   Pt demonstrates good mobility and denies pain POD #1 from R THA, although she is highly confused, not oriented to place, time, or situation. Pt requires multimodal cueing for safety, safe use of RW. Provided educ re: hip precautions; will need additional training on this, as pt demonstrates limited understanding. Recommend ongoing OT during acute stay, with facility-level rehab post DC.    Recommendations for follow up therapy are one component of a multi-disciplinary discharge planning process, led by the attending physician.  Recommendations may be updated based on patient status, additional functional criteria and insurance authorization.   Assistance Recommended at Discharge Frequent or constant Supervision/Assistance  Patient can return home with the following A little help with walking and/or transfers;A lot of help with bathing/dressing/bathroom;Assistance with cooking/housework;Direct supervision/assist for medications management;Direct supervision/assist for financial management;Assist for transportation;Help with stairs or ramp for entrance    Functional Status Assessment  Patient has had a recent decline in their functional status and demonstrates the ability to make significant improvements in function in a reasonable and predictable amount of time.  Equipment Recommendations  None recommended by OT    Recommendations for Other Services       Precautions / Restrictions Precautions Precautions: Posterior Hip Precaution Comments: per MD, posterior hip precaution Restrictions Weight Bearing Restrictions: No      Mobility Bed  Mobility Overal bed mobility: Needs Assistance Bed Mobility: Supine to Sit, Sit to Supine     Supine to sit: Min guard Sit to supine: Min guard   General bed mobility comments: extra time, effort, cueing for safety    Transfers Overall transfer level: Needs assistance Equipment used: Rolling walker (2 wheels) Transfers: Sit to/from Stand Sit to Stand: Min assist           General transfer comment: Multimodal cueing for safe use of DME, safety      Balance Overall balance assessment: Mild deficits observed, not formally tested                                         ADL either performed or assessed with clinical judgement   ADL Overall ADL's : Needs assistance/impaired Eating/Feeding: Minimal assistance Eating/Feeding Details (indicate cue type and reason): Min A for opening containers and packaging, cutting food                 Lower Body Dressing: Maximal assistance Lower Body Dressing Details (indicate cue type and reason): following hip precautions                     Vision         Perception     Praxis      Pertinent Vitals/Pain Pain Assessment Pain Assessment: No/denies pain Pain Location: Pt denies pain, has no awareness that she has had surgery     Hand Dominance     Extremity/Trunk Assessment Upper Extremity Assessment Upper Extremity Assessment: Generalized weakness RUE Deficits / Details: Pt with hx of RUE non-surgical fracture per RN (pt unable to accurately describe) and therefore R hand is minimally  functional; Pt able to make weak grip; unable to flex R shoulder; has to place R hand on RW with L hand.   Lower Extremity Assessment Lower Extremity Assessment: Generalized weakness;RLE deficits/detail RLE Deficits / Details: s/p R THA       Communication Communication Communication: HOH   Cognition Arousal/Alertness: Awake/alert Behavior During Therapy: WFL for tasks assessed/performed Overall Cognitive  Status: Impaired/Different from baseline                           Safety/Judgement: Decreased awareness of deficits   Problem Solving: Slow processing, Requires verbal cues, Requires tactile cues, Difficulty sequencing General Comments: Pt very confused today     General Comments       Exercises Other Exercises Other Exercises: cognitive reorientation; educ re: hip precautions, but will need repetition, as pt demonstrates poor retention   Shoulder Instructions      Home Living Family/patient expects to be discharged to:: Skilled nursing facility                                 Additional Comments: Pt unable to provide info re: her prior living situation. Per chart review, she comes to [facility] from a SNF      Prior Functioning/Environment Prior Level of Function : Patient poor historian/Family not available             Mobility Comments: Pt unable to state ADLs Comments: Per chart review, pt living at SNF, receiving assistance with most BADL and all IADL,        OT Problem List: Decreased range of motion;Impaired balance (sitting and/or standing);Decreased activity tolerance;Decreased safety awareness;Impaired UE functional use      OT Treatment/Interventions: Self-care/ADL training;Therapeutic exercise;DME and/or AE instruction;Patient/family education;Therapeutic activities    OT Goals(Current goals can be found in the care plan section) Acute Rehab OT Goals Patient Stated Goal: feel better OT Goal Formulation: With patient Time For Goal Achievement: 03/15/23 Potential to Achieve Goals: Good  OT Frequency: Min 1X/week    Co-evaluation              AM-PAC OT "6 Clicks" Daily Activity     Outcome Measure Help from another person eating meals?: A Little Help from another person taking care of personal grooming?: A Little Help from another person toileting, which includes using toliet, bedpan, or urinal?: A Little Help from another  person bathing (including washing, rinsing, drying)?: A Lot Help from another person to put on and taking off regular upper body clothing?: A Little Help from another person to put on and taking off regular lower body clothing?: A Lot 6 Click Score: 16   End of Session Equipment Utilized During Treatment: Rolling walker (2 wheels)  Activity Tolerance: Patient tolerated treatment well Patient left: in bed;with call bell/phone within reach  OT Visit Diagnosis: Unsteadiness on feet (R26.81);Muscle weakness (generalized) (M62.81)                Time: 1315-1330 OT Time Calculation (min): 15 min Charges:  OT General Charges $OT Visit: 1 Visit OT Evaluation $OT Re-eval: 1 Re-eval Latina Craver, PhD, MS, OTR/L 03/01/23, 4:52 PM

## 2023-03-01 NOTE — Progress Notes (Addendum)
   Subjective: 1 Day Post-Op Procedure(s) (LRB): TOTAL HIP ARTHROPLASTY REVISION WITH CONVERSION TO CONSTRAINED LINER (Right) Patient reports pain as mild.   Patient is well, and has had no acute complaints or problems Denies any CP, SOB, ABD pain. We will continue therapy today.  Plan is to go Skilled nursing facility after hospital stay.  Objective: Vital signs in last 24 hours: Temp:  [97.3 F (36.3 C)-98.4 F (36.9 C)] 98.2 F (36.8 C) (08/02 0457) Pulse Rate:  [64-81] 80 (08/02 0457) Resp:  [14-20] 18 (08/02 0457) BP: (115-145)/(58-72) 120/68 (08/02 0457) SpO2:  [97 %-100 %] 98 % (08/02 0457) Weight:  [49.9 kg] 49.9 kg (08/01 1206)  Intake/Output from previous day: 08/01 0701 - 08/02 0700 In: 850 [I.V.:600; IV Piggyback:250] Out: 750 [Urine:550; Blood:200] Intake/Output this shift: No intake/output data recorded.  Recent Labs    03/01/23 0521  HGB 13.0   Recent Labs    03/01/23 0521  WBC 17.3*  RBC 4.57  HCT 40.2  PLT 268   Recent Labs    03/01/23 0521  NA 140  K 4.6  CL 106  CO2 22  BUN 15  CREATININE 0.71  GLUCOSE 120*  CALCIUM 9.4   No results for input(s): "LABPT", "INR" in the last 72 hours.  EXAM General - Patient is Alert and Appropriate.  Slight confusion Extremity - Neurovascular intact Sensation intact distally Intact pulses distally Dorsiflexion/Plantar flexion intact No cellulitis present Compartment soft Abduction pillow intact Dressing - dressing C/D/I and no drainage Motor Function - intact, moving foot and toes well on exam.   Past Medical History:  Diagnosis Date   Cancer of sigmoid colon (HCC) 12/08/2014   Colonic mass 10/07/2012   Overview:  Gets routine colonoscopies. Dx was 2013.   DDD (degenerative disc disease), lumbar 12/16/2014   Diverticulitis 06/27/2011   Facet syndrome, lumbar 12/16/2014   GERD (gastroesophageal reflux disease) 05/26/2012   Insomnia 06/18/2016   Last Assessment & Plan:  Relevant Hx: Course:  Daily Update: Today's Plan:   Lesion of bladder 04/29/2012   Lumbar radiculopathy 10/04/2015   Sacroiliac joint dysfunction of both sides 12/16/2014   Status post total replacement of right hip 08/15/2015    Assessment/Plan:   1 Day Post-Op Procedure(s) (LRB): TOTAL HIP ARTHROPLASTY REVISION WITH CONVERSION TO CONSTRAINED LINER (Right) Principal Problem:   Recurrent dislocation of right hip Active Problems:   DDD (degenerative disc disease), lumbar   Essential hypertension  Estimated body mass index is 18.3 kg/m as calculated from the following:   Height as of this encounter: 5\' 5"  (1.651 m).   Weight as of this encounter: 49.9 kg. Advance diet Up with therapy, weightbearing as tolerated.  Posterior hip precautions Vital signs are stable Labs are stable Pain well-controlled Care management to assist with discharge to skilled nursing facility   Follow-up with [facility] orthopedics in 2 weeks Lovenox 40 mg subcu daily x 14 days at discharge.  DVT Prophylaxis - Lovenox, TED hose, and SCDs Weight-Bearing as tolerated to right leg   T. Cranston Neighbor, PA-C [facility] 03/01/2023, 7:52 AM   Patient seen and examined, agree with above plan.  The patient is doing well status post right hip revision to constrained liner, no concerns at this time.  Pain is controlled. Patient having some delirium on exam this morning, pulling at IV's and abduction brace. Discussed with nursing who were applying some mitts.   Reinaldo Berber MD

## 2023-03-01 NOTE — Assessment & Plan Note (Signed)
Likely reactive after surgery.

## 2023-03-01 NOTE — Progress Notes (Signed)
  Progress Note   Patient: Marilyn Romero QQV:[id number] DOB: 1933/12/11 DOA: 02/26/2023     3 DOS: the patient was seen and examined on 03/01/2023   Brief hospital course: 89 y.o. Caucasian female with medical history significant for GERD, diverticulitis, lumbar radiculopathy, and essential hypertension, who presented to the emergency room l with right hip nontraumatic dislocation.  This is the fifth time patient had her prosthetic right hip joint dislocation. Closed reduction in the OR is performed on 7/30.  Revision of the right hip done 8/1.  8/2.  Patient is delirious this morning after surgery.  Did not sleep very well last night.  Patient not complaining of pain.  Assessment and Plan: * Acute delirium Likely secondary to being in an unfamiliar environments, anesthesia and potentially pain medication.  Will give Seroquel at night so she sleeps tonight.  As needed Haldol.  Discontinue IV pain medications and try to use Tylenol first for pain.  Recurrent dislocation of right hip Patient and close posterior right prosthetic hip reduction on 7/30.  Hip revision surgery on 8/1.   Essential hypertension On lisinopril  DDD (degenerative disc disease), lumbar Continue working with physical therapy.  Leukocytosis Could be reactive with surgery.  Check a urinalysis if able and also chest x-ray.        Subjective: Patient states that she is not also alcohol more and this will be her last flight.  Patient did not sleep very well.  Had hip revision surgery yesterday.  No complaints of pain.  Walked with physical therapy.  Physical Exam: Vitals:   02/28/23 2040 02/28/23 2357 03/01/23 0457 03/01/23 0910  BP: 119/67 115/66 120/68 (!) 152/72  Pulse: 81 78 80 76  Resp: 17 18 18 18   Temp: 98.4 F (36.9 C) 98.3 F (36.8 C) 98.2 F (36.8 C)   TempSrc:   Oral   SpO2: 97% 98% 98% 100%  Weight:      Height:       Physical Exam HENT:     Head: Normocephalic.     Mouth/Throat:      Pharynx: No oropharyngeal exudate.  Eyes:     General: Lids are normal.     Conjunctiva/sclera: Conjunctivae normal.  Cardiovascular:     Rate and Rhythm: Normal rate and regular rhythm.     Heart sounds: S1 normal and S2 normal. Murmur heard.     Systolic murmur is present with a grade of 2/6.  Pulmonary:     Breath sounds: No decreased breath sounds, wheezing, rhonchi or rales.  Abdominal:     Palpations: Abdomen is soft.     Tenderness: There is no abdominal tenderness.  Musculoskeletal:     Right lower leg: No swelling.     Left lower leg: No swelling.  Skin:    General: Skin is warm.     Findings: No rash.  Neurological:     Mental Status: She is disoriented.     Data Reviewed: White blood cell count 17.3, hemoglobin 13, creatinine 0.71  Family Communication: Updated patient's daughter on the phone  Disposition: Status is: Inpatient Remains inpatient appropriate because: Postoperative day 1 for hip revision surgery.  Patient with acute delirium today.  Planned Discharge Destination: Patient from assisted living    Time spent: 28 minutes  Author: Alford Highland, MD 03/01/2023 1:12 PM  For on call review [email or web address].

## 2023-03-01 NOTE — Assessment & Plan Note (Signed)
Likely secondary to being in an unfamiliar environments, anesthesia and potentially pain medication.  Will give Seroquel standing dose tonight instead of as needed.  Patient asking more questions today about what happened during the hospital course.

## 2023-03-01 NOTE — NC FL2 (Signed)
[place] MEDICAID FL2 LEVEL OF CARE FORM     IDENTIFICATION  Patient Name: Marilyn Romero Birthdate: Oct 05, 1933 Sex: female Admission Date (Current Location): 02/26/2023  [facility] and [place] Number:  Chiropodist and Address:  [facility], [address] [place], [place] [address]      Provider Number: [id number]  Attending Physician Name and Address:  Alford Highland, MD  Relative Name and Phone Number:  Marquette Saa [phone]    Current Level of Care: Hospital Recommended Level of Care: Skilled Nursing Facility Prior Approval Number:    Date Approved/Denied:   PASRR Number: [id number]  Discharge Plan: SNF    Current Diagnoses: Patient Active Problem List   Diagnosis Date Noted   Acute delirium 03/01/2023   Recurrent dislocation of right hip 02/26/2023   Dislocation of hip joint prosthesis, subsequent encounter 02/02/2023   Hypokalemia 01/25/2023   Acute anemia 01/25/2023   Hip dislocation, right (HCC) 01/23/2023   Right sided weakness 08/29/2022   Mixed hyperlipidemia 08/29/2022   Baker's cyst of knee, left 12/10/2021   SBO (small bowel obstruction) (HCC) 12/08/2021   UTI (urinary tract infection) 12/08/2021   Essential hypertension 12/08/2021   Dysphagia 12/08/2021   Chronic pain syndrome 01/10/2021   History of hysterectomy 06/23/2019   Leukocytosis 06/23/2019   Low vitamin B12 level 02/12/2018   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) 01/08/2018   Sacroiliac joint pain 01/08/2018   Right rotator cuff tear arthropathy 05/07/2017   Impingement syndrome of right shoulder 09/14/2016   Personal history of colon cancer    Abdominal pain, epigastric    Acute peptic ulcer of stomach    Gastritis without bleeding    Insomnia 06/18/2016   Musculoskeletal pain 10/04/2015   Lumbar radiculopathy 10/04/2015   Status post total replacement of right hip 08/15/2015   Primary osteoarthritis of one  hip 08/15/2015   Pain due to total hip replacement (HCC) 08/15/2015   DDD (degenerative disc disease), lumbar 12/16/2014   Facet syndrome, lumbar 12/16/2014   Sacroiliac joint dysfunction of both sides 12/16/2014   Cancer of sigmoid colon (HCC) 12/08/2014   Pain medication agreement signed 08/12/2013   Pure hypercholesterolemia 08/10/2013   Colonic mass 10/07/2012   GERD (gastroesophageal reflux disease) 05/26/2012   Lesion of bladder 04/29/2012   Atrophic vaginitis 04/29/2012   Dysuria 04/17/2012   Microscopic hematuria 04/17/2012   Diverticulitis 06/27/2011    Orientation RESPIRATION BLADDER Height & Weight     Self, Situation  Normal Continent, External catheter Weight: 49.9 kg Height:  5\' 5"  (165.1 cm)  BEHAVIORAL SYMPTOMS/MOOD NEUROLOGICAL BOWEL NUTRITION STATUS      Continent Diet (See DC summary)  AMBULATORY STATUS COMMUNICATION OF NEEDS Skin   Extensive Assist Verbally Normal, Surgical wounds                       Personal Care Assistance Level of Assistance  Bathing, Feeding, Dressing Bathing Assistance: Maximum assistance Feeding assistance: Limited assistance Dressing Assistance: Maximum assistance     Functional Limitations Info  Sight, Hearing, Speech Sight Info: Adequate Hearing Info: Adequate Speech Info: Adequate    SPECIAL CARE FACTORS FREQUENCY  PT (By licensed PT), OT (By licensed OT)     PT Frequency: 5 times per week OT Frequency: 5 times per week            Contractures Contractures Info: Not present    Additional Factors Info  Code Status, Allergies Code  Status Info: DNR Allergies Info: NKDA           Current Medications (03/01/2023):  This is the current hospital active medication list Current Facility-Administered Medications  Medication Dose Route Frequency Provider Last Rate Last Admin   acetaminophen (TYLENOL) tablet 325-650 mg  325-650 mg Oral Q6H PRN Wieting, Richard, MD       bisacodyl (DULCOLAX) suppository 10 mg   10 mg Rectal Daily PRN Poggi, Excell Seltzer, MD       docusate sodium (COLACE) capsule 100 mg  100 mg Oral BID Reinaldo Berber, MD   100 mg at 03/01/23 0938   enoxaparin (LOVENOX) injection 40 mg  40 mg Subcutaneous Q24H Reinaldo Berber, MD   40 mg at 03/01/23 3151   ferrous sulfate tablet 325 mg  325 mg Oral Q breakfast Poggi, Excell Seltzer, MD   325 mg at 03/01/23 7616   guaiFENesin (ROBITUSSIN) 100 MG/5ML liquid 15 mL  15 mL Oral Q6H PRN Poggi, Excell Seltzer, MD       haloperidol lactate (HALDOL) injection 1 mg  1 mg Intravenous Q6H PRN Wieting, Richard, MD       HYDROcodone-acetaminophen (NORCO/VICODIN) 5-325 MG per tablet 1-2 tablet  1-2 tablet Oral Q6H PRN Wieting, Richard, MD       lisinopril (ZESTRIL) tablet 10 mg  10 mg Oral Daily Poggi, Excell Seltzer, MD   10 mg at 03/01/23 0737   magnesium hydroxide (MILK OF MAGNESIA) suspension 30 mL  30 mL Oral Daily PRN Poggi, Excell Seltzer, MD       menthol-cetylpyridinium (CEPACOL) lozenge 3 mg  1 lozenge Oral PRN Reinaldo Berber, MD       Or   phenol (CHLORASEPTIC) mouth spray 1 spray  1 spray Mouth/Throat PRN Reinaldo Berber, MD       mirtazapine (REMERON) tablet 7.5 mg  7.5 mg Oral QHS Poggi, Excell Seltzer, MD   7.5 mg at 02/28/23 2104   multivitamin with minerals tablet 1 tablet  1 tablet Oral Daily Poggi, Excell Seltzer, MD   1 tablet at 03/01/23 0938   ondansetron (ZOFRAN) tablet 4 mg  4 mg Oral Q6H PRN Reinaldo Berber, MD       Or   ondansetron (ZOFRAN) injection 4 mg  4 mg Intravenous Q6H PRN Reinaldo Berber, MD       QUEtiapine (SEROQUEL) tablet 12.5 mg  12.5 mg Oral QHS Wieting, Richard, MD       sodium phosphate (FLEET) 7-19 GM/118ML enema 1 enema  1 enema Rectal Once PRN Poggi, Excell Seltzer, MD       traZODone (DESYREL) tablet 25 mg  25 mg Oral QHS PRN Poggi, Excell Seltzer, MD         Discharge Medications: Please see discharge summary for a list of discharge medications.  Relevant Imaging Results:  Relevant Lab Results:   Additional Information SS #: [id number]  Asier Desroches Seward Carol, RN

## 2023-03-01 NOTE — TOC Progression Note (Signed)
Transition of Care [facility]) - Progression Note    Patient Details  Name: Marilyn Romero MRN: [id number] Date of Birth: [birth year]  Transition of [facility] CM/SW Contact  Marlowe Sax, RN Phone Number: 03/01/2023, 2:54 PM  Clinical Narrative:     Spoke with the patient's daughter [name], She stated that the patient is LTC at [facility] and before that was in their STR, She has used her free bed days.  She used to be a resident at the [facility],  The daughter would like her to go back to the [facility], I explained that [facility] would have been paid for the month of August and therefore no other facility will be able to be paid by [facility] until Sept 1st. She stated understanding and is agreeable for the patient to return to [facility] She will work with [facility] and [facility] to get the patient back to ALF if appropriate  Expected Discharge Plan: Skilled Nursing Facility Barriers to Discharge: Continued Medical Work up  Expected Discharge Plan and Services                           Fl2 complete, PASSR Obntained                     Social Determinants of Health (SDOH) Interventions SDOH Screenings   Food Insecurity: Patient Declined (02/26/2023)  Housing: Patient Declined (02/26/2023)  Recent Concern: Housing - High Risk (01/24/2023)  Transportation Needs: Patient Declined (02/26/2023)  Utilities: Patient Declined (02/26/2023)  Depression (PHQ2-9): Low Risk  (06/27/2022)  Tobacco Use: Low Risk  (02/28/2023)    Readmission Risk Interventions    02/26/2023    8:39 AM  Readmission Risk Prevention Plan  Transportation Screening Complete  PCP or Specialist Appt within 3-5 Days Complete  HRI or Home Care Consult Complete  Social Work Consult for Recovery Care Planning/Counseling Complete  Palliative Care Screening Not Applicable  Medication Review Oceanographer) Referral to Pharmacy

## 2023-03-01 NOTE — Progress Notes (Signed)
1400 Urine sample sent down to lab

## 2023-03-01 NOTE — Evaluation (Signed)
Physical Therapy Evaluation Patient Details Name: CLYDETTE PRIVITERA MRN: [id number] DOB: Dec 22, 1933 Today's Date: 03/01/2023  History of Present Illness  Pt is an 89 y/o F who presents with R hip dislocation (multiple dislocations/reductions since original surgery). PMH of HTN, OA, HLD, falls, GERD, colon CA. R hip THA 02/28/23.   Clinical Impression  Pt is received in bed, she is agreeable to PT session but severely confused. Pt AO x4 impaired with reports that she is "at airport" and "on last flight" throughout session. Pt able to perform bed mobility independently with cues for safety/hip precautions following THA sx. Pt able to amb ~150' with RW min guard for safety and demonstrated increased stance time on RLE. Pt reported min R hip pain towards end of session but reports "not too bad". Pt will cont to benefit from skilled PT to address functional mobility and stair negotiation.      If plan is discharge home, recommend the following: A little help with walking and/or transfers;A little help with bathing/dressing/bathroom;Help with stairs or ramp for entrance;Assist for transportation   Can travel by private vehicle   No    Equipment Recommendations Rolling walker (2 wheels)  Recommendations for Other Services       Functional Status Assessment Patient has had a recent decline in their functional status and demonstrates the ability to make significant improvements in function in a reasonable and predictable amount of time.     Precautions / Restrictions Precautions Precautions: Posterior Hip Precaution Booklet Issued: No Precaution Comments: per MD, posterior hip precaution Restrictions Weight Bearing Restrictions: No      Mobility  Bed Mobility Overal bed mobility: Modified Independent Bed Mobility: Supine to Sit     Supine to sit: Min guard     General bed mobility comments: with extra time and cues for safety    Transfers Overall transfer level: Needs  assistance Equipment used: Rolling walker (2 wheels) Transfers: Sit to/from Stand Sit to Stand: Min guard, Min assist           General transfer comment: Pt needing cues for hand placement    Ambulation/Gait Ambulation/Gait assistance: Modified independent (Device/Increase time) Gait Distance (Feet): 150 Feet Assistive device: Rolling walker (2 wheels) Gait Pattern/deviations: Decreased step length - right, Decreased stride length, Step-to pattern, Decreased stance time - right, Decreased step length - left Gait velocity: WNL     General Gait Details: Pt able to amb with RW and min guard with noted decreased stance time on RLE but no reports of pain/discomfort.  Stairs            Wheelchair Mobility     Tilt Bed    Modified Rankin (Stroke Patients Only)       Balance Overall balance assessment: Mild deficits observed, not formally tested                                           Pertinent Vitals/Pain Pain Assessment Pain Assessment: Faces Faces Pain Scale: Hurts a little bit Pain Location: R hip Pain Descriptors / Indicators: Aching Pain Intervention(s): Monitored during session, Repositioned    Home Living Family/patient expects to be discharged to:: Skilled nursing facility                   Additional Comments: Pt reports that she lives in an apartment, uses RW "recently". However, case management note (from  discussion with daughter) states pt was at SNF prior to admission.    Prior Function Prior Level of Function : Patient poor historian/Family not available             Mobility Comments: Pt reports using RW "recently". Need to clarify with family.       Hand Dominance        Extremity/Trunk Assessment   Upper Extremity Assessment Upper Extremity Assessment: Generalized weakness RUE Deficits / Details: Pt with hx of RUE non-surgical fracture per RN (pt unable to accurately describe) and therefore R hand is  minimally functional; Pt able to make weak grip; unable to flex R shoulder; has to place R hand on RW with L hand.    Lower Extremity Assessment Lower Extremity Assessment: Generalized weakness       Communication   Communication: HOH  Cognition Arousal/Alertness: Awake/alert Behavior During Therapy: Restless Overall Cognitive Status: Impaired/Different from baseline Area of Impairment: Safety/judgement, Following commands, Awareness, Problem solving                       Following Commands: Follows multi-step commands inconsistently Safety/Judgement: Decreased awareness of safety   Problem Solving: Slow processing, Requires verbal cues General Comments: Pt is alert, but not oriented to place, time reporting she is "at airport" and "on last flight" throughout session. Pt cog is worse than prior visit requiring re-orientation to hospital.        General Comments General comments (skin integrity, edema, etc.): Pt requires min A for standing balance for safety    Exercises     Assessment/Plan    PT Assessment Patient needs continued PT services  PT Problem List Decreased strength;Decreased activity tolerance;Decreased mobility;Decreased cognition;Decreased balance;Decreased safety awareness;Decreased knowledge of precautions;Pain       PT Treatment Interventions Stair training;Gait training;Functional mobility training;Cognitive remediation;DME instruction;Therapeutic exercise;Therapeutic activities;Patient/family education;Balance training    PT Goals (Current goals can be found in the Care Plan section)  Acute Rehab PT Goals Patient Stated Goal: get better PT Goal Formulation: With patient Time For Goal Achievement: 03/15/23 Potential to Achieve Goals: Good    Frequency 7X/week     Co-evaluation               AM-PAC PT "6 Clicks" Mobility  Outcome Measure Help needed turning from your back to your side while in a flat bed without using bedrails?: A  Little Help needed moving from lying on your back to sitting on the side of a flat bed without using bedrails?: A Little Help needed moving to and from a bed to a chair (including a wheelchair)?: A Little Help needed standing up from a chair using your arms (e.g., wheelchair or bedside chair)?: A Little Help needed to walk in hospital room?: A Little Help needed climbing 3-5 steps with a railing? : A Little 6 Click Score: 18    End of Session Equipment Utilized During Treatment: Gait belt Activity Tolerance: Patient tolerated treatment well;Patient limited by fatigue;Patient limited by pain Patient left: with call bell/phone within reach;with bed alarm set;in bed Nurse Communication: Mobility status PT Visit Diagnosis: Unsteadiness on feet (R26.81);Difficulty in walking, not elsewhere classified (R26.2);Pain;Muscle weakness (generalized) (M62.81) Pain - Right/Left: Right Pain - part of body: Hip    Time: 5621-3086 PT Time Calculation (min) (ACUTE ONLY): 20 min   Charges:   PT Evaluation $PT Eval Moderate Complexity: 1 Mod $PT Re-evaluation: 1 Re-eval PT Treatments $Gait Training: 8-22 mins PT General Charges $$ ACUTE  PT VISIT: 1 Visit         Georgie Eduardo, PT, GCS 03/01/23,11:05 AM

## 2023-03-01 NOTE — Plan of Care (Signed)
  Problem: Education: Goal: Knowledge of General Education information will improve Description: Including pain rating scale, medication(s)/side effects and non-pharmacologic comfort measures Outcome: Progressing   Problem: Clinical Measurements: Goal: Will remain free from infection Outcome: Progressing   Problem: Clinical Measurements: Goal: Respiratory complications will improve Outcome: Progressing   

## 2023-03-01 NOTE — Care Management Important Message (Signed)
Important Message  Patient Details  Name: Marilyn Romero MRN: [id number] Date of Birth: Dec 09, 1933   Medicare Important Message Given:  Yes  Per MD note patient is disoriented so I reviewed the Important Message from Medicare with her daughter, Ellison Leisure by phone [phone]). She stated she understood these rights and was told her mother wouldn't discharge until Monday. I wished her mother a speedy recovery and thanked her for her time.     Olegario Messier A Julianny Milstein 03/01/2023, 2:30 PM

## 2023-03-02 DIAGNOSIS — M24451 Recurrent dislocation, right hip: Secondary | ICD-10-CM | POA: Diagnosis not present

## 2023-03-02 DIAGNOSIS — I10 Essential (primary) hypertension: Secondary | ICD-10-CM | POA: Diagnosis not present

## 2023-03-02 DIAGNOSIS — R410 Disorientation, unspecified: Secondary | ICD-10-CM | POA: Diagnosis not present

## 2023-03-02 DIAGNOSIS — D72829 Elevated white blood cell count, unspecified: Secondary | ICD-10-CM | POA: Diagnosis not present

## 2023-03-02 LAB — CBC
HCT: 36.1 % (ref 36.0–46.0)
Hemoglobin: 11.6 g/dL — ABNORMAL LOW (ref 12.0–15.0)
MCH: 28.3 pg (ref 26.0–34.0)
MCHC: 32.1 g/dL (ref 30.0–36.0)
MCV: 88 fL (ref 80.0–100.0)
Platelets: 296 10*3/uL (ref 150–400)
RBC: 4.1 MIL/uL (ref 3.87–5.11)
RDW: 13.9 % (ref 11.5–15.5)
WBC: 11.3 10*3/uL — ABNORMAL HIGH (ref 4.0–10.5)
nRBC: 0 % (ref 0.0–0.2)

## 2023-03-02 MED ORDER — QUETIAPINE FUMARATE 25 MG PO TABS: 12.5000 mg | ORAL_TABLET | Freq: Every evening | ORAL | Status: DC | PRN

## 2023-03-02 MED ORDER — POLYETHYLENE GLYCOL 3350 17 G PO PACK
17.0000 g | PACK | Freq: Every day | ORAL | Status: DC
  Administered 2023-03-02 – 2023-03-03 (×2): 17 g via ORAL
  Filled 2023-03-02 (×2): qty 1

## 2023-03-02 NOTE — Progress Notes (Signed)
   Subjective: 2 Days Post-Op Procedure(s) (LRB): TOTAL HIP ARTHROPLASTY REVISION WITH CONVERSION TO CONSTRAINED LINER (Right) Patient is sleeping well this morning. Able to arouse from sleep slightly but does not open her eyes this morning. No visible signs of pain this morning. Continue with PT today as tolerated, plan for SNF following discharge.  Objective: Vital signs in last 24 hours: Temp:  [97.6 F (36.4 C)] 97.6 F (36.4 C) (08/03 0049) Pulse Rate:  [76] 76 (08/03 0049) Resp:  [18] 18 (08/03 0049) BP: (109-152)/(48-72) 109/48 (08/03 0049) SpO2:  [99 %-100 %] 99 % (08/03 0049)  Intake/Output from previous day: 08/02 0701 - 08/03 0700 In: 240 [P.O.:240] Out: -  Intake/Output this shift: No intake/output data recorded.  Recent Labs    03/01/23 0521 03/02/23 0423  HGB 13.0 11.6*   Recent Labs    03/01/23 0521 03/02/23 0423  WBC 17.3* 11.3*  RBC 4.57 4.10  HCT 40.2 36.1  PLT 268 296   Recent Labs    03/01/23 0521 03/02/23 0422  NA 140 138  K 4.6 3.8  CL 106 107  CO2 22 25  BUN 15 22  CREATININE 0.71 0.64  GLUCOSE 120* 116*  CALCIUM 9.4 9.1   No results for input(s): "LABPT", "INR" in the last 72 hours.  EXAM General - Patient is sleeping well.  Does not open her eyes but she does shake her head to answer questions. Extremity - Neurovascular intact Sensation intact distally Intact pulses distally Dorsiflexion/Plantar flexion intact No cellulitis present Compartment soft Dressing - dressing C/D/I and no drainage Motor Function - intact, moving foot and toes well on exam.   Past Medical History:  Diagnosis Date   Cancer of sigmoid colon (HCC) 12/08/2014   Colonic mass 10/07/2012   Overview:  Gets routine colonoscopies. Dx was 2013.   DDD (degenerative disc disease), lumbar 12/16/2014   Diverticulitis 06/27/2011   Facet syndrome, lumbar 12/16/2014   GERD (gastroesophageal reflux disease) 05/26/2012   Insomnia 06/18/2016   Last Assessment &  Plan:  Relevant Hx: Course: Daily Update: Today's Plan:   Lesion of bladder 04/29/2012   Lumbar radiculopathy 10/04/2015   Sacroiliac joint dysfunction of both sides 12/16/2014   Status post total replacement of right hip 08/15/2015    Assessment/Plan:   2 Days Post-Op Procedure(s) (LRB): TOTAL HIP ARTHROPLASTY REVISION WITH CONVERSION TO CONSTRAINED LINER (Right) Principal Problem:   Acute delirium Active Problems:   DDD (degenerative disc disease), lumbar   Essential hypertension   Leukocytosis   Recurrent dislocation of right hip  Estimated body mass index is 18.3 kg/m as calculated from the following:   Height as of this encounter: 5\' 5"  (1.651 m).   Weight as of this encounter: 49.9 kg. Advance diet Up with therapy, weightbearing as tolerated.  Posterior hip precautions Vital signs are stable.  BP 120/50. Labs are stable.  WBC 11.3, Hg 11.6 Pain well-controlled Care management to assist with discharge to skilled nursing facility  Patient sleeping this AM, does shake her head to answer questions but does not open her eyes.  Follow-up with [facility] orthopedics in 2 weeks Lovenox 40 mg subcu daily x 14 days at discharge.  DVT Prophylaxis - Lovenox, TED hose, and SCDs Weight-Bearing as tolerated to right leg   J. Horris Latino, PA-C [facility] 03/02/2023, 8:50 AM

## 2023-03-02 NOTE — Progress Notes (Signed)
Physical Therapy Treatment Patient Details Name: Marilyn Romero MRN: [id number] DOB: 30-Jun-1934 Today's Date: 03/02/2023   History of Present Illness Pt is an 89 y/o F who presents with R hip dislocation (multiple dislocations/reductions since original surgery). PMH of HTN, OA, HLD, falls, GERD, colon CA. R hip THA 02/28/23.    PT Comments  Pt limited by R LE pain today; pt reported mild pain at beginning of session but after walking a few feet requested to lie down due to R LE pain and became tearful indicating pain being severe. RN notified.  Pt ambulated 12' with RW  and performed transfers at a min A level.  Pt will cont to benefit from skilled PT to address functional mobility and stair negotiation.    If plan is discharge home, recommend the following: A little help with walking and/or transfers;A little help with bathing/dressing/bathroom;Help with stairs or ramp for entrance;Assist for transportation   Can travel by private vehicle     No  Equipment Recommendations  Rolling walker (2 wheels)    Recommendations for Other Services       Precautions / Restrictions Precautions Precautions: Posterior Hip Precaution Booklet Issued: No Precaution Comments: per MD, posterior hip precaution Restrictions Weight Bearing Restrictions: No     Mobility  Bed Mobility               General bed mobility comments: NT pt up in recliner.    Transfers Overall transfer level: Needs assistance Equipment used: Rolling walker (2 wheels) Transfers: Sit to/from Stand Sit to Stand: Min assist           General transfer comment: Multimodal cueing for safe use of DME, safety    Ambulation/Gait Ambulation/Gait assistance: Min guard Gait Distance (Feet): 12 Feet Assistive device: Rolling walker (2 wheels) Gait Pattern/deviations: Decreased step length - right, Decreased stride length, Step-to pattern, Decreased stance time - right, Decreased step length - left, Trunk flexed Gait  velocity: decreased     General Gait Details: pt reported mild pain at beginning of session but after walking a few feet requested to sit down due to R LE pain and became tearful indicating pain being severe. RN notified.  Noted Right posterior rotation of pelvis during gait.   Stairs             Wheelchair Mobility     Tilt Bed    Modified Rankin (Stroke Patients Only)       Balance Overall balance assessment: Needs assistance   Sitting balance-Leahy Scale: Fair Sitting balance - Comments: difficulty shifting weight to scoot back in recliner.   Standing balance support: Reliant on assistive device for balance, During functional activity, Bilateral upper extremity supported Standing balance-Leahy Scale: Fair Standing balance comment: No LOB during ambulation.                            Cognition Arousal/Alertness: Awake/alert Behavior During Therapy: WFL for tasks assessed/performed Overall Cognitive Status: Impaired/Different from baseline Area of Impairment: Safety/judgement, Following commands, Awareness, Problem solving                             Problem Solving: Slow processing, Requires verbal cues, Requires tactile cues, Difficulty sequencing General Comments: pt tearful when R LE became more painful during gait.        Exercises Total Joint Exercises Quad Sets: AAROM, Strengthening, Both, 10 reps  General Comments        Pertinent Vitals/Pain Pain Assessment Pain Assessment: Faces Faces Pain Scale: Hurts whole lot Pain Location: pt reported mild pain at beginning of session but after walking a few feet requested to sit down due to R LE pain and became tearful indicating pain being severe.  RN notified. Pain Descriptors / Indicators: Aching Pain Intervention(s): Monitored during session, Limited activity within patient's tolerance, Patient requesting pain meds-RN notified    Home Living                           Prior Function            PT Goals (current goals can now be found in the care plan section) Acute Rehab PT Goals Patient Stated Goal: get better PT Goal Formulation: With patient Time For Goal Achievement: 03/15/23 Progress towards PT goals: Progressing toward goals    Frequency    7X/week      PT Plan      Co-evaluation              AM-PAC PT "6 Clicks" Mobility   Outcome Measure  Help needed turning from your back to your side while in a flat bed without using bedrails?: A Little Help needed moving from lying on your back to sitting on the side of a flat bed without using bedrails?: A Little Help needed moving to and from a bed to a chair (including a wheelchair)?: A Little Help needed standing up from a chair using your arms (e.g., wheelchair or bedside chair)?: A Little Help needed to walk in hospital room?: A Little Help needed climbing 3-5 steps with a railing? : A Little 6 Click Score: 18    End of Session Equipment Utilized During Treatment: Gait belt Activity Tolerance: Patient limited by fatigue;Patient limited by pain Patient left: with call bell/phone within reach;in chair;with chair alarm set Nurse Communication: Mobility status PT Visit Diagnosis: Unsteadiness on feet (R26.81);Difficulty in walking, not elsewhere classified (R26.2);Pain;Muscle weakness (generalized) (M62.81) Pain - Right/Left: Right Pain - part of body: Hip     Time: 2952-8413 PT Time Calculation (min) (ACUTE ONLY): 15 min  Charges:    $Gait Training: 8-22 mins PT General Charges $$ ACUTE PT VISIT: 1 Visit                     Hortencia Conradi, PTA  03/02/23, 11:02 AM

## 2023-03-02 NOTE — Progress Notes (Signed)
Occupational Therapy Treatment Patient Details Name: Marilyn Romero MRN: [id number] DOB: 12/07/1933 Today's Date: 03/02/2023   History of present illness Pt is an 89 y/o F who presents with R hip dislocation (multiple dislocations/reductions since original surgery). PMH of HTN, OA, HLD, falls, GERD, colon CA. R hip THA 02/28/23.   OT comments  Pt received semi-reclined in bed, RN in room. Appearing sleepy; willing to work with OT on transfer to chair and sitting up for breakfast. T/f MIN A with RW. See flowsheet below for further details of session. Left seated in recliner, chair alarm on, pt set up for breakfast with all needs in reach. Pt was able to point to "which button you hit when you need help" on the call bell when OT asked her to identify it.    Recommendations for follow up therapy are one component of a multi-disciplinary discharge planning process, led by the attending physician.  Recommendations may be updated based on patient status, additional functional criteria and insurance authorization.    Assistance Recommended at Discharge Frequent or constant Supervision/Assistance  Patient can return home with the following  A little help with walking and/or transfers;A lot of help with bathing/dressing/bathroom;Assistance with cooking/housework;Direct supervision/assist for medications management;Direct supervision/assist for financial management;Assist for transportation;Help with stairs or ramp for entrance   Equipment Recommendations  None recommended by OT    Recommendations for Other Services      Precautions / Restrictions Precautions Precautions: Posterior Hip Precaution Booklet Issued: No Precaution Comments: per MD, posterior hip precaution Restrictions Weight Bearing Restrictions: No       Mobility Bed Mobility Overal bed mobility: Needs Assistance Bed Mobility: Supine to Sit     Supine to sit: Min assist, HOB elevated     General bed mobility comments: HOB  raised, cues, MIN A with trunk and legs    Transfers Overall transfer level: Needs assistance Equipment used: Rolling walker (2 wheels) Transfers: Sit to/from Stand Sit to Stand: Min assist           General transfer comment: RW and gait belt. Pt putting RUE on RW with LUE assisting; pt wanting BIL hands on RW during sit to stand     Balance Overall balance assessment: Mild deficits observed, not formally tested                                         ADL either performed or assessed with clinical judgement   ADL Overall ADL's : Needs assistance/impaired Eating/Feeding: Minimal assistance Eating/Feeding Details (indicate cue type and reason): OT assisting with opening containers, cutting breakfast food                                 Functional mobility during ADLs: Min guard;Rolling walker (2 wheels) General ADL Comments: OT using gait belt with CGA for pt to t/f from bed to chair during session today; multiple verbal cues for which way to move to line up correctly with chair.    Extremity/Trunk Assessment Upper Extremity Assessment Upper Extremity Assessment: Generalized weakness RUE Deficits / Details: Pt with hx of RUE non-surgical fracture per RN (pt unable to accurately describe) and therefore R hand is minimally functional; Pt able to make weak grip; unable to flex R shoulder; has to place R hand on RW with L hand.   Lower Extremity  Assessment Lower Extremity Assessment: Generalized weakness;RLE deficits/detail RLE Deficits / Details: s/p R THA        Vision       Perception     Praxis      Cognition Arousal/Alertness: Awake/alert Behavior During Therapy: WFL for tasks assessed/performed Overall Cognitive Status: Impaired/Different from baseline                                 General Comments: Pt with decreased communication today compared to when OT saw pt prior to surgery; able to follow one-step verbal  commands throughout session today.        Exercises      Shoulder Instructions       General Comments Pt on room air; appears sleepy during session, but awakens more while seated in chair; minimally verbal today. Able to eat breakfast.    Pertinent Vitals/ Pain       Pain Assessment Pain Assessment: PAINAD Breathing: normal Negative Vocalization: occasional moan/groan, low speech, negative/disapproving quality Facial Expression: sad, frightened, frown Body Language: relaxed Consolability: no need to console PAINAD Score: 2 Pain Location: R hip (presumably; surgical site) Pain Descriptors / Indicators: Grimacing Pain Intervention(s): Limited activity within patient's tolerance, Monitored during session, RN gave pain meds during session  Home Living                                          Prior Functioning/Environment              Frequency  Min 1X/week        Progress Toward Goals  OT Goals(current goals can now be found in the care plan section)  Progress towards OT goals: Progressing toward goals  Acute Rehab OT Goals Patient Stated Goal: Did not state a goal today OT Goal Formulation: With patient Time For Goal Achievement: 03/15/23 Potential to Achieve Goals: Good ADL Goals Pt Will Perform Lower Body Dressing: with modified independence;sit to/from stand Pt Will Transfer to Toilet: with modified independence;ambulating;bedside commode Pt Will Perform Toileting - Clothing Manipulation and hygiene: with modified independence;sit to/from stand  Plan Discharge plan remains appropriate    Co-evaluation                 AM-PAC OT "6 Clicks" Daily Activity     Outcome Measure   Help from another person eating meals?: A Little Help from another person taking care of personal grooming?: A Little Help from another person toileting, which includes using toliet, bedpan, or urinal?: A Little Help from another person bathing (including  washing, rinsing, drying)?: A Lot Help from another person to put on and taking off regular upper body clothing?: A Little Help from another person to put on and taking off regular lower body clothing?: A Lot 6 Click Score: 16    End of Session Equipment Utilized During Treatment: Rolling walker (2 wheels);Gait belt  OT Visit Diagnosis: Unsteadiness on feet (R26.81);Muscle weakness (generalized) (M62.81)   Activity Tolerance Patient tolerated treatment well   Patient Left in chair;with call bell/phone within reach;with chair alarm set   Nurse Communication Mobility status        Time: 4540-9811 OT Time Calculation (min): 18 min  Charges: OT General Charges $OT Visit: 1 Visit OT Treatments $Self Care/Home Management : 8-22 mins  Linward Foster, MS, OTR/L  Alvester Morin 03/02/2023, 11:33 AM

## 2023-03-02 NOTE — Plan of Care (Signed)
  Problem: Clinical Measurements: Goal: Ability to maintain clinical measurements within normal limits will improve Outcome: Progressing Goal: Will remain free from infection Outcome: Progressing Goal: Diagnostic test results will improve Outcome: Progressing Goal: Respiratory complications will improve Outcome: Progressing Goal: Cardiovascular complication will be avoided Outcome: Progressing   Problem: Activity: Goal: Risk for activity intolerance will decrease Outcome: Progressing   Problem: Nutrition: Goal: Adequate nutrition will be maintained Outcome: Progressing   Problem: Coping: Goal: Level of anxiety will decrease Outcome: Progressing   Problem: Elimination: Goal: Will not experience complications related to bowel motility Outcome: Progressing Goal: Will not experience complications related to urinary retention Outcome: Progressing   Problem: Safety: Goal: Ability to remain free from injury will improve Outcome: Progressing   Problem: Skin Integrity: Goal: Risk for impaired skin integrity will decrease Outcome: Progressing   Problem: Activity: Goal: Ability to ambulate and perform ADLs will improve Outcome: Progressing   Problem: Clinical Measurements: Goal: Postoperative complications will be avoided or minimized Outcome: Progressing   Problem: Self-Concept: Goal: Ability to maintain and perform role responsibilities to the fullest extent possible will improve Outcome: Progressing   Problem: Pain Management: Goal: Pain level will decrease Outcome: Progressing

## 2023-03-02 NOTE — Progress Notes (Signed)
Progress Note   Patient: Marilyn Romero:[id number] DOB: 04-12-1934 DOA: 02/26/2023     4 DOS: the patient was seen and examined on 03/02/2023   Brief hospital course: 89 y.o. Caucasian female with medical history significant for GERD, diverticulitis, lumbar radiculopathy, and essential hypertension, who presented to the emergency room l with right hip nontraumatic dislocation.  This is the fifth time patient had her prosthetic right hip joint dislocation. Closed reduction in the OR is performed on 7/30.  Revision of the right hip done 8/1.  8/2.  Patient is delirious this morning after surgery.  Did not sleep very well last night.  Patient not complaining of pain. 8/3.  Patient slept better last night with Seroquel.  Having more pain in her right hip today.  Did not walk as far with physical therapy today.  Seen while sitting in the chair.  Assessment and Plan: * Acute delirium Likely secondary to being in an unfamiliar environments, anesthesia and potentially pain medication.  Patient slept well last night with Seroquel.  Will make Seroquel as needed.  Patient able to hold the better conversation today than yesterday.  Improved from yesterday.  Recurrent dislocation of right hip Patient and close posterior right prosthetic hip reduction on 7/30.  Hip revision surgery on 8/1.  Pain control.  Start MiraLAX.  Essential hypertension On lisinopril  DDD (degenerative disc disease), lumbar Continue working with physical therapy.  Leukocytosis Likely reactive after surgery.        Subjective: Patient seen sitting in the chair eating breakfast.  Having more pain in her right hip today.  Slept well with Seroquel last night.  Admitted with recurrent dislocation of the right hip.  Physical Exam: Vitals:   03/01/23 0910 03/02/23 0049 03/02/23 0854 03/02/23 1427  BP: (!) 152/72 (!) 109/48 (!) 120/55 (!) 106/50  Pulse: 76 76 71 69  Resp: 18 18 16 20   Temp:  97.6 F (36.4 C) 98.1 F (36.7  C) (!) 97.5 F (36.4 C)  TempSrc:  Oral    SpO2: 100% 99% 98% 99%  Weight:      Height:       Physical Exam HENT:     Head: Normocephalic.     Mouth/Throat:     Pharynx: No oropharyngeal exudate.  Eyes:     General: Lids are normal.     Conjunctiva/sclera: Conjunctivae normal.  Cardiovascular:     Rate and Rhythm: Normal rate and regular rhythm.     Heart sounds: S1 normal and S2 normal. Murmur heard.     Systolic murmur is present with a grade of 2/6.  Pulmonary:     Breath sounds: No decreased breath sounds, wheezing, rhonchi or rales.  Abdominal:     Palpations: Abdomen is soft.     Tenderness: There is no abdominal tenderness.  Musculoskeletal:     Right lower leg: No swelling.     Left lower leg: No swelling.  Skin:    General: Skin is warm.     Findings: No rash.  Neurological:     Mental Status: She is alert.     Comments: Slightly able to lift right leg up off the chair.     Data Reviewed: Hemoglobin 11.6, white blood cell count of 1.3, platelet count 296  Family Communication: Updated patient's daughter on the phone  Disposition: Status is: Inpatient Remains inpatient appropriate because: Likely to [facility] on Monday  Planned Discharge Destination: Long-term care with physical therapy    Time spent:  28 minutes  Author: Alford Highland, MD 03/02/2023 3:05 PM  For on call review [email or web address].

## 2023-03-02 NOTE — Plan of Care (Signed)
  Problem: Clinical Measurements: Goal: Ability to maintain clinical measurements within normal limits will improve Outcome: Progressing Goal: Will remain free from infection Outcome: Progressing Goal: Diagnostic test results will improve Outcome: Progressing Goal: Respiratory complications will improve Outcome: Progressing Goal: Cardiovascular complication will be avoided Outcome: Progressing   Problem: Activity: Goal: Risk for activity intolerance will decrease Outcome: Progressing   Problem: Nutrition: Goal: Adequate nutrition will be maintained Outcome: Progressing   Problem: Coping: Goal: Level of anxiety will decrease Outcome: Progressing   Problem: Elimination: Goal: Will not experience complications related to bowel motility Outcome: Progressing Goal: Will not experience complications related to urinary retention Outcome: Progressing   Problem: Pain Managment: Goal: General experience of comfort will improve Outcome: Progressing   Problem: Safety: Goal: Ability to remain free from injury will improve Outcome: Progressing   Problem: Skin Integrity: Goal: Risk for impaired skin integrity will decrease Outcome: Progressing   Problem: Activity: Goal: Ability to ambulate and perform ADLs will improve Outcome: Progressing   Problem: Clinical Measurements: Goal: Postoperative complications will be avoided or minimized Outcome: Progressing   Problem: Self-Concept: Goal: Ability to maintain and perform role responsibilities to the fullest extent possible will improve Outcome: Progressing   Problem: Pain Management: Goal: Pain level will decrease Outcome: Progressing

## 2023-03-03 DIAGNOSIS — D649 Anemia, unspecified: Secondary | ICD-10-CM

## 2023-03-03 DIAGNOSIS — K5900 Constipation, unspecified: Secondary | ICD-10-CM | POA: Insufficient documentation

## 2023-03-03 DIAGNOSIS — I10 Essential (primary) hypertension: Secondary | ICD-10-CM | POA: Diagnosis not present

## 2023-03-03 DIAGNOSIS — K5909 Other constipation: Secondary | ICD-10-CM

## 2023-03-03 DIAGNOSIS — R410 Disorientation, unspecified: Secondary | ICD-10-CM | POA: Diagnosis not present

## 2023-03-03 DIAGNOSIS — M24451 Recurrent dislocation, right hip: Secondary | ICD-10-CM | POA: Diagnosis not present

## 2023-03-03 DIAGNOSIS — M5136 Other intervertebral disc degeneration, lumbar region: Secondary | ICD-10-CM | POA: Diagnosis not present

## 2023-03-03 LAB — CBC
HCT: 32.3 % — ABNORMAL LOW (ref 36.0–46.0)
Hemoglobin: 10.6 g/dL — ABNORMAL LOW (ref 12.0–15.0)
MCH: 28.7 pg (ref 26.0–34.0)
MCHC: 32.8 g/dL (ref 30.0–36.0)
MCV: 87.5 fL (ref 80.0–100.0)
Platelets: 288 10*3/uL (ref 150–400)
RBC: 3.69 MIL/uL — ABNORMAL LOW (ref 3.87–5.11)
RDW: 14 % (ref 11.5–15.5)
WBC: 8.6 10*3/uL (ref 4.0–10.5)
nRBC: 0 % (ref 0.0–0.2)

## 2023-03-03 LAB — BASIC METABOLIC PANEL WITH GFR
Anion gap: 7 (ref 5–15)
BUN: 22 mg/dL (ref 8–23)
CO2: 26 mmol/L (ref 22–32)
Calcium: 9.3 mg/dL (ref 8.9–10.3)
Chloride: 106 mmol/L (ref 98–111)
Creatinine, Ser: 0.57 mg/dL (ref 0.44–1.00)
GFR, Estimated: >60 mL/min (ref >60)
Glucose, Bld: 89 mg/dL (ref 70–99)
Potassium: 4.1 mmol/L (ref 3.5–5.1)
Sodium: 139 mmol/L (ref 135–145)

## 2023-03-03 MED ORDER — BISACODYL 5 MG PO TBEC: 5.0000 mg | DELAYED_RELEASE_TABLET | Freq: Every day | ORAL | Status: DC | PRN

## 2023-03-03 MED ORDER — LACTULOSE 10 GM/15ML PO SOLN
30.0000 g | Freq: Once | ORAL | Status: AC
  Administered 2023-03-03: 30 g via ORAL
  Filled 2023-03-03: qty 60

## 2023-03-03 MED ORDER — QUETIAPINE FUMARATE 25 MG PO TABS
12.5000 mg | ORAL_TABLET | Freq: Every day | ORAL | Status: DC
  Administered 2023-03-03: 12.5 mg via ORAL
  Filled 2023-03-03: qty 1

## 2023-03-03 MED ORDER — HYDROCODONE-ACETAMINOPHEN 5-325 MG PO TABS: 1.0000 | ORAL_TABLET | Freq: Four times a day (QID) | ORAL | Status: DC | PRN

## 2023-03-03 MED ORDER — POLYETHYLENE GLYCOL 3350 17 G PO PACK
17.0000 g | PACK | Freq: Two times a day (BID) | ORAL | Status: DC
  Administered 2023-03-03 – 2023-03-04 (×2): 17 g via ORAL
  Filled 2023-03-03 (×2): qty 1

## 2023-03-03 NOTE — Assessment & Plan Note (Signed)
Patient had a large bowel movement today.  Continue MiraLAX.

## 2023-03-03 NOTE — Progress Notes (Signed)
Physical Therapy Treatment Patient Details Name: Marilyn Romero MRN: [id number] DOB: Nov 10, 1933 Today's Date: 03/03/2023   History of Present Illness Pt is an 89 y/o F who presents with R hip dislocation (multiple dislocations/reductions since original surgery). PMH of HTN, OA, HLD, falls, GERD, colon CA. R hip THA 02/28/23.    PT Comments  Premedicated for session.  She is able to get to EOB with time and slight HOB elevated.  Steady in sitting.  Hesitant to stand but does so with cues and increased time.  Standing ex prior to gait.  She is able to walk 100' today with slow gait with +1 assist for safety.  Self selects distance due to fatigue.  Remained in chair and completes seated AROM.  Remained in chair with needs met and safety precautions in place.   If plan is discharge home, recommend the following: A little help with walking and/or transfers;A little help with bathing/dressing/bathroom;Help with stairs or ramp for entrance;Assist for transportation   Can travel by private vehicle        Equipment Recommendations  Rolling walker (2 wheels)    Recommendations for Other Services       Precautions / Restrictions Precautions Precautions: Posterior Hip Precaution Booklet Issued: No Precaution Comments: per MD, posterior hip precaution Restrictions Weight Bearing Restrictions: No     Mobility  Bed Mobility Overal bed mobility: Needs Assistance Bed Mobility: Supine to Sit     Supine to sit: Min assist, HOB elevated, Min guard     General bed mobility comments: HOB slightly raised and increased time Patient Response: Cooperative  Transfers Overall transfer level: Needs assistance Equipment used: Rolling walker (2 wheels) Transfers: Sit to/from Stand Sit to Stand: Min assist           General transfer comment: RW and gait belt. Pt putting RUE on RW with LUE assisting; pt wanting BIL hands on RW during sit to stand    Ambulation/Gait Ambulation/Gait assistance: Min  guard, Min assist Gait Distance (Feet): 100 Feet Assistive device: Rolling walker (2 wheels) Gait Pattern/deviations: Decreased step length - right, Decreased stride length, Step-to pattern, Decreased stance time - right, Decreased step length - left, Trunk flexed Gait velocity: decreased     General Gait Details: self selects distance due to fatigue   Stairs             Wheelchair Mobility     Tilt Bed Tilt Bed Patient Response: Cooperative  Modified Rankin (Stroke Patients Only)       Balance Overall balance assessment: Needs assistance Sitting-balance support: Feet supported Sitting balance-Leahy Scale: Fair     Standing balance support: Reliant on assistive device for balance, During functional activity, Bilateral upper extremity supported Standing balance-Leahy Scale: Fair                              Cognition Arousal/Alertness: Awake/alert Behavior During Therapy: WFL for tasks assessed/performed Overall Cognitive Status: Within Functional Limits for tasks assessed                                          Exercises Other Exercises Other Exercises: seated AROM and standing AROM prior to gait    General Comments        Pertinent Vitals/Pain Pain Assessment Pain Assessment: Faces Faces Pain Scale: Hurts little more Pain Location:  R hip (presumably; surgical site) Pain Descriptors / Indicators: Grimacing, Sore Pain Intervention(s): Limited activity within patient's tolerance, Monitored during session, Premedicated before session, Repositioned    Home Living                          Prior Function            PT Goals (current goals can now be found in the care plan section) Progress towards PT goals: Progressing toward goals    Frequency    7X/week      PT Plan Current plan remains appropriate    Co-evaluation              AM-PAC PT "6 Clicks" Mobility   Outcome Measure  Help needed  turning from your back to your side while in a flat bed without using bedrails?: A Little Help needed moving from lying on your back to sitting on the side of a flat bed without using bedrails?: A Little Help needed moving to and from a bed to a chair (including a wheelchair)?: A Little Help needed standing up from a chair using your arms (e.g., wheelchair or bedside chair)?: A Little Help needed to walk in hospital room?: A Little Help needed climbing 3-5 steps with a railing? : A Little 6 Click Score: 18    End of Session Equipment Utilized During Treatment: Gait belt Activity Tolerance: Patient tolerated treatment well;Patient limited by fatigue Patient left: in chair;with call bell/phone within reach;with chair alarm set Nurse Communication: Mobility status PT Visit Diagnosis: Unsteadiness on feet (R26.81);Difficulty in walking, not elsewhere classified (R26.2);Pain;Muscle weakness (generalized) (M62.81) Pain - Right/Left: Right Pain - part of body: Hip     Time: 1041-1110 PT Time Calculation (min) (ACUTE ONLY): 29 min  Charges:    $Gait Training: 8-22 mins $Therapeutic Exercise: 8-22 mins PT General Charges $$ ACUTE PT VISIT: 1 Visit                   Danielle Dess, PTA 03/03/23, 1:23 PM

## 2023-03-03 NOTE — Progress Notes (Signed)
Progress Note   Patient: Marilyn Romero FAO:[id number] DOB: [birth year] DOA: 02/26/2023     5 DOS: the patient was seen and examined on 03/03/2023   Brief hospital course: 89 y.o. Caucasian female with medical history significant for GERD, diverticulitis, lumbar radiculopathy, and essential hypertension, who presented to the emergency room l with right hip nontraumatic dislocation.  This is the fifth time patient had her prosthetic right hip joint dislocation. Closed reduction in the OR is performed on 7/30.  Revision of the right hip done 8/1.  8/2.  Patient is delirious this morning after surgery.  Did not sleep very well last night.  Patient not complaining of pain. 8/3.  Patient slept better last night with Seroquel.  Having more pain in her right hip today.  Did not walk as far with physical therapy today.  Seen while sitting in the chair. 8/4.  Patient asking a lot of questions today and was confused what happened.  I went over hospital course with her and asked her to remember things from now moving forward.  Assessment and Plan: * Acute delirium Likely secondary to being in an unfamiliar environments, anesthesia and potentially pain medication.  Will give Seroquel standing dose tonight instead of as needed.  Patient asking more questions today about what happened during the hospital course.  Recurrent dislocation of right hip Patient and close posterior right prosthetic hip reduction on 7/30.  Hip revision surgery on 8/1.  Pain control.  Continue twice daily MiraLAX and give a dose of lactulose.  Essential hypertension On lisinopril  DDD (degenerative disc disease), lumbar Continue working with physical therapy.  Constipation Continue MiraLAX twice daily and will give a dose of lactulose  Leukocytosis Likely reactive after surgery.  Now within normal range  Postoperative anemia Hemoglobin drifted down to 10.6.        Subjective: Patient asking a bunch of questions this  morning was confused about things.  I reiterated that he she was in the hospital and that she was confused over the last couple days.  I asked her to try to remember things now moving forward.  Physical Exam: Vitals:   03/02/23 1427 03/02/23 1846 03/03/23 0000 03/03/23 0813  BP: (!) 106/50  107/78 (!) 137/55  Pulse: 69  62 65  Resp: 20  18 14   Temp: (!) 97.5 F (36.4 C) 97.6 F (36.4 C) (!) 97.5 F (36.4 C) 97.6 F (36.4 C)  TempSrc:  Axillary Axillary   SpO2: 99%  100% 99%  Weight:      Height:       Physical Exam HENT:     Head: Normocephalic.     Mouth/Throat:     Pharynx: No oropharyngeal exudate.  Eyes:     General: Lids are normal.     Conjunctiva/sclera: Conjunctivae normal.  Cardiovascular:     Rate and Rhythm: Normal rate and regular rhythm.     Heart sounds: S1 normal and S2 normal. Murmur heard.     Systolic murmur is present with a grade of 2/6.  Pulmonary:     Breath sounds: No decreased breath sounds, wheezing, rhonchi or rales.  Abdominal:     Palpations: Abdomen is soft.     Tenderness: There is no abdominal tenderness.  Musculoskeletal:     Right lower leg: No swelling.     Left lower leg: No swelling.  Skin:    General: Skin is warm.     Findings: No rash.  Neurological:  Mental Status: She is alert.     Comments: Slightly able to straight leg raise with the right leg today.     Data Reviewed: Hemoglobin 10.6  Family Communication: Updated patient's daughter on the phone  Disposition: Status is: Inpatient Remains inpatient appropriate because: Would likely go out to rehab tomorrow if mental status improved.  Planned Discharge Destination: Rehab    Time spent: 28 minutes  Author: Alford Highland, MD 03/03/2023 1:18 PM  For on call review [email or web address].

## 2023-03-03 NOTE — TOC Progression Note (Signed)
Transition of Care [facility]) - Progression Note    Patient Details  Name: Marilyn Romero MRN: [id number] Date of Birth: Apr 19, 1934  [facility]) CM/SW Contact  Garret Reddish, RN Phone Number: 03/03/2023, 4:24 PM  Clinical Narrative:   [facility]  SNF authorization submitted.  SNF authorization pending.  Pending SNF authorization number is [id number].  TOC will continue to follow for discharge planning.       Expected Discharge Plan: Skilled Nursing Facility Barriers to Discharge: Continued Medical Work up  Expected Discharge Plan and Services                                               Social Determinants of Health (SDOH) Interventions SDOH Screenings   Food Insecurity: Patient Declined (02/26/2023)  Housing: Patient Declined (02/26/2023)  Recent Concern: Housing - High Risk (01/24/2023)  Transportation Needs: Patient Declined (02/26/2023)  Utilities: Patient Declined (02/26/2023)  Depression (PHQ2-9): Low Risk  (06/27/2022)  Tobacco Use: Low Risk  (02/28/2023)    Readmission Risk Interventions    02/26/2023    8:39 AM  Readmission Risk Prevention Plan  Transportation Screening Complete  PCP or Specialist Appt within 3-5 Days Complete  HRI or Home Care Consult Complete  Social Work Consult for Recovery Care Planning/Counseling Complete  Palliative Care Screening Not Applicable  Medication Review Oceanographer) Referral to Pharmacy

## 2023-03-03 NOTE — Plan of Care (Signed)
  Problem: Clinical Measurements: Goal: Ability to maintain clinical measurements within normal limits will improve Outcome: Progressing Goal: Will remain free from infection Outcome: Progressing Goal: Diagnostic test results will improve Outcome: Progressing Goal: Respiratory complications will improve Outcome: Progressing Goal: Cardiovascular complication will be avoided Outcome: Progressing   Problem: Activity: Goal: Risk for activity intolerance will decrease Outcome: Progressing   Problem: Nutrition: Goal: Adequate nutrition will be maintained Outcome: Progressing   Problem: Coping: Goal: Level of anxiety will decrease Outcome: Progressing   Problem: Elimination: Goal: Will not experience complications related to bowel motility Outcome: Progressing Goal: Will not experience complications related to urinary retention Outcome: Progressing   Problem: Pain Managment: Goal: General experience of comfort will improve Outcome: Progressing   Problem: Safety: Goal: Ability to remain free from injury will improve Outcome: Progressing   Problem: Skin Integrity: Goal: Risk for impaired skin integrity will decrease Outcome: Progressing   Problem: Activity: Goal: Ability to ambulate and perform ADLs will improve Outcome: Progressing   Problem: Clinical Measurements: Goal: Postoperative complications will be avoided or minimized Outcome: Progressing   Problem: Self-Concept: Goal: Ability to maintain and perform role responsibilities to the fullest extent possible will improve Outcome: Progressing   Problem: Pain Management: Goal: Pain level will decrease Outcome: Progressing

## 2023-03-03 NOTE — Progress Notes (Signed)
   Subjective: 3 Days Post-Op Procedure(s) (LRB): TOTAL HIP ARTHROPLASTY REVISION WITH CONVERSION TO CONSTRAINED LINER (Right) Patient is sleeping well this morning.  More responsive to questions this morning. Able to arouse from sleep and she does open her eyes this morning. No visible signs of pain this morning.  Reports mild pain in the right hip. Continue with PT today as tolerated, plan for SNF following discharge.  Objective: Vital signs in last 24 hours: Temp:  [97.5 F (36.4 C)-98.1 F (36.7 C)] 97.5 F (36.4 C) (08/04 0000) Pulse Rate:  [62-71] 62 (08/04 0000) Resp:  [16-20] 18 (08/04 0000) BP: (106-120)/(50-78) 107/78 (08/04 0000) SpO2:  [98 %-100 %] 100 % (08/04 0000)  Intake/Output from previous day: 08/03 0701 - 08/04 0700 In: 360 [P.O.:360] Out: 0  Intake/Output this shift: No intake/output data recorded.  Recent Labs    03/01/23 0521 03/02/23 0423  HGB 13.0 11.6*   Recent Labs    03/01/23 0521 03/02/23 0423  WBC 17.3* 11.3*  RBC 4.57 4.10  HCT 40.2 36.1  PLT 268 296   Recent Labs    03/01/23 0521 03/02/23 0422  NA 140 138  K 4.6 3.8  CL 106 107  CO2 22 25  BUN 15 22  CREATININE 0.71 0.64  GLUCOSE 120* 116*  CALCIUM 9.4 9.1   No results for input(s): "LABPT", "INR" in the last 72 hours.  EXAM General - Patient is sleeping well.  Opens her eyes and answers questions appropriately this morning. Extremity - Neurovascular intact Sensation intact distally Intact pulses distally Dorsiflexion/Plantar flexion intact No cellulitis present Compartment soft Dressing - dressing C/D/I and no drainage Motor Function - intact, moving foot and toes well on exam.   Past Medical History:  Diagnosis Date   Cancer of sigmoid colon (HCC) 12/08/2014   Colonic mass 10/07/2012   Overview:  Gets routine colonoscopies. Dx was 2013.   DDD (degenerative disc disease), lumbar 12/16/2014   Diverticulitis 06/27/2011   Facet syndrome, lumbar 12/16/2014   GERD  (gastroesophageal reflux disease) 05/26/2012   Insomnia 06/18/2016   Last Assessment & Plan:  Relevant Hx: Course: Daily Update: Today's Plan:   Lesion of bladder 04/29/2012   Lumbar radiculopathy 10/04/2015   Sacroiliac joint dysfunction of both sides 12/16/2014   Status post total replacement of right hip 08/15/2015    Assessment/Plan:   3 Days Post-Op Procedure(s) (LRB): TOTAL HIP ARTHROPLASTY REVISION WITH CONVERSION TO CONSTRAINED LINER (Right) Principal Problem:   Acute delirium Active Problems:   DDD (degenerative disc disease), lumbar   Essential hypertension   Leukocytosis   Recurrent dislocation of right hip  Estimated body mass index is 18.3 kg/m as calculated from the following:   Height as of this encounter: 5\' 5"  (1.651 m).   Weight as of this encounter: 49.9 kg. Advance diet Up with therapy, weightbearing as tolerated.  Posterior hip precautions Vital signs are stable. Labs are stable.  WBC 11.3, Hg 11.6 yesterday. Pain well-controlled Care management to assist with discharge to skilled nursing facility  Follow-up with [facility] in 2 weeks Lovenox 40 mg subcu daily x 14 days at discharge.  DVT Prophylaxis - Lovenox, TED hose, and SCDs Weight-Bearing as tolerated to right leg  J. Horris Latino, PA-C [facility] 03/03/2023, 7:37 AM

## 2023-03-03 NOTE — Assessment & Plan Note (Signed)
Hemoglobin drifted down to 10.6.

## 2023-03-04 ENCOUNTER — Encounter: Payer: Self-pay | Admitting: Orthopedic Surgery

## 2023-03-04 ENCOUNTER — Inpatient Hospital Stay: Payer: 59

## 2023-03-04 DIAGNOSIS — M24451 Recurrent dislocation, right hip: Secondary | ICD-10-CM | POA: Diagnosis not present

## 2023-03-04 DIAGNOSIS — R29898 Other symptoms and signs involving the musculoskeletal system: Secondary | ICD-10-CM | POA: Insufficient documentation

## 2023-03-04 DIAGNOSIS — R410 Disorientation, unspecified: Secondary | ICD-10-CM | POA: Diagnosis not present

## 2023-03-04 DIAGNOSIS — M5136 Other intervertebral disc degeneration, lumbar region: Secondary | ICD-10-CM | POA: Diagnosis not present

## 2023-03-04 DIAGNOSIS — I10 Essential (primary) hypertension: Secondary | ICD-10-CM | POA: Diagnosis not present

## 2023-03-04 MED ORDER — ACETAMINOPHEN 325 MG PO TABS: 325.0000 mg | ORAL_TABLET | Freq: Four times a day (QID) | ORAL | Status: DC | PRN

## 2023-03-04 MED ORDER — POLYETHYLENE GLYCOL 3350 17 G PO PACK: 17.0000 g | PACK | Freq: Every day | ORAL | 0 refills | Status: DC

## 2023-03-04 MED ORDER — HYDROCODONE-ACETAMINOPHEN 5-325 MG PO TABS: 1.0000 | ORAL_TABLET | Freq: Four times a day (QID) | ORAL | 0 refills | Status: DC | PRN

## 2023-03-04 MED ORDER — QUETIAPINE FUMARATE 25 MG PO TABS: 12.5000 mg | ORAL_TABLET | Freq: Every evening | ORAL | 0 refills | Status: DC | PRN

## 2023-03-04 MED ORDER — LISINOPRIL 5 MG PO TABS
5.0000 mg | ORAL_TABLET | Freq: Every day | ORAL | Status: DC
  Filled 2023-03-04: qty 1

## 2023-03-04 MED ORDER — POLYETHYLENE GLYCOL 3350 17 G PO PACK: 17.0000 g | PACK | Freq: Every day | ORAL | Status: DC

## 2023-03-04 MED ORDER — ENOXAPARIN SODIUM 40 MG/0.4ML IJ SOSY: 40.0000 mg | PREFILLED_SYRINGE | INTRAMUSCULAR | 0 refills | Status: DC

## 2023-03-04 MED ORDER — LACTULOSE 10 GM/15ML PO SOLN
30.0000 g | Freq: Once | ORAL | Status: AC
  Administered 2023-03-04: 30 g via ORAL
  Filled 2023-03-04: qty 60

## 2023-03-04 NOTE — Care Management Important Message (Signed)
Important Message  Patient Details  Name: Marilyn Romero MRN: [id number] Date of Birth: 17-Apr-1934   Medicare Important Message Given:  Yes     Johnell Comings 03/04/2023, 10:41 AM

## 2023-03-04 NOTE — Assessment & Plan Note (Signed)
As per patient and daughter this has been going on for a while.  CT scan of the head negative.  Continue PT and OT at rehab.

## 2023-03-04 NOTE — TOC Progression Note (Signed)
Transition of Care [facility]) - Progression Note    Patient Details  Name: Marilyn Romero MRN: [id number] Date of Birth: [birth year]  Transition of Care [facility]) CM/SW Contact  Marlowe Sax, RN Phone Number: 03/04/2023, 10:34 AM  Clinical Narrative:    Spoke to the daughter [name] and let her know that Ins is pending to go back to [place] for rehab, if not approved will still return to [place] as she is a long term resident [name] will plan to Transport   Expected Discharge Plan: Skilled Nursing Facility Barriers to Discharge: Continued Medical Work up  Expected Discharge Plan and Services                                               Social Determinants of Health (SDOH) Interventions SDOH Screenings   Food Insecurity: Patient Declined (02/26/2023)  Housing: Patient Declined (02/26/2023)  Recent Concern: Housing - High Risk (01/24/2023)  Transportation Needs: Patient Declined (02/26/2023)  Utilities: Patient Declined (02/26/2023)  Depression (PHQ2-9): Low Risk  (06/27/2022)  Tobacco Use: Low Risk  (02/28/2023)    Readmission Risk Interventions    02/26/2023    8:39 AM  Readmission Risk Prevention Plan  Transportation Screening Complete  PCP or Specialist Appt within 3-5 Days Complete  HRI or Home Care Consult Complete  Social Work Consult for Recovery Care Planning/Counseling Complete  Palliative Care Screening Not Applicable  Medication Review Oceanographer) Referral to Pharmacy

## 2023-03-04 NOTE — Progress Notes (Signed)
Physical Therapy Treatment Patient Details Name: Marilyn Romero MRN: [id number] DOB: July 23, 1934 Today's Date: 03/04/2023   History of Present Illness Pt is an 89 y/o F who presents with R hip dislocation (multiple dislocations/reductions since original surgery). PMH of HTN, OA, HLD, falls, GERD, colon CA. R hip THA 02/28/23.    PT Comments  Pt seen for PT tx with pt agreeable, MD cleared pt for PT session. PT reviewed posterior hip precautions with pt but pt still crossing legs at end of session - PT placed pillow between pt's BLE for reminder. Pt is able to complete bed mobility with min assist for RLE & ambulate into hallway with RW & CGA<>supervision. Pt presents with RLE during gait but no overt LOB. Pt fatigued at end of session & left in recliner.     If plan is discharge home, recommend the following: A little help with walking and/or transfers;A little help with bathing/dressing/bathroom;Help with stairs or ramp for entrance;Assist for transportation   Can travel by private vehicle     Yes  Equipment Recommendations  None recommended by PT    Recommendations for Other Services       Precautions / Restrictions Precautions Precautions: Posterior Hip;Fall Precaution Booklet Issued: Yes (comment) Precaution Comments: per MD, posterior hip precaution Restrictions Weight Bearing Restrictions: Yes RLE Weight Bearing: Weight bearing as tolerated Other Position/Activity Restrictions: WBAT R LE     Mobility  Bed Mobility Overal bed mobility: Needs Assistance Bed Mobility: Supine to Sit     Supine to sit: HOB elevated, Min assist (assistance to move RLE to EOB)          Transfers Overall transfer level: Needs assistance Equipment used: Rolling walker (2 wheels) Transfers: Sit to/from Stand Sit to Stand: Supervision           General transfer comment: STS from EOB    Ambulation/Gait Ambulation/Gait assistance: Min guard, Supervision Gait Distance (Feet): 150  Feet Assistive device: Rolling walker (2 wheels) Gait Pattern/deviations: Decreased step length - left, Decreased step length - right, Decreased stride length, Decreased dorsiflexion - right, Decreased dorsiflexion - left, Decreased weight shift to right Gait velocity: decreased     General Gait Details: Education re: hand placement on RW, positioning in center of RW during gait. Pt with downward vs forwards gaze.   Stairs             Wheelchair Mobility     Tilt Bed    Modified Rankin (Stroke Patients Only)       Balance Overall balance assessment: Needs assistance Sitting-balance support: Feet supported Sitting balance-Leahy Scale: Good     Standing balance support: Reliant on assistive device for balance, During functional activity, Bilateral upper extremity supported Standing balance-Leahy Scale: Fair                              Cognition Arousal/Alertness: Awake/alert Behavior During Therapy: Flat affect Overall Cognitive Status: Within Functional Limits for tasks assessed Area of Impairment: Safety/judgement, Awareness, Memory                       Following Commands: Follows one step commands consistently Safety/Judgement: Decreased awareness of safety, Decreased awareness of deficits     General Comments: Pt able to recall 1/3 posterior hip precautions, PT educates her on 3/3. Pt crosses legs at end of session with PT re-educating pt on precautions.  Exercises      General Comments General comments (skin integrity, edema, etc.):  (Pt educated on role of PT, benefits of STR, Right hip Posterior precautions and exercises with good understanding)      Pertinent Vitals/Pain Pain Assessment Pain Assessment: No/denies pain    Home Living                          Prior Function            PT Goals (current goals can now be found in the care plan section) Acute Rehab PT Goals Patient Stated Goal: get  better PT Goal Formulation: With patient Time For Goal Achievement: 03/15/23 Potential to Achieve Goals: Good Progress towards PT goals: Progressing toward goals    Frequency    7X/week      PT Plan Current plan remains appropriate    Co-evaluation              AM-PAC PT "6 Clicks" Mobility   Outcome Measure  Help needed turning from your back to your side while in a flat bed without using bedrails?: None Help needed moving from lying on your back to sitting on the side of a flat bed without using bedrails?: A Little Help needed moving to and from a bed to a chair (including a wheelchair)?: A Little Help needed standing up from a chair using your arms (e.g., wheelchair or bedside chair)?: A Little Help needed to walk in hospital room?: A Little Help needed climbing 3-5 steps with a railing? : A Little 6 Click Score: 19    End of Session Equipment Utilized During Treatment: Gait belt Activity Tolerance: Patient tolerated treatment well;Patient limited by fatigue Patient left: in chair;with chair alarm set;with call bell/phone within reach Nurse Communication: Mobility status PT Visit Diagnosis: Unsteadiness on feet (R26.81);Difficulty in walking, not elsewhere classified (R26.2);Muscle weakness (generalized) (M62.81);Other abnormalities of gait and mobility (R26.89) Pain - Right/Left: Right Pain - part of body: Hip     Time: 1610-9604 PT Time Calculation (min) (ACUTE ONLY): 12 min  Charges:    $Therapeutic Activity: 8-22 mins PT General Charges $$ ACUTE PT VISIT: 1 Visit                     Aleda Grana, PT, DPT 03/04/23, 12:28 PM   Sandi Mariscal 03/04/2023, 12:27 PM

## 2023-03-04 NOTE — Progress Notes (Signed)
Physical Therapy Treatment Patient Details Name: Marilyn Romero MRN: [id number] DOB: 03/13/1934 Today's Date: 03/04/2023   History of Present Illness Pt is an 89 y/o F who presents with R hip dislocation (multiple dislocations/reductions since original surgery). PMH of HTN, OA, HLD, falls, GERD, colon CA. R hip THA 02/28/23.    PT Comments  Pt received EOB after breakfast. Discussed POC and provided education on current R Posterior Hip Precautions per Ortho. Pt given HEP with good understanding and demonstration. Sit<>stand with MinA and vc's for technique. Gait training with RW ~128ft limited by fatigue and R hip discomfort. Pt positioned to comfort in chair with all needs in reach. Will continue per POC until pt transitions to STR.   If plan is discharge home, recommend the following: A little help with walking and/or transfers;A little help with bathing/dressing/bathroom;Help with stairs or ramp for entrance;Assist for transportation   Can travel by private vehicle     Yes  Equipment Recommendations  Other (comment) (Pt has a Rollator and RW)    Recommendations for Other Services       Precautions / Restrictions Precautions Precautions: Posterior Hip Precaution Booklet Issued: Yes (comment) Precaution Comments: per MD, posterior hip precaution Restrictions Weight Bearing Restrictions: Yes Other Position/Activity Restrictions: WBAT R LE     Mobility  Bed Mobility Overal bed mobility: Needs Assistance Bed Mobility: Supine to Sit     Supine to sit: Min assist, HOB elevated, Min guard     General bed mobility comments: HOB slightly raised and increased time    Transfers Overall transfer level: Needs assistance Equipment used: Rolling walker (2 wheels) Transfers: Sit to/from Stand Sit to Stand: Min assist           General transfer comment: RW and gait belt. Pt putting RUE on RW with LUE assisting; pt wanting BIL hands on RW during sit to stand     Ambulation/Gait Ambulation/Gait assistance: Min guard Gait Distance (Feet): 100 Feet Assistive device: Rolling walker (2 wheels) Gait Pattern/deviations: Decreased step length - right, Decreased stride length, Step-to pattern, Decreased stance time - right, Decreased step length - left, Trunk flexed Gait velocity: decreased     General Gait Details: Limited distance due to fatigue and R hip discomfort with wt bearing   Stairs             Wheelchair Mobility     Tilt Bed    Modified Rankin (Stroke Patients Only)       Balance Overall balance assessment: Needs assistance Sitting-balance support: Feet supported Sitting balance-Leahy Scale: Good     Standing balance support: Reliant on assistive device for balance, During functional activity, Bilateral upper extremity supported Standing balance-Leahy Scale: Fair Standing balance comment: No LOB during ambulation.                            Cognition Arousal/Alertness: Awake/alert Behavior During Therapy: WFL for tasks assessed/performed Overall Cognitive Status: Within Functional Limits for tasks assessed                         Following Commands: Follows one step commands consistently       General Comments: Pleasant, cooperative, able to follow commands and respond appropriately        Exercises General Exercises - Lower Extremity Ankle Circles/Pumps: AROM, Both, 10 reps, Seated Long Arc Quad: AROM, Both, 10 reps, Seated Other Exercises Other Exercises: Pt educated on  HEP with good understanding    General Comments General comments (skin integrity, edema, etc.):  (Pt educated on role of PT, benefits of STR, Right hip Posterior precautions and exercises with good understanding)      Pertinent Vitals/Pain Pain Assessment Pain Assessment: 0-10 Pain Score: 6  Pain Location: R hip during mobility Pain Descriptors / Indicators: Grimacing, Sore Pain Intervention(s): Limited  activity within patient's tolerance    Home Living                          Prior Function            PT Goals (current goals can now be found in the care plan section) Acute Rehab PT Goals Patient Stated Goal: get better Progress towards PT goals: Progressing toward goals    Frequency    7X/week      PT Plan Current plan remains appropriate    Co-evaluation              AM-PAC PT "6 Clicks" Mobility   Outcome Measure  Help needed turning from your back to your side while in a flat bed without using bedrails?: A Little Help needed moving from lying on your back to sitting on the side of a flat bed without using bedrails?: A Little Help needed moving to and from a bed to a chair (including a wheelchair)?: A Little Help needed standing up from a chair using your arms (e.g., wheelchair or bedside chair)?: A Little Help needed to walk in hospital room?: A Little Help needed climbing 3-5 steps with a railing? : A Little 6 Click Score: 18    End of Session Equipment Utilized During Treatment: Gait belt Activity Tolerance: Patient tolerated treatment well;Patient limited by fatigue Patient left: in chair;with call bell/phone within reach;with chair alarm set Nurse Communication: Mobility status PT Visit Diagnosis: Unsteadiness on feet (R26.81);Difficulty in walking, not elsewhere classified (R26.2);Pain;Muscle weakness (generalized) (M62.81) Pain - Right/Left: Right Pain - part of body: Hip     Time: 9528-4132 PT Time Calculation (min) (ACUTE ONLY): 43 min  Charges:    $Gait Training: 8-22 mins $Therapeutic Exercise: 8-22 mins $Therapeutic Activity: 8-22 mins PT General Charges $$ ACUTE PT VISIT: 1 Visit                     Zadie Cleverly, PTA  Jannet Askew 03/04/2023, 11:23 AM

## 2023-03-04 NOTE — Progress Notes (Signed)
   Subjective: 4 Days Post-Op Procedure(s) (LRB): TOTAL HIP ARTHROPLASTY REVISION WITH CONVERSION TO CONSTRAINED LINER (Right) Patient reports pain as mild.  Patient is well, and has had no acute complaints or problems We will continue therapy today.  Plan is to go Skilled nursing facility after hospital stay.  Objective: Vital signs in last 24 hours: Temp:  [97.6 F (36.4 C)-98.2 F (36.8 C)] 98.2 F (36.8 C) (08/05 0043) Pulse Rate:  [65-78] 78 (08/05 0043) Resp:  [14-22] 22 (08/05 0043) BP: (113-137)/(45-64) 116/45 (08/05 0043) SpO2:  [98 %-100 %] 98 % (08/05 0043)  Intake/Output from previous day: 08/04 0701 - 08/05 0700 In: 240 [P.O.:240] Out: 0  Intake/Output this shift: No intake/output data recorded.  Recent Labs    03/02/23 0423 03/03/23 1013  HGB 11.6* 10.6*   Recent Labs    03/02/23 0423 03/03/23 1013  WBC 11.3* 8.6  RBC 4.10 3.69*  HCT 36.1 32.3*  PLT 296 288   Recent Labs    03/02/23 0422 03/03/23 0626  NA 138 139  K 3.8 4.1  CL 107 106  CO2 25 26  BUN 22 22  CREATININE 0.64 0.57  GLUCOSE 116* 89  CALCIUM 9.1 9.3   No results for input(s): "LABPT", "INR" in the last 72 hours.  EXAM General - Patient is Alert and Appropriate.  Extremity - Neurovascular intact Sensation intact distally Intact pulses distally Dorsiflexion/Plantar flexion intact No cellulitis present Compartment soft Dressing - dressing C/D/I and no drainage Motor Function - intact, moving foot and toes well on exam.   Past Medical History:  Diagnosis Date   Cancer of sigmoid colon (HCC) 12/08/2014   Colonic mass 10/07/2012   Overview:  Gets routine colonoscopies. Dx was 2013.   DDD (degenerative disc disease), lumbar 12/16/2014   Diverticulitis 06/27/2011   Facet syndrome, lumbar 12/16/2014   GERD (gastroesophageal reflux disease) 05/26/2012   Insomnia 06/18/2016   Last Assessment & Plan:  Relevant Hx: Course: Daily Update: Today's Plan:   Lesion of bladder  04/29/2012   Lumbar radiculopathy 10/04/2015   Sacroiliac joint dysfunction of both sides 12/16/2014   Status post total replacement of right hip 08/15/2015    Assessment/Plan:   4 Days Post-Op Procedure(s) (LRB): TOTAL HIP ARTHROPLASTY REVISION WITH CONVERSION TO CONSTRAINED LINER (Right) Principal Problem:   Acute delirium Active Problems:   DDD (degenerative disc disease), lumbar   Essential hypertension   Postoperative anemia   Leukocytosis   Recurrent dislocation of right hip   Constipation  Estimated body mass index is 18.3 kg/m as calculated from the following:   Height as of this encounter: 5\' 5"  (1.651 m).   Weight as of this encounter: 49.9 kg. Advance diet Up with therapy, weightbearing as tolerated.  Posterior hip precautions Vital signs are stable Labs are stable Pain well-controlled Care management to assist with discharge to skilled nursing facility   Follow-up with [facility] orthopedics in 2 weeks Lovenox 40 mg subcu daily x 14 days at discharge.  DVT Prophylaxis - Lovenox, TED hose, and SCDs Weight-Bearing as tolerated to right leg   T. Cranston Neighbor, PA-C [facility] 03/04/2023, 7:29 AM

## 2023-03-04 NOTE — TOC Progression Note (Signed)
Transition of Care [facility]) - Progression Note    Patient Details  Name: Marilyn Romero MRN: [id number] Date of Birth: Jul 17, 1934  Transition of Care [facility]) CM/SW Contact  Marlowe Sax, RN Phone Number: 03/04/2023, 10:13 AM  Clinical Narrative:    [facility] Portal< ins [place] [id number]   Expected Discharge Plan: Skilled Nursing Facility Barriers to Discharge: Continued Medical Work up  Expected Discharge Plan and Services                                               Social Determinants of Health (SDOH) Interventions SDOH Screenings   Food Insecurity: Patient Declined (02/26/2023)  Housing: Patient Declined (02/26/2023)  Recent Concern: Housing - High Risk (01/24/2023)  Transportation Needs: Patient Declined (02/26/2023)  Utilities: Patient Declined (02/26/2023)  Depression (PHQ2-9): Low Risk  (06/27/2022)  Tobacco Use: Low Risk  (02/28/2023)    Readmission Risk Interventions    02/26/2023    8:39 AM  Readmission Risk Prevention Plan  Transportation Screening Complete  PCP or Specialist Appt within 3-5 Days Complete  HRI or Home Care Consult Complete  Social Work Consult for Recovery Care Planning/Counseling Complete  Palliative Care Screening Not Applicable  Medication Review Ocean[facility]) Referral to Pharmacy

## 2023-03-04 NOTE — Plan of Care (Signed)
  Problem: Clinical Measurements: Goal: Ability to maintain clinical measurements within normal limits will improve Outcome: Progressing Goal: Will remain free from infection Outcome: Progressing Goal: Diagnostic test results will improve Outcome: Progressing Goal: Respiratory complications will improve Outcome: Progressing Goal: Cardiovascular complication will be avoided Outcome: Progressing   Problem: Activity: Goal: Risk for activity intolerance will decrease Outcome: Progressing   Problem: Nutrition: Goal: Adequate nutrition will be maintained Outcome: Progressing   Problem: Coping: Goal: Level of anxiety will decrease Outcome: Progressing   Problem: Elimination: Goal: Will not experience complications related to urinary retention Outcome: Progressing   Problem: Pain Managment: Goal: General experience of comfort will improve Outcome: Progressing   Problem: Safety: Goal: Ability to remain free from injury will improve Outcome: Progressing   Problem: Skin Integrity: Goal: Risk for impaired skin integrity will decrease Outcome: Progressing   Problem: Activity: Goal: Ability to ambulate and perform ADLs will improve Outcome: Progressing   Problem: Clinical Measurements: Goal: Postoperative complications will be avoided or minimized Outcome: Progressing   Problem: Pain Management: Goal: Pain level will decrease Outcome: Progressing

## 2023-03-04 NOTE — Progress Notes (Signed)
Tele-sitter initially ordered by off-going shift, however pt has been easily redirectable since change of shift. Pt is calm and cooperative. Order not implemented, same discontinued.

## 2023-03-04 NOTE — Discharge Summary (Signed)
Physician Discharge Summary   Patient: Marilyn Romero MRN: [id number] DOB: [birth year]  Admit date:     02/26/2023  Discharge date: 03/04/23  Discharge Physician: Alford Highland   PCP: Leim Fabry, MD   Recommendations at discharge:   Follow-up team at rehab 1 day Follow-up orthopedic surgery 2 weeks  Discharge Diagnoses: Principal Problem:   Acute delirium Active Problems:   Recurrent dislocation of right hip   Essential hypertension   DDD (degenerative disc disease), lumbar   Postoperative anemia   Leukocytosis   Constipation   Right arm weakness    Hospital Course: 89 y.o. Caucasian female with medical history significant for GERD, diverticulitis, lumbar radiculopathy, and essential hypertension, who presented to the emergency room l with right hip nontraumatic dislocation.  This is the fifth time patient had her prosthetic right hip joint dislocation. Closed reduction in the OR is performed on 7/30.  Revision of the right hip done 8/1.  8/2.  Patient is delirious this morning after surgery.  Did not sleep very well last night.  Patient not complaining of pain. 8/3.  Patient slept better last night with Seroquel.  Having more pain in her right hip today.  Did not walk as far with physical therapy today.  Seen while sitting in the chair. 8/4.  Patient asking a lot of questions today and was confused what happened.  I went over hospital course with her and asked her to remember things from now moving forward. 8/5.  Patient had a good night sleep with Seroquel.  Was seen sitting up eating breakfast.  Still having some pain in the right leg.  Had a large bowel movement today.  Patient having weakness on the right arm been going on for a while.  CT head negative for stroke.  Assessment and Plan: * Acute delirium Likely secondary to being in an unfamiliar environments, anesthesia and potentially pain medication.  Mental status better today with Seroquel at night.  Will make as  needed for insomnia or agitation at the facility.  Recurrent dislocation of right hip Patient and close posterior right prosthetic hip reduction on 7/30.  Hip revision surgery on 8/1.  Pain control.  Patient had a large bowel movement today.  Continue MiraLAX.  Lovenox injections daily for 14 days then stop.  Follow-up with orthopedics in 2 weeks.  Essential hypertension With blood pressure being on the lower side will hold lisinopril.  DDD (degenerative disc disease), lumbar Continue working with physical therapy.  Right arm weakness As per patient and daughter this has been going on for a while.  CT scan of the head negative.  Continue PT and OT at rehab.  Constipation Patient had a large bowel movement today.  Continue MiraLAX.  Leukocytosis Likely reactive after surgery.  Now within normal range  Postoperative anemia Hemoglobin drifted down to 10.6.         Consultants: Orthopedic surgery Procedures performed: Right hip dislocation reduction, right total hip arthroplasty revision Disposition: Rehabilitation facility Diet recommendation:  Cardiac diet DISCHARGE MEDICATION: Allergies as of 03/04/2023   No Known Allergies      Medication List     STOP taking these medications    diclofenac Sodium 1 % Gel Commonly known as: VOLTAREN   ibuprofen 800 MG tablet Commonly known as: ADVIL   Lidocaine Pain Relief 4 % Generic drug: lidocaine   lisinopril 10 MG tablet Commonly known as: ZESTRIL       TAKE these medications    acetaminophen 325 MG  tablet Commonly known as: TYLENOL Take 1-2 tablets (325-650 mg total) by mouth every 6 (six) hours as needed for mild pain or moderate pain (pain score 1-3 or temp > 100.5). What changed:  how much to take when to take this reasons to take this   Cerovite Senior Tabs Take 1 tablet by mouth daily.   enoxaparin 40 MG/0.4ML injection Commonly known as: LOVENOX Inject 0.4 mLs (40 mg total) into the skin daily for  14 days.   ferrous sulfate 325 (65 FE) MG tablet Take 325 mg by mouth daily with breakfast.   guaiFENesin 100 MG/5ML liquid Commonly known as: ROBITUSSIN Take 15 mLs by mouth every 6 (six) hours as needed for cough.   HYDROcodone-acetaminophen 5-325 MG tablet Commonly known as: NORCO/VICODIN Take 1 tablet by mouth every 6 (six) hours as needed.   magnesium hydroxide 400 MG/5ML suspension Commonly known as: MILK OF MAGNESIA Take 30 mLs by mouth daily as needed for mild constipation.   mirtazapine 7.5 MG tablet Commonly known as: REMERON Take 7.5 mg by mouth at bedtime.   polyethylene glycol 17 g packet Commonly known as: MIRALAX / GLYCOLAX Take 17 g by mouth daily. Start taking on: March 05, 2023   QUEtiapine 25 MG tablet Commonly known as: SEROQUEL Take 0.5 tablets (12.5 mg total) by mouth at bedtime as needed (agitation or insomnia).        Contact information for follow-up providers     Reinaldo Berber, MD Follow up in 2 day(s).   Specialty: Orthopedic Surgery Contact information: [address] [place] [address] [phone]              Contact information for after-discharge care     Destination     [facility] .   Service: Skilled Nursing Contact information: [address] [place] [address] [phone]                    Discharge Exam: Ceasar Mons Weights   02/26/23 0739 02/28/23 1206  Weight: 57 kg 49.9 kg   Physical Exam HENT:     Head: Normocephalic.     Mouth/Throat:     Pharynx: No oropharyngeal exudate.  Eyes:     General: Lids are normal.     Conjunctiva/sclera: Conjunctivae normal.  Cardiovascular:     Rate and Rhythm: Normal rate and regular rhythm.     Heart sounds: S1 normal and S2 normal. Murmur heard.     Systolic murmur is present with a grade of 2/6.  Pulmonary:     Breath sounds: No decreased breath sounds, wheezing, rhonchi or rales.  Abdominal:      Palpations: Abdomen is soft.     Tenderness: There is no abdominal tenderness.  Musculoskeletal:     Right lower leg: No swelling.     Left lower leg: No swelling.  Skin:    General: Skin is warm.     Findings: No rash.  Neurological:     Mental Status: She is alert.     Comments: Patient able to lift knee up off the bed bilaterally.  Answers questions appropriately today.      Condition at discharge: stable  The results of significant diagnostics from this hospitalization (including imaging, microbiology, ancillary and laboratory) are listed below for reference.   Imaging Studies: CT HEAD WO CONTRAST ( )  Result Date: 03/04/2023 CLINICAL DATA:  Follow-up stroke, right arm weakness. EXAM: CT HEAD WITHOUT CONTRAST TECHNIQUE: Contiguous axial images were  obtained from the base of the skull through the vertex without intravenous contrast. RADIATION DOSE REDUCTION: This exam was performed according to the departmental dose-optimization program which includes automated exposure control, adjustment of the mA and/or kV according to patient size and/or use of iterative reconstruction technique. COMPARISON:  CT head 10/30/2022 FINDINGS: Brain: There is no acute intracranial hemorrhage, extra-axial fluid collection, or acute territorial infarct Parenchymal volume is within expected limits for age. The ventricles are stable in size. Gray-white differentiation is preserved. Patchy hypodensity in the supratentorial white matter is consistent with underlying chronic small-vessel ischemic change An empty sella is noted. The suprasellar region is normal. There is no mass lesion there is no mass effect or midline shift. Vascular: There is calcification of the bilateral carotid siphons. Skull: Normal. Negative for fracture or focal lesion. Sinuses/Orbits: The imaged paranasal sinuses are clear. Bilateral lens implants are in place. The globes and orbits are otherwise unremarkable. Other: The mastoid air cells  and middle ear cavities are clear. IMPRESSION: No acute intracranial pathology. Electronically Signed   By: Lesia Hausen M.D.   On: 03/04/2023 13:51   DG Chest Port 1 View  Result Date: 03/01/2023 CLINICAL DATA:  Leukocytosis EXAM: PORTABLE CHEST 1 VIEW COMPARISON:  01/02/2023 FINDINGS: Mild elevation of the right hemidiaphragm. Heart and mediastinal contours are within normal limits. No confluent airspace opacities or effusions. No acute bony abnormality. IMPRESSION: Stable chronic elevation of the right hemidiaphragm. No acute cardiopulmonary disease. Electronically Signed   By: Charlett Nose M.D.   On: 03/01/2023 17:32   DG Pelvis Portable  Result Date: 02/28/2023 CLINICAL DATA:  Elective surgery, intraoperative views obtained in the operating room. EXAM: PORTABLE PELVIS 1-2 VIEWS COMPARISON:  Preoperative exam FINDINGS: Two AP views of the pelvis obtained in the operating room. Acetabular cup is in place. Femoral head component is now seated in the acetabular component. Change in positioning of the presumed liner on the 2 provided views. IMPRESSION: Intraoperative imaging during right hip surgery. Electronically Signed   By: Narda Rutherford M.D.   On: 02/28/2023 18:28   DG C-Arm 1-60 Min-No Report  Result Date: 02/26/2023 Fluoroscopy was utilized by the requesting physician.  No radiographic interpretation.   DG Pelvis Portable  Result Date: 02/25/2023 CLINICAL DATA:  Status post reduction of right hip. EXAM: PORTABLE PELVIS 1-2 VIEWS COMPARISON:  Radiograph earlier today FINDINGS: The arthroplasty dislocation is not reduced. Persistent superior dislocation of the femoral component of right hip arthroplasty. Stable positioning of the acetabular cup. No fracture is seen. Distal aspect of the femoral stem is not included in this field of view. IMPRESSION: Persistent superior dislocation of the femoral component of right hip arthroplasty. Electronically Signed   By: Narda Rutherford M.D.   On:  02/25/2023 20:55   DG Hip Unilat W or Wo Pelvis 2-3 Views Right  Result Date: 02/25/2023 CLINICAL DATA:  Right hip dislocation, pain EXAM: DG HIP (WITH OR WITHOUT PELVIS) 2-3V RIGHT COMPARISON:  02/10/2023 FINDINGS: Frontal view of the pelvis as well as frontal and cross-table lateral views of the right hip are obtained. There is superior dislocation of the femoral component of the right hip arthroplasty relative to the acetabular component. No evidence of fracture. Bones are diffusely osteopenic. IMPRESSION: 1. Superior dislocation of the right hip arthroplasty. No acute fracture. Electronically Signed   By: Sharlet Salina M.D.   On: 02/25/2023 19:37   DG Pelvis Portable  Result Date: 02/10/2023 CLINICAL DATA:  Relocation EXAM: PORTABLE PELVIS 1-2 VIEWS COMPARISON:  Right hip x-ray 02/10/2023 FINDINGS: Right hip arthroplasty is now in anatomic alignment. No evidence for fracture or dislocation. There is a large amount of stool in the rectum. IMPRESSION: 1. Right hip arthroplasty is now in anatomic alignment. 2. Large stool burden in the rectum. Electronically Signed   By: Darliss Cheney M.D.   On: 02/10/2023 22:57   DG Hip Unilat With Pelvis 2-3 Views Right  Result Date: 02/10/2023 CLINICAL DATA:  Hip pain EXAM: DG HIP (WITH OR WITHOUT PELVIS) 2-3V RIGHT COMPARISON:  02/02/2023 FINDINGS: Right hip replacement. Superior dislocation of the femoral component. No fracture. Partially metallic support device over right hip. IMPRESSION: Superior dislocation of the femoral component of right hip replacement. Electronically Signed   By: Jasmine Pang M.D.   On: 02/10/2023 20:27    Microbiology: Results for orders placed or performed during the hospital encounter of 02/26/23  Aerobic/Anaerobic Culture w Gram Stain (surgical/deep wound)     Status: None (Preliminary result)   Collection Time: 02/28/23  2:38 PM   Specimen: Path Tissue  Result Value Ref Range Status   Specimen Description TISSUE RIGHT HIP   Final   Special Requests SYNOVIUM  Final   Gram Stain   Final    RARE WBC PRESENT, PREDOMINANTLY MONONUCLEAR NO ORGANISMS SEEN    Culture   Final    NO GROWTH 4 DAYS NO ANAEROBES ISOLATED; CULTURE IN PROGRESS FOR 5 DAYS Performed at [facility], [address], [place], [place] [address]    Report Status PENDING  Incomplete  Urine Culture     Status: None   Collection Time: 03/01/23  1:55 PM   Specimen: Urine, Clean Catch  Result Value Ref Range Status   Specimen Description   Final    URINE, CLEAN CATCH Performed at [facility], [address], [place], [place] [address]    Special Requests   Final    Normal Performed at [facility], [address] [place], [place] [address]    Culture   Final    NO GROWTH Performed at [facility], [address], [place], [place] [address]    Report Status 03/02/2023 FINAL  Final    Labs: CBC: Recent Labs  Lab 02/25/23 1857 02/26/23 0506 03/01/23 0521 03/02/23 0423 03/03/23 1013  WBC 7.7 8.6 17.3* 11.3* 8.6  NEUTROABS 5.3  --   --   --   --   HGB 12.6 11.8* 13.0 11.6* 10.6*  HCT 39.4 36.3 40.2 36.1 32.3*  MCV 90.6 89.0 88.0 88.0 87.5  PLT 343 292 268 296 288   Basic Metabolic Panel: Recent Labs  Lab 02/25/23 1857 02/26/23 0506 03/01/23 0521 03/02/23 0422 03/03/23 0626  NA 135 138 140 138 139  K 3.0* 4.1 4.6 3.8 4.1  CL 104 108 106 107 106  CO2 25 23 22 25 26   GLUCOSE 113* 101* 120* 116* 89  BUN 16 14 15 22 22   CREATININE 0.60 0.60 0.71 0.64 0.57  CALCIUM 9.0 8.9 9.4 9.1 9.3   Liver Function Tests: No results for input(s): "AST", "ALT", "ALKPHOS", "BILITOT", "PROT", "ALBUMIN" in the last 168 hours. CBG: No results for input(s): "GLUCAP" in the last 168 hours.  Discharge time spent: greater than 30 minutes.  Signed: Alford Highland, MD [facility] 03/04/2023

## 2023-03-04 NOTE — Plan of Care (Signed)
  Problem: Clinical Measurements: Goal: Ability to maintain clinical measurements within normal limits will improve Outcome: Progressing Goal: Will remain free from infection Outcome: Progressing Goal: Diagnostic test results will improve Outcome: Progressing Goal: Respiratory complications will improve Outcome: Progressing Goal: Cardiovascular complication will be avoided Outcome: Progressing   Problem: Activity: Goal: Risk for activity intolerance will decrease Outcome: Progressing   Problem: Nutrition: Goal: Adequate nutrition will be maintained Outcome: Progressing   Problem: Coping: Goal: Level of anxiety will decrease Outcome: Progressing   Problem: Elimination: Goal: Will not experience complications related to bowel motility Outcome: Progressing Goal: Will not experience complications related to urinary retention Outcome: Progressing   Problem: Pain Managment: Goal: General experience of comfort will improve Outcome: Progressing   Problem: Safety: Goal: Ability to remain free from injury will improve Outcome: Progressing   Problem: Skin Integrity: Goal: Risk for impaired skin integrity will decrease Outcome: Progressing   Problem: Activity: Goal: Ability to ambulate and perform ADLs will improve Outcome: Progressing   Problem: Clinical Measurements: Goal: Postoperative complications will be avoided or minimized Outcome: Progressing   Problem: Self-Concept: Goal: Ability to maintain and perform role responsibilities to the fullest extent possible will improve Outcome: Progressing   Problem: Pain Management: Goal: Pain level will decrease Outcome: Progressing

## 2023-03-10 ENCOUNTER — Encounter (HOSPITAL_COMMUNITY): Payer: Self-pay

## 2023-03-10 ENCOUNTER — Emergency Department (HOSPITAL_COMMUNITY)
Admission: EM | Admit: 2023-03-10 | Discharge: 2023-03-11 | Disposition: A | Discharge status: Rehabilitation Facility | Payer: 59 | Attending: Emergency Medicine | Admitting: Emergency Medicine

## 2023-03-10 ENCOUNTER — Other Ambulatory Visit: Payer: Self-pay

## 2023-03-10 ENCOUNTER — Emergency Department (HOSPITAL_COMMUNITY): Payer: 59

## 2023-03-10 DIAGNOSIS — R410 Disorientation, unspecified: Secondary | ICD-10-CM | POA: Insufficient documentation

## 2023-03-10 DIAGNOSIS — S0003XA Contusion of scalp, initial encounter: Secondary | ICD-10-CM | POA: Insufficient documentation

## 2023-03-10 DIAGNOSIS — S066X0A Traumatic subarachnoid hemorrhage without loss of consciousness, initial encounter: Secondary | ICD-10-CM | POA: Insufficient documentation

## 2023-03-10 DIAGNOSIS — Z79899 Other long term (current) drug therapy: Secondary | ICD-10-CM | POA: Diagnosis not present

## 2023-03-10 DIAGNOSIS — W01198A Fall on same level from slipping, tripping and stumbling with subsequent striking against other object, initial encounter: Secondary | ICD-10-CM | POA: Diagnosis not present

## 2023-03-10 DIAGNOSIS — S0990XA Unspecified injury of head, initial encounter: Secondary | ICD-10-CM | POA: Diagnosis present

## 2023-03-10 DIAGNOSIS — W19XXXA Unspecified fall, initial encounter: Secondary | ICD-10-CM

## 2023-03-10 DIAGNOSIS — Z85038 Personal history of other malignant neoplasm of large intestine: Secondary | ICD-10-CM | POA: Diagnosis not present

## 2023-03-10 DIAGNOSIS — I10 Essential (primary) hypertension: Secondary | ICD-10-CM | POA: Diagnosis not present

## 2023-03-10 DIAGNOSIS — I609 Nontraumatic subarachnoid hemorrhage, unspecified: Secondary | ICD-10-CM

## 2023-03-10 DIAGNOSIS — R531 Weakness: Secondary | ICD-10-CM | POA: Insufficient documentation

## 2023-03-10 LAB — CBC
HCT: 36.5 % (ref 36.0–46.0)
Hemoglobin: 11.8 g/dL — ABNORMAL LOW (ref 12.0–15.0)
MCH: 29.4 pg (ref 26.0–34.0)
MCHC: 32.3 g/dL (ref 30.0–36.0)
MCV: 91 fL (ref 80.0–100.0)
Platelets: 564 10*3/uL — ABNORMAL HIGH (ref 150–400)
RBC: 4.01 MIL/uL (ref 3.87–5.11)
RDW: 13.7 % (ref 11.5–15.5)
WBC: 11.4 10*3/uL — ABNORMAL HIGH (ref 4.0–10.5)
nRBC: 0 % (ref 0.0–0.2)

## 2023-03-10 LAB — BASIC METABOLIC PANEL
Anion gap: 12 (ref 5–15)
BUN: 15 mg/dL (ref 8–23)
CO2: 23 mmol/L (ref 22–32)
Calcium: 9.6 mg/dL (ref 8.9–10.3)
Chloride: 100 mmol/L (ref 98–111)
Creatinine, Ser: 0.61 mg/dL (ref 0.44–1.00)
GFR, Estimated: >60 mL/min (ref >60)
Glucose, Bld: 131 mg/dL — ABNORMAL HIGH (ref 70–99)
Potassium: 4 mmol/L (ref 3.5–5.1)
Sodium: 135 mmol/L (ref 135–145)

## 2023-03-10 LAB — LITHIUM LEVEL: Lithium Lvl: 0.1 mmol/L — ABNORMAL LOW (ref 0.60–1.20)

## 2023-03-10 NOTE — ED Provider Notes (Signed)
[place] EMERGENCY DEPARTMENT AT [facility] Provider Note   CSN: [id number] Arrival date & time: 03/10/23  0349     History  Chief Complaint  Patient presents with   Fall   Level 5 caveat due to altered mental status Marilyn Romero is a 89 y.o. female.   Fall  Patient with history of hypertension, recent hip surgery presents after fall.  Nursing home reports the patient fell striking her head. Patient reports headache and dizziness.  No other acute complaints Patient is on Lovenox Patient is a DNR   Past Medical History:  Diagnosis Date   Cancer of sigmoid colon (HCC) 12/08/2014   Colonic mass 10/07/2012   Overview:  Gets routine colonoscopies. Dx was 2013.   DDD (degenerative disc disease), lumbar 12/16/2014   Diverticulitis 06/27/2011   Facet syndrome, lumbar 12/16/2014   GERD (gastroesophageal reflux disease) 05/26/2012   Insomnia 06/18/2016   Last Assessment & Plan:  Relevant Hx: Course: Daily Update: Today's Plan:   Lesion of bladder 04/29/2012   Lumbar radiculopathy 10/04/2015   Sacroiliac joint dysfunction of both sides 12/16/2014   Status post total replacement of right hip 08/15/2015    Home Medications Prior to Admission medications   Medication Sig Start Date End Date Taking? Authorizing Provider  acetaminophen (TYLENOL) 325 MG tablet Take 1-2 tablets (325-650 mg total) by mouth every 6 (six) hours as needed for mild pain or moderate pain (pain score 1-3 or temp > 100.5). 03/04/23   Evon Slack, PA-C  ferrous sulfate 325 (65 FE) MG tablet Take 325 mg by mouth daily with breakfast.    Bryson Corona, NP  guaiFENesin (ROBITUSSIN) 100 MG/5ML liquid Take 15 mLs by mouth every 6 (six) hours as needed for cough.    Keane Police, MD  HYDROcodone-acetaminophen (NORCO/VICODIN) 5-325 MG tablet Take 1 tablet by mouth every 6 (six) hours as needed. 03/04/23   Alford Highland, MD  magnesium hydroxide (MILK OF MAGNESIA) 400 MG/5ML suspension Take 30  mLs by mouth daily as needed for mild constipation.    [provider]  mirtazapine (REMERON) 7.5 MG tablet Take 7.5 mg by mouth at bedtime. 02/12/23   [provider]  Multiple Vitamins-Minerals (CEROVITE SENIOR) TABS Take 1 tablet by mouth daily. Patient not taking: Reported on 02/26/2023    Keane Police, MD  polyethylene glycol (MIRALAX / GLYCOLAX) 17 g packet Take 17 g by mouth daily. 03/05/23   Alford Highland, MD  QUEtiapine (SEROQUEL) 25 MG tablet Take 0.5 tablets (12.5 mg total) by mouth at bedtime as needed (agitation or insomnia). 03/04/23   Alford Highland, MD      Allergies    Patient has no known allergies.    Review of Systems   Review of Systems  Unable to perform ROS: Mental status change    Physical Exam Updated Vital Signs BP (!) 115/98   Pulse 91   Temp 98.6 F (37 C) (Axillary)   Resp (!) 35   Wt 49.8 kg   SpO2 98%   BMI 18.27 kg/m  Physical Exam CONSTITUTIONAL: Elderly and frail HEAD: Hematoma to posterior scalp no other signs of trauma EYES: EOMI/PERRL ENMT: Mucous membranes moist, no visible trauma NECK: supple no meningeal signs SPINE/BACK:entire spine nontender No bruising/crepitance/stepoffs noted to spine CV: S1/S2 noted LUNGS: no apparent distress Chest - no tenderness or bruising noted ABDOMEN: soft, nontender, no bruising NEURO: Pt is awake/alert,she appears confused (chronic weakness in right UE noted) EXTREMITIES: Pelvis appears stable.  Bandage noted to the right hip from recent surgery.  No deformities to lower extremities.  She is able to lift both legs No deformities to upper extremities SKIN: warm, color normal   ED Results / Procedures / Treatments   Labs (all labs ordered are listed, but only abnormal results are displayed) Labs Reviewed  CBC - Abnormal; Notable for the following components:      Result Value   WBC 11.4 (*)    Hemoglobin 11.8 (*)    Platelets 564 (*)    All other components within  normal limits  BASIC METABOLIC PANEL - Abnormal; Notable for the following components:   Glucose, Bld 131 (*)    All other components within normal limits  LITHIUM LEVEL - Abnormal; Notable for the following components:   Lithium Lvl 0.10 (*)    All other components within normal limits    EKG EKG Interpretation Date/Time:  Sunday March 10 2023 04:04:30 EDT Ventricular Rate:  92 PR Interval:  176 QRS Duration:  84 QT Interval:  349 QTC Calculation: 432 R Axis:   -2  Text Interpretation: Sinus rhythm Left atrial enlargement Borderline low voltage, extremity leads No significant change since last tracing Confirmed by Zadie Rhine ([id number]) on 03/10/2023 4:18:01 AM  Radiology CT Head Wo Contrast  Addendum Date: 03/10/2023   ADDENDUM REPORT: 03/10/2023 05:17 ADDENDUM: Study discussed by telephone with Dr. Zadie Rhine on 03/10/2023 at 0509 hours. Electronically Signed   By: Odessa Fleming M.D.   On: 03/10/2023 05:17   Result Date: 03/10/2023 CLINICAL DATA:  89 year old female status post unwitnessed fall on Lovenox. Posterior head laceration. EXAM: CT HEAD WITHOUT CONTRAST TECHNIQUE: Contiguous axial images were obtained from the base of the skull through the vertex without intravenous contrast. RADIATION DOSE REDUCTION: This exam was performed according to the departmental dose-optimization program which includes automated exposure control, adjustment of the mA and/or kV according to patient size and/or use of iterative reconstruction technique. COMPARISON:  Brain MRI 05/16/2019.  Recent head CT 03/04/2023. FINDINGS: Brain: Stable cerebral volume. No midline shift, mass effect, or evidence of intracranial mass lesion. No ventriculomegaly. No intraventricular hemorrhage. Trace left superior frontal gyrus subarachnoid hemorrhage, single sulcus involvement on coronal image 40, compare to previous coronal image 34 this month. No subdural or other extra-axial, intra-axial hemorrhage identified. Stable  gray-white matter differentiation throughout the brain. No cortically based acute infarct identified. Vascular: No suspicious intracranial vascular hyperdensity. Calcified atherosclerosis at the skull base. Skull: Stable.  No fracture identified. Sinuses/Orbits: Visualized paranasal sinuses and mastoids are clear. Other: Right posterior convexity scalp hematoma, up to 10 mm in thickness on series 6, image 52. Underlying calvarium appears intact. Trace scalp soft tissue gas there. Orbits appear stable, negative. ---------------------------------------------------- TRAUMATIC BRAIN INJURY RISK STRATIFICATION: TRAUMATIC BRAIN INJURY RISK STRATIFICATION Skull Fracture: No - Low/mBIG 1 Subdural Hematoma (SDH): No - Low Subarachnoid Hemorrhage [facility]): trace (up to 2 sulci) - Low/mBIG 1 Epidural Hematoma (EDH): No - Low or Moderate; mBIG 1 or 2 Cerebral contusion, intra-axial, intraparenchymal Hemorrhage (IPH): No Intraventricular Hemorrhage (IVH): No - Low or Moderate; mBIG 1 or 2 Midline Shift > 1mm or Edema/effacement of sulci/vents: No - Low or Moderate; mBIG 1 or 2 ---------------------------------------------------- IMPRESSION: 1. Trace left frontal lobe subarachnoid hemorrhage. No other acute intracranial abnormality. 2. Right posterior scalp hematoma with no skull fracture identified. Electronically Signed: By: Odessa Fleming M.D. On: 03/10/2023 05:05   DG Pelvis 1-2 Views  Result Date: 03/10/2023 CLINICAL DATA:  89 year old female status  post unwitnessed fall on Lovenox. Posterior head laceration. EXAM: PELVIS - 1-2 VIEW COMPARISON:  Pelvis radiographs 02/28/2023. CT Abdomen and Pelvis 12/08/2021. FINDINGS: Portable AP supine view at 0436 hours. Chronic right total hip arthroplasty. This appears revised since last year. Not all of the right femoral hardware is included, but the visible hardware appears stable since 02/28/2023. Left femoral head remains normally located. Pelvis appears stable and intact. Nonobstructed  bowel-gas pattern with retained stool in the rectum. No acute osseous abnormality identified. IMPRESSION: No acute fracture or dislocation identified about the pelvis. Right total hip arthroplasty. Electronically Signed   By: Odessa Fleming M.D.   On: 03/10/2023 04:59    Procedures Procedures    Medications Ordered in ED Medications - No data to display  ED Course/ Medical Decision Making/ A&P Clinical Course as of 03/10/23 6387  [facility] Mar 10, 2023  5643 Patient presents from nursing facility after mechanical fall.  She was found to have a hematoma to her scalp.  Patient with history of dementia.  CT imaging shows trace subarachnoid hemorrhage.  I discussed this with radiology.  Patient is on Lovenox.  I have confirmed with the nursing home that her last dose of Lovenox was 8 AM on August 10.  Will consult neurosurgery [DW]  0529 WBC(!): 11.4 Mild leukocytosis [DW]  0529 Of note, patient was supposed  to be on Lovenox postoperatively for 14 days after recent right hip revision.  End date would be around August 14 [DW]  0541 D/w nsgy sara tomlinson We reviewed imaging Size of SAH is very small Recommend repeat CT head in 6 hrs If no changes she can be discharged Will STOP lovenox  [DW]  0545 Attempted to call daughter-in-law and son-in-law, but no answer [DW]  [phone] Patient's mental status appears to be improved.  She has no new complaints.  She has no focal abdominal tenderness or chest pain.  Advised that we will need to wait for repeat CT head [DW]  [phone] Signed out to Dr. Charm Barges with lithium level and CT head @10am  pendng.  If negative she can discharged and STOP lovenox  [DW]    Clinical Course User Index [DW] Zadie Rhine, MD                                 Medical Decision Making Amount and/or Complexity of Data Reviewed Labs: ordered. Decision-making details documented in ED Course. Radiology: ordered.   This patient presents to the ED for concern of head trauma, this involves  an extensive number of treatment options, and is a complaint that carries with it a high risk of complications and morbidity.  The differential diagnosis includes but is not limited to subdural hematoma, subarachnoid hemorrhage, skull fracture, concussion  Comorbidities that complicate the patient evaluation: Patient's presentation is complicated by their history of recent hip surgery  Social Determinants of Health: Patient's  poor mobility   increases the complexity of managing their presentation  Additional history obtained: Additional history obtained from nursing home/care facility Records reviewed previous admission documents  Lab Tests: I Ordered, and personally interpreted labs.  The pertinent results include:  labs overall unremarkable  Imaging Studies ordered: I ordered imaging studies including CT scan head   I independently visualized and interpreted imaging which showed small subarachnoid hemorrhage I agree with the radiologist interpretation  Cardiac Monitoring: The patient was maintained on a cardiac monitor.  I personally viewed and interpreted  the cardiac monitor which showed an underlying rhythm of:  sinus rhythm  Consultations Obtained: I requested consultation with the consultant nsgy APP , and discussed  findings as well as pertinent plan - they recommend: repeat CT head in 6 hrs  Reevaluation: After the interventions noted above, I reevaluated the patient and found that they have :stayed the same  Complexity of problems addressed: Patient's presentation is most consistent with  acute presentation with potential threat to life or bodily function             Final Clinical Impression(s) / ED Diagnoses Final diagnoses:  SAH (subarachnoid hemorrhage) (HCC)    Rx / DC Orders ED Discharge Orders     None         Zadie Rhine, MD 03/10/23 [phone]

## 2023-03-10 NOTE — ED Notes (Signed)
Attempted to call report to [facility] rehabilitation x2. No answer.

## 2023-03-10 NOTE — ED Notes (Signed)
Offered to help feed pt her meal tray and with a drink. Pt refuses at this time.

## 2023-03-10 NOTE — ED Provider Notes (Signed)
Signout from Dr. Bebe Shaggy.  89 year old female on Lovenox for DVT prophylaxis recent hip surgery here after fall at her facility hit her head.  She has small subarachnoid hemorrhage.  Plan is repeat CT 10 AM.  If progressing will need to involve neurosurgery.  Otherwise can return back to her facility, hold Lovenox. Physical Exam  BP (!) 106/55   Pulse 91   Temp 98.6 F (37 C) (Axillary)   Resp (!) 27   Wt 49.8 kg   SpO2 98%   BMI 18.27 kg/m   Physical Exam  Procedures  Procedures  ED Course / MDM   Clinical Course as of 03/10/23 0701  Sun Mar 10, 2023  6440 Patient presents from nursing facility after mechanical fall.  She was found to have a hematoma to her scalp.  Patient with history of dementia.  CT imaging shows trace subarachnoid hemorrhage.  I discussed this with radiology.  Patient is on Lovenox.  I have confirmed with the nursing home that her last dose of Lovenox was 8 AM on August 10.  Will consult neurosurgery [DW]  0529 WBC(!): 11.4 Mild leukocytosis [DW]  0529 Of note, patient was supposed  to be on Lovenox postoperatively for 14 days after recent right hip revision.  End date would be around August 14 [DW]  0541 D/w nsgy sara tomlinson We reviewed imaging Size of SAH is very small Recommend repeat CT head in 6 hrs If no changes she can be discharged Will STOP lovenox  [DW]  0545 Attempted to call daughter-in-law and son-in-law, but no answer [DW]  [phone] Patient's mental status appears to be improved.  She has no new complaints.  She has no focal abdominal tenderness or chest pain.  Advised that we will need to wait for repeat CT head [DW]  [phone] Signed out to Dr. Charm Barges with lithium level and CT head @10am  pendng.  If negative she can discharged and STOP lovenox  [DW]    Clinical Course User Index [DW] Zadie Rhine, MD   Medical Decision Making Amount and/or Complexity of Data Reviewed Labs: ordered. Decision-making details documented in ED  Course. Radiology: ordered.   Repeat head CT does not show any progression of bleeding.  No family at bedside.  Will return patient back to her facility.       Terrilee Files, MD 03/10/23 [phone]

## 2023-03-10 NOTE — ED Notes (Signed)
Discussed with [facility] about possibly taking pt back to the facility since she is discharged. Per [facility] they have no trucks available and can not take the pt.

## 2023-03-10 NOTE — ED Triage Notes (Addendum)
Pt bib [facility] from [facility] for unwitnessed fall, unknown LOC. Pt has laceration to back of head, bleeding controlled at this time. Pt is on Lovenox. C collar applied by EMS

## 2023-03-10 NOTE — Discharge Instructions (Addendum)
Please stop your Lovenox due to the head bleed.  You were seen in the emergency department for evaluation of injuries from a fall.  On your CAT scan of your head you had a small amount of subarachnoid bleeding.  Your CAT scan was repeated in 6 hours with no progression of the bleeding.  Neurosurgery felt you could be discharged back to your facility, but will be important for you to stop Lovenox.  Please return to the emergency department if any increased confusion or altered mental status.

## 2023-03-11 NOTE — ED Notes (Signed)
Attempted to call [facility] without success.

## 2023-03-15 ENCOUNTER — Encounter: Payer: Self-pay | Admitting: Surgery

## 2023-12-29 DEATH — deceased
